# Patient Record
Sex: Male | Born: 1960 | Race: White | Hispanic: No | Marital: Married | Smoking: Never smoker
Health system: Southern US, Community
[De-identification: ages and names within clinical notes are randomized; demographics above are authoritative.]

## PROBLEM LIST (undated history)

## (undated) DIAGNOSIS — K760 Fatty (change of) liver, not elsewhere classified: Secondary | ICD-10-CM

## (undated) DIAGNOSIS — E785 Hyperlipidemia, unspecified: Secondary | ICD-10-CM

## (undated) DIAGNOSIS — K219 Gastro-esophageal reflux disease without esophagitis: Secondary | ICD-10-CM

## (undated) DIAGNOSIS — F32A Depression, unspecified: Secondary | ICD-10-CM

## (undated) DIAGNOSIS — T7840XA Allergy, unspecified, initial encounter: Secondary | ICD-10-CM

## (undated) DIAGNOSIS — K649 Unspecified hemorrhoids: Secondary | ICD-10-CM

## (undated) DIAGNOSIS — J302 Other seasonal allergic rhinitis: Secondary | ICD-10-CM

## (undated) DIAGNOSIS — R945 Abnormal results of liver function studies: Secondary | ICD-10-CM

## (undated) DIAGNOSIS — I10 Essential (primary) hypertension: Secondary | ICD-10-CM

## (undated) DIAGNOSIS — F329 Major depressive disorder, single episode, unspecified: Secondary | ICD-10-CM

## (undated) DIAGNOSIS — E119 Type 2 diabetes mellitus without complications: Secondary | ICD-10-CM

## (undated) DIAGNOSIS — K449 Diaphragmatic hernia without obstruction or gangrene: Secondary | ICD-10-CM

## (undated) HISTORY — DX: Fatty (change of) liver, not elsewhere classified: K76.0

## (undated) HISTORY — DX: Gastro-esophageal reflux disease without esophagitis: K21.9

## (undated) HISTORY — DX: Other seasonal allergic rhinitis: J30.2

## (undated) HISTORY — DX: Hyperlipidemia, unspecified: E78.5

## (undated) HISTORY — DX: Type 2 diabetes mellitus without complications: E11.9

## (undated) HISTORY — DX: Diaphragmatic hernia without obstruction or gangrene: K44.9

## (undated) HISTORY — DX: Unspecified hemorrhoids: K64.9

## (undated) HISTORY — DX: Allergy, unspecified, initial encounter: T78.40XA

## (undated) HISTORY — DX: Abnormal results of liver function studies: R94.5

## (undated) HISTORY — DX: Depression, unspecified: F32.A

## (undated) HISTORY — PX: APPENDECTOMY: SHX54

## (undated) HISTORY — DX: Essential (primary) hypertension: I10

## (undated) HISTORY — DX: Major depressive disorder, single episode, unspecified: F32.9

---

## 2000-10-08 ENCOUNTER — Emergency Department (HOSPITAL_COMMUNITY): Admission: EM | Admit: 2000-10-08 | Discharge: 2000-10-09 | Payer: Self-pay | Admitting: Emergency Medicine

## 2000-10-09 ENCOUNTER — Emergency Department (HOSPITAL_COMMUNITY): Admission: EM | Admit: 2000-10-09 | Discharge: 2000-10-09 | Payer: Self-pay | Admitting: Emergency Medicine

## 2000-10-10 ENCOUNTER — Emergency Department (HOSPITAL_COMMUNITY): Admission: EM | Admit: 2000-10-10 | Discharge: 2000-10-10 | Payer: Self-pay | Admitting: Emergency Medicine

## 2000-10-13 ENCOUNTER — Ambulatory Visit (HOSPITAL_BASED_OUTPATIENT_CLINIC_OR_DEPARTMENT_OTHER): Admission: RE | Admit: 2000-10-13 | Discharge: 2000-10-13 | Payer: Self-pay | Admitting: Otolaryngology

## 2000-10-25 ENCOUNTER — Emergency Department (HOSPITAL_COMMUNITY): Admission: EM | Admit: 2000-10-25 | Discharge: 2000-10-25 | Payer: Self-pay | Admitting: Emergency Medicine

## 2002-11-15 ENCOUNTER — Encounter: Payer: Self-pay | Admitting: General Surgery

## 2002-11-15 ENCOUNTER — Inpatient Hospital Stay (HOSPITAL_COMMUNITY): Admission: EM | Admit: 2002-11-15 | Discharge: 2002-11-16 | Payer: Self-pay | Admitting: Emergency Medicine

## 2002-11-15 ENCOUNTER — Encounter (INDEPENDENT_AMBULATORY_CARE_PROVIDER_SITE_OTHER): Payer: Self-pay

## 2004-10-10 ENCOUNTER — Ambulatory Visit: Payer: Self-pay | Admitting: Internal Medicine

## 2005-03-28 ENCOUNTER — Ambulatory Visit: Payer: Self-pay | Admitting: Internal Medicine

## 2005-09-03 ENCOUNTER — Ambulatory Visit: Payer: Self-pay | Admitting: Cardiology

## 2005-09-03 ENCOUNTER — Ambulatory Visit: Payer: Self-pay | Admitting: Internal Medicine

## 2005-11-04 ENCOUNTER — Ambulatory Visit: Payer: Self-pay | Admitting: Internal Medicine

## 2005-11-04 LAB — CONVERTED CEMR LAB: PSA: 0.88 ng/mL

## 2006-10-17 ENCOUNTER — Encounter: Payer: Self-pay | Admitting: Internal Medicine

## 2006-10-17 DIAGNOSIS — L0291 Cutaneous abscess, unspecified: Secondary | ICD-10-CM | POA: Insufficient documentation

## 2006-10-17 DIAGNOSIS — Z9109 Other allergy status, other than to drugs and biological substances: Secondary | ICD-10-CM | POA: Insufficient documentation

## 2006-10-17 DIAGNOSIS — M109 Gout, unspecified: Secondary | ICD-10-CM | POA: Insufficient documentation

## 2006-10-17 DIAGNOSIS — F329 Major depressive disorder, single episode, unspecified: Secondary | ICD-10-CM | POA: Insufficient documentation

## 2006-10-17 DIAGNOSIS — L0390 Cellulitis, unspecified: Secondary | ICD-10-CM

## 2006-10-21 DIAGNOSIS — E785 Hyperlipidemia, unspecified: Secondary | ICD-10-CM

## 2006-10-21 DIAGNOSIS — E782 Mixed hyperlipidemia: Secondary | ICD-10-CM | POA: Insufficient documentation

## 2006-11-27 ENCOUNTER — Encounter: Payer: Self-pay | Admitting: Internal Medicine

## 2006-11-27 ENCOUNTER — Ambulatory Visit: Payer: Self-pay | Admitting: Internal Medicine

## 2006-11-27 DIAGNOSIS — J309 Allergic rhinitis, unspecified: Secondary | ICD-10-CM | POA: Insufficient documentation

## 2006-11-27 LAB — CONVERTED CEMR LAB
ALT: 62 units/L — ABNORMAL HIGH (ref 0–53)
AST: 37 units/L (ref 0–37)
Albumin: 4 g/dL (ref 3.5–5.2)
Alkaline Phosphatase: 58 units/L (ref 39–117)
BUN: 13 mg/dL (ref 6–23)
Basophils Absolute: 0 10*3/uL (ref 0.0–0.1)
Basophils Relative: 0.9 % (ref 0.0–1.0)
Bilirubin, Direct: 0.2 mg/dL (ref 0.0–0.3)
CO2: 28 meq/L (ref 19–32)
Calcium: 9.1 mg/dL (ref 8.4–10.5)
Chloride: 112 meq/L (ref 96–112)
Cholesterol: 145 mg/dL (ref 0–200)
Creatinine, Ser: 0.8 mg/dL (ref 0.4–1.5)
Direct LDL: 73.9 mg/dL
Eosinophils Absolute: 0.1 10*3/uL (ref 0.0–0.6)
Eosinophils Relative: 2.1 % (ref 0.0–5.0)
GFR calc Af Amer: 135 mL/min
GFR calc non Af Amer: 112 mL/min
Glucose, Bld: 118 mg/dL — ABNORMAL HIGH (ref 70–99)
HCT: 42.2 % (ref 39.0–52.0)
HDL: 37.2 mg/dL — ABNORMAL LOW (ref >39.0)
Hemoglobin, Urine: NEGATIVE
Hemoglobin: 14.7 g/dL (ref 13.0–17.0)
Hgb A1c MFr Bld: 6.1 % — ABNORMAL HIGH (ref 4.6–6.0)
Leukocytes, UA: NEGATIVE
Lymphocytes Relative: 45.8 % (ref 12.0–46.0)
MCHC: 34.7 g/dL (ref 30.0–36.0)
MCV: 89.3 fL (ref 78.0–100.0)
Monocytes Absolute: 0.4 10*3/uL (ref 0.2–0.7)
Monocytes Relative: 9.8 % (ref 3.0–11.0)
Neutro Abs: 1.7 10*3/uL (ref 1.4–7.7)
Neutrophils Relative %: 41.4 % — ABNORMAL LOW (ref 43.0–77.0)
Nitrite: NEGATIVE
PSA: 0.85 ng/mL (ref 0.10–4.00)
Platelets: 257 10*3/uL (ref 150–400)
Potassium: 4.3 meq/L (ref 3.5–5.1)
RBC: 4.72 M/uL (ref 4.22–5.81)
RDW: 12.2 % (ref 11.5–14.6)
Sodium: 145 meq/L (ref 135–145)
Specific Gravity, Urine: 1.025 (ref 1.000–1.03)
TSH: 2.17 microintl units/mL (ref 0.35–5.50)
Total Bilirubin: 0.6 mg/dL (ref 0.3–1.2)
Total CHOL/HDL Ratio: 3.9
Total Protein: 6.4 g/dL (ref 6.0–8.3)
Triglycerides: 224 mg/dL (ref 0–149)
Urine Glucose: NEGATIVE mg/dL
Urobilinogen, UA: 0.2 (ref 0.0–1.0)
VLDL: 45 mg/dL — ABNORMAL HIGH (ref 0–40)
WBC: 4.2 10*3/uL — ABNORMAL LOW (ref 4.5–10.5)
pH: 6 (ref 5.0–8.0)

## 2006-11-30 DIAGNOSIS — E119 Type 2 diabetes mellitus without complications: Secondary | ICD-10-CM | POA: Insufficient documentation

## 2007-02-11 HISTORY — PX: UMBILICAL HERNIA REPAIR: SHX196

## 2007-03-15 ENCOUNTER — Encounter: Payer: Self-pay | Admitting: Internal Medicine

## 2007-05-14 ENCOUNTER — Ambulatory Visit: Payer: Self-pay | Admitting: Internal Medicine

## 2007-05-14 LAB — CONVERTED CEMR LAB
BUN: 14 mg/dL (ref 6–23)
CO2: 29 meq/L (ref 19–32)
Calcium: 9 mg/dL (ref 8.4–10.5)
Chloride: 110 meq/L (ref 96–112)
Cholesterol: 118 mg/dL (ref 0–200)
Creatinine, Ser: 0.8 mg/dL (ref 0.4–1.5)
GFR calc Af Amer: 135 mL/min
GFR calc non Af Amer: 112 mL/min
Glucose, Bld: 98 mg/dL (ref 70–99)
HDL: 36.8 mg/dL — ABNORMAL LOW (ref >39.0)
Hgb A1c MFr Bld: 6 % (ref 4.6–6.0)
LDL Cholesterol: 60 mg/dL (ref 0–99)
Potassium: 4.4 meq/L (ref 3.5–5.1)
Sodium: 142 meq/L (ref 135–145)
Total CHOL/HDL Ratio: 3.2
Triglycerides: 104 mg/dL (ref 0–149)
VLDL: 21 mg/dL (ref 0–40)

## 2007-11-26 ENCOUNTER — Telehealth (INDEPENDENT_AMBULATORY_CARE_PROVIDER_SITE_OTHER): Payer: Self-pay | Admitting: *Deleted

## 2007-12-08 ENCOUNTER — Ambulatory Visit: Payer: Self-pay | Admitting: Internal Medicine

## 2007-12-13 ENCOUNTER — Ambulatory Visit: Payer: Self-pay | Admitting: Internal Medicine

## 2007-12-13 DIAGNOSIS — R1032 Left lower quadrant pain: Secondary | ICD-10-CM | POA: Insufficient documentation

## 2007-12-13 DIAGNOSIS — K4090 Unilateral inguinal hernia, without obstruction or gangrene, not specified as recurrent: Secondary | ICD-10-CM | POA: Insufficient documentation

## 2008-02-11 HISTORY — PX: NASAL SEPTUM SURGERY: SHX37

## 2008-05-30 ENCOUNTER — Encounter: Payer: Self-pay | Admitting: Internal Medicine

## 2008-10-12 ENCOUNTER — Telehealth: Payer: Self-pay | Admitting: Internal Medicine

## 2008-12-27 ENCOUNTER — Ambulatory Visit: Payer: Self-pay | Admitting: Internal Medicine

## 2008-12-28 ENCOUNTER — Encounter: Payer: Self-pay | Admitting: Internal Medicine

## 2008-12-28 LAB — CONVERTED CEMR LAB
ALT: 84 units/L — ABNORMAL HIGH (ref 0–53)
AST: 54 units/L — ABNORMAL HIGH (ref 0–37)
Albumin: 4.1 g/dL (ref 3.5–5.2)
Alkaline Phosphatase: 55 units/L (ref 39–117)
BUN: 17 mg/dL (ref 6–23)
Basophils Absolute: 0 10*3/uL (ref 0.0–0.1)
Basophils Relative: 1 % (ref 0.0–3.0)
Bilirubin Urine: NEGATIVE
Bilirubin, Direct: 0.1 mg/dL (ref 0.0–0.3)
CO2: 27 meq/L (ref 19–32)
Calcium: 9.1 mg/dL (ref 8.4–10.5)
Chloride: 107 meq/L (ref 96–112)
Cholesterol: 152 mg/dL (ref 0–200)
Creatinine, Ser: 0.8 mg/dL (ref 0.4–1.5)
Eosinophils Absolute: 0.1 10*3/uL (ref 0.0–0.7)
Eosinophils Relative: 2 % (ref 0.0–5.0)
GFR calc non Af Amer: 110.42 mL/min (ref >60)
Glucose, Bld: 98 mg/dL (ref 70–99)
HCT: 44 % (ref 39.0–52.0)
HDL: 41 mg/dL (ref >39.00)
Hemoglobin, Urine: NEGATIVE
Hemoglobin: 14.9 g/dL (ref 13.0–17.0)
Ketones, ur: NEGATIVE mg/dL
LDL Cholesterol: 87 mg/dL (ref 0–99)
Leukocytes, UA: NEGATIVE
Lymphocytes Relative: 39.9 % (ref 12.0–46.0)
Lymphs Abs: 1.9 10*3/uL (ref 0.7–4.0)
MCHC: 33.8 g/dL (ref 30.0–36.0)
MCV: 92.5 fL (ref 78.0–100.0)
Monocytes Absolute: 0.5 10*3/uL (ref 0.1–1.0)
Monocytes Relative: 10.7 % (ref 3.0–12.0)
Neutro Abs: 2.2 10*3/uL (ref 1.4–7.7)
Neutrophils Relative %: 46.4 % (ref 43.0–77.0)
Nitrite: NEGATIVE
PSA: 0.59 ng/mL (ref 0.10–4.00)
Platelets: 216 10*3/uL (ref 150.0–400.0)
Potassium: 4.2 meq/L (ref 3.5–5.1)
RBC: 4.75 M/uL (ref 4.22–5.81)
RDW: 12.7 % (ref 11.5–14.6)
Sodium: 142 meq/L (ref 135–145)
Specific Gravity, Urine: 1.025 (ref 1.000–1.030)
TSH: 3.02 microintl units/mL (ref 0.35–5.50)
Total Bilirubin: 0.9 mg/dL (ref 0.3–1.2)
Total CHOL/HDL Ratio: 4
Total Protein, Urine: NEGATIVE mg/dL
Total Protein: 6.5 g/dL (ref 6.0–8.3)
Triglycerides: 119 mg/dL (ref 0.0–149.0)
Urine Glucose: NEGATIVE mg/dL
Urobilinogen, UA: 0.2 (ref 0.0–1.0)
VLDL: 23.8 mg/dL (ref 0.0–40.0)
WBC: 4.7 10*3/uL (ref 4.5–10.5)
pH: 5.5 (ref 5.0–8.0)

## 2008-12-29 ENCOUNTER — Ambulatory Visit: Payer: Self-pay | Admitting: Internal Medicine

## 2008-12-29 LAB — CONVERTED CEMR LAB
HCV Ab: NEGATIVE
Hep A IgM: NEGATIVE
Hep B C IgM: NEGATIVE
Hepatitis B Surface Ag: NEGATIVE

## 2009-03-20 ENCOUNTER — Encounter: Payer: Self-pay | Admitting: Internal Medicine

## 2009-06-29 ENCOUNTER — Telehealth: Payer: Self-pay | Admitting: Internal Medicine

## 2009-10-22 ENCOUNTER — Ambulatory Visit: Payer: Self-pay | Admitting: Internal Medicine

## 2009-10-22 DIAGNOSIS — R109 Unspecified abdominal pain: Secondary | ICD-10-CM | POA: Insufficient documentation

## 2009-10-22 DIAGNOSIS — K219 Gastro-esophageal reflux disease without esophagitis: Secondary | ICD-10-CM | POA: Insufficient documentation

## 2009-10-23 ENCOUNTER — Telehealth: Payer: Self-pay | Admitting: Internal Medicine

## 2009-10-23 LAB — CONVERTED CEMR LAB
Bilirubin Urine: NEGATIVE
Hemoglobin, Urine: NEGATIVE
Ketones, ur: NEGATIVE mg/dL
Nitrite: NEGATIVE
Specific Gravity, Urine: 1.02 (ref 1.000–1.030)
Total Protein, Urine: NEGATIVE mg/dL
Urine Glucose: NEGATIVE mg/dL
Urobilinogen, UA: 0.2 (ref 0.0–1.0)
pH: 6 (ref 5.0–8.0)

## 2009-12-19 ENCOUNTER — Encounter: Payer: Self-pay | Admitting: Internal Medicine

## 2009-12-24 ENCOUNTER — Encounter: Payer: Self-pay | Admitting: Gastroenterology

## 2009-12-26 ENCOUNTER — Encounter: Payer: Self-pay | Admitting: Internal Medicine

## 2010-03-10 LAB — CONVERTED CEMR LAB
ALT: 49 units/L (ref 0–53)
AST: 33 units/L (ref 0–37)
Albumin: 4 g/dL (ref 3.5–5.2)
Alkaline Phosphatase: 56 units/L (ref 39–117)
BUN: 18 mg/dL (ref 6–23)
Basophils Absolute: 0 10*3/uL (ref 0.0–0.1)
Basophils Relative: 0.4 % (ref 0.0–3.0)
Bilirubin Urine: NEGATIVE
Bilirubin, Direct: 0.1 mg/dL (ref 0.0–0.3)
CO2: 28 meq/L (ref 19–32)
Calcium: 8.9 mg/dL (ref 8.4–10.5)
Chloride: 109 meq/L (ref 96–112)
Cholesterol: 177 mg/dL (ref 0–200)
Creatinine, Ser: 0.8 mg/dL (ref 0.4–1.5)
Creatinine,U: 309.8 mg/dL
Eosinophils Absolute: 0.1 10*3/uL (ref 0.0–0.7)
Eosinophils Relative: 2.2 % (ref 0.0–5.0)
GFR calc Af Amer: 134 mL/min
GFR calc non Af Amer: 111 mL/min
Glucose, Bld: 99 mg/dL (ref 70–99)
HCT: 41.4 % (ref 39.0–52.0)
HDL: 37.7 mg/dL — ABNORMAL LOW (ref >39.0)
Hemoglobin, Urine: NEGATIVE
Hemoglobin: 14.3 g/dL (ref 13.0–17.0)
Hgb A1c MFr Bld: 5.9 % (ref 4.6–6.0)
Ketones, ur: NEGATIVE mg/dL
LDL Cholesterol: 115 mg/dL — ABNORMAL HIGH (ref 0–99)
Leukocytes, UA: NEGATIVE
Lymphocytes Relative: 48.2 % — ABNORMAL HIGH (ref 12.0–46.0)
MCHC: 34.6 g/dL (ref 30.0–36.0)
MCV: 90.2 fL (ref 78.0–100.0)
Microalb Creat Ratio: 2.9 mg/g (ref 0.0–30.0)
Microalb, Ur: 0.9 mg/dL (ref 0.0–1.9)
Monocytes Absolute: 0.5 10*3/uL (ref 0.1–1.0)
Monocytes Relative: 9.7 % (ref 3.0–12.0)
Neutro Abs: 1.9 10*3/uL (ref 1.4–7.7)
Neutrophils Relative %: 39.5 % — ABNORMAL LOW (ref 43.0–77.0)
Nitrite: NEGATIVE
PSA: 0.75 ng/mL (ref 0.10–4.00)
Platelets: 217 10*3/uL (ref 150–400)
Potassium: 4 meq/L (ref 3.5–5.1)
RBC: 4.59 M/uL (ref 4.22–5.81)
RDW: 12.1 % (ref 11.5–14.6)
Sodium: 143 meq/L (ref 135–145)
Specific Gravity, Urine: >=1.03 (ref 1.000–1.03)
TSH: 3.2 microintl units/mL (ref 0.35–5.50)
Total Bilirubin: 0.8 mg/dL (ref 0.3–1.2)
Total CHOL/HDL Ratio: 4.7
Total Protein, Urine: NEGATIVE mg/dL
Total Protein: 6.3 g/dL (ref 6.0–8.3)
Triglycerides: 122 mg/dL (ref 0–149)
Urine Glucose: NEGATIVE mg/dL
Urobilinogen, UA: 0.2 (ref 0.0–1.0)
VLDL: 24 mg/dL (ref 0–40)
WBC: 4.9 10*3/uL (ref 4.5–10.5)
pH: 6 (ref 5.0–8.0)

## 2010-03-12 NOTE — Progress Notes (Signed)
Summary: Zolpidem  Phone Note Refill Request Message from:  Fax from Pharmacy on Jun 29, 2009 10:52 AM  Refills Requested: Medication #1:  ZOLPIDEM TARTRATE 10 MG  TABS 1 by mouth at bedtime as needed. # 30   Last Refilled: 03/31/2009 [facility] [phone] LAst ov 12/29/08  Next Appointment Scheduled: none Initial call taken by: Orlan Leavens,  Jun 29, 2009 10:52 AM  Follow-up for Phone Call        Is this ok to renew since Dr. Jonny Ruiz out of office Follow-up by: Orlan Leavens,  Jun 29, 2009 10:53 AM  Additional Follow-up for Phone Call Additional follow up Details #1::        yes - may fill as requested - thanks Additional Follow-up by: Newt Lukes MD,  Jun 29, 2009 11:00 AM    Additional Follow-up for Phone Call Additional follow up Details #2::    Faxed back paper req ok # 30 with 3 addtional refills Follow-up by: Orlan Leavens,  Jun 29, 2009 11:03 AM  Prescriptions: ZOLPIDEM TARTRATE 10 MG  TABS (ZOLPIDEM TARTRATE) 1 by mouth at bedtime as needed  #30 x 3   Entered by:   Orlan Leavens   Authorized by:   Newt Lukes MD   Signed by:   Orlan Leavens on 06/29/2009   Method used:   Telephoned to ...       [facility] (retail)       [address]       [place], [place]  [address]       Ph: [phone] or [phone]       Fax: [phone]   RxID:   [id number]

## 2010-03-12 NOTE — Consult Note (Signed)
Summary: [facility]  [facility]   Imported By: Lester [place] 12/24/2009 10:50:31  _____________________________________________________________________  External Attachment:    Type:   Image     Comment:   External Document

## 2010-03-12 NOTE — Assessment & Plan Note (Signed)
Summary: ? digestive issue/cd   Vital Signs:  Patient profile:   50 year old male Height:      69 inches Weight:      229.25 pounds BMI:     33.98 O2 Sat:      95 % on Room air Temp:     97.7 degrees F oral Pulse rate:   72 / minute BP sitting:   142 / 80  (left arm) Cuff size:   large  Vitals Entered By: Zella Ball Ewing CMA Duncan Dull) (October 22, 2009 4:24 PM)  O2 Flow:  Room air CC: Abdominal pain, BM change/RE   CC:  Abdominal pain and BM change/RE.  History of Present Illness: here to f/u with abd discomfort - generalized , intermiitent ot start and now more constant, mild to  mod, less freq BM (down from 2 usual in the am) and change in caliber of stool to thinner) - overall started x 3 -4 months,  no vomiting, or wt loss; no blood; has gained some wt but wondering about abd distension;  does radiate to the back;  but no fever diarrhea.  ? under more stress lately but no incr panic or depressive symptoms.   Pain not worse to eat but has feeling of fullness.  Pt denies CP, worsening sob, doe, wheezing, orthopnea, pnd, worsening LE edema, palps, dizziness or syncope  No fever, wt loss, night sweats, loss of appetite or other constitutional symptoms  OTC antacids might help somewhat - yesterday took tums and zantac and helped. Denies polydipsia, polyuria.  No overt GU symtpoms such as dysuria, freq, urgency or hematuria.    Problems Prior to Update: 1)  Abdominal Pain, Unspecified Site  (ICD-789.00) 2)  Inguinal Hernia, Left  (ICD-550.90) 3)  Inguinal Pain, Left  (ICD-789.09) 4)  Preventive Health Care  (ICD-V70.0) 5)  Diabetes Mellitus, Type II  (ICD-250.00) 6)  Preventive Health Care  (ICD-V70.0) 7)  Family History Diabetes 1st Degree Relative  (ICD-V18.0) 8)  Family History of Cad Male 1st Degree Relative <50  (ICD-V17.3) 9)  Allergic Rhinitis  (ICD-477.9) 10)  Hyperlipidemia  (ICD-272.4) 11)  Gout, Foot  (ICD-274.0) 12)  Cellulitis  (ICD-682.9) 13)  Depression  (ICD-311) 14)   Allergy, Hx of  (ICD-V15.09)  Medications Prior to Update: 1)  Ibuprofen 800 Mg Tabs (Ibuprofen) .Marland Kitchen.. 1 By Mouth Two Times A Day As Needed Pain 2)  Lovastatin 40 Mg  Tabs (Lovastatin) .Marland Kitchen.. 1 By Mouth Once Daily 3)  Ecotrin Low Strength 81 Mg  Tbec (Aspirin) .Marland Kitchen.. 1 By Mouth Qd 4)  Allopurinol 300 Mg  Tabs (Allopurinol) .Marland Kitchen.. 1 By Mouth Every Other Day 5)  Zolpidem Tartrate 10 Mg  Tabs (Zolpidem Tartrate) .Marland Kitchen.. 1 By Mouth At Bedtime As Needed  Current Medications (verified): 1)  Ibuprofen 800 Mg Tabs (Ibuprofen) .Marland Kitchen.. 1 By Mouth Two Times A Day As Needed Pain 2)  Lovastatin 40 Mg  Tabs (Lovastatin) .Marland Kitchen.. 1 By Mouth Once Daily 3)  Ecotrin Low Strength 81 Mg  Tbec (Aspirin) .Marland Kitchen.. 1 By Mouth Qd 4)  Allopurinol 300 Mg  Tabs (Allopurinol) .Marland Kitchen.. 1 By Mouth Every Other Day 5)  Zolpidem Tartrate 10 Mg  Tabs (Zolpidem Tartrate) .Marland Kitchen.. 1 By Mouth At Bedtime As Needed 6)  Omeprazole 20 Mg Cpdr (Omeprazole) .Marland Kitchen.. 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   Head:  normocephalic and atraumatic.   Eyes:  vision grossly intact, pupils equal, and pupils round.   Ears:  R ear normal and L ear normal.   Nose:  no external deformity and no nasal discharge.   Mouth:  no gingival abnormalities and pharynx pink and moist.   Neck:  supple and no masses.   Lungs:  normal respiratory effort and normal breath sounds.    Heart:  normal rate and regular rhythm.   Abdomen:  soft and normal bowel sounds, with very mild tender to deep palpatiaon left side and LLQ, no guarding or rebound Extremities:  no edema, no erythema    Impression & Recommendations:  Problem # 1:  ABDOMINAL PAIN, UNSPECIFIED SITE (ICD-789.00)  left mid side and llq , with subjective change in stool caliber;  ? etiology - ? msk or mesh related from previous umblical hernia repair +/- functional +/- reflux,   to check urine stuides, for prilosec ; consider ct, labs and gi consult if not improving next 1-2 wks   Orders: T-Culture, Urine ([id number]) TLB-Udip w/ Micro (81001-URINE)  Problem # 2:  DIABETES MELLITUS, TYPE II (ICD-250.00)  His updated medication list for this problem includes:    Ecotrin Low Strength 81 Mg Tbec (Aspirin) .Marland Kitchen... 1 by mouth qd  Labs Reviewed: Creat: 0.8 (12/27/2008)    Reviewed HgBA1c results: 5.9 (12/08/2007)  6.0 (05/14/2007) stable overall by hx and exam, ok to continue meds/tx as is , doubt gastroparesis with above  Problem # 3:  GERD (ICD-530.81)  His updated medication list for this problem includes:    Omeprazole 20 Mg Cpdr (Omeprazole) .Marland Kitchen... 1 by mouth two times a day treat as above, f/u any worsening signs or symptoms   Complete Medication List: 1)  Ibuprofen 800 Mg Tabs (Ibuprofen) .Marland Kitchen.. 1 by mouth two times a day as needed pain 2)  Lovastatin 40 Mg Tabs (Lovastatin) .Marland Kitchen.. 1 by mouth once daily 3)  Ecotrin Low Strength 81 Mg Tbec (Aspirin) .Marland Kitchen.. 1 by mouth qd 4)  Allopurinol 300 Mg Tabs (Allopurinol) .Marland Kitchen.. 1 by mouth every other day 5)  Zolpidem Tartrate 10 Mg Tabs (Zolpidem tartrate) .Marland Kitchen.. 1 by mouth at bedtime as needed 6)  Omeprazole 20 Mg Cpdr (Omeprazole) .Marland Kitchen.. 1 by mouth two times a day 7)  Ciprofloxacin Hcl 500 Mg Tabs (Ciprofloxacin hcl) .Marland Kitchen.. 1 by mouth two times a day  Patient Instructions: 1)  Please take all new medications as prescribed' 2)  Please go to the Lab in the basement  for your blood and/or urine tests today 3)  Please call if not improved in the next 1-2 wks for GI referral 4)  Please schedule a follow-up appointment as needed. Prescriptions: OMEPRAZOLE 20 MG CPDR (OMEPRAZOLE) 1 by mouth two times a day  #60 x 11   Entered and Authorized by:   Corwin Levins MD   Signed by:   Corwin Levins MD on 10/22/2009   Method used:   Print then Give to Patient   RxID:   [id number]

## 2010-03-12 NOTE — Progress Notes (Signed)
  Phone Note Refill Request Message from:  Patient on October 23, 2009 1:02 PM  Refills Requested: Medication #1:  CIPROFLOXACIN HCL 500 MG TABS 1 by mouth two times a day. Initial call taken by: Robin Ewing CMA (AAMA),  October 23, 2009 1:02 PM    Prescriptions: CIPROFLOXACIN HCL 500 MG TABS (CIPROFLOXACIN HCL) 1 by mouth two times a day  #20 x 0   Entered by:   Scharlene Gloss CMA (AAMA)   Authorized by:   Corwin Levins MD   Signed by:   Scharlene Gloss CMA (AAMA) on 10/23/2009   Method used:   Print then Give to Patient   RxID:   [id number]   Appended Document:  Patient called and requested cipro to be faxed to [facility] in [place], [place] as he is out of town. Faxed hardcopy to [phone]. will call patient and inform prescription sent in.

## 2010-03-12 NOTE — Letter (Signed)
Summary: Stacey Drain MD  Stacey Drain MD   Imported By: Sherian Rein 03/27/2009 08:20:00  _____________________________________________________________________  External Attachment:    Type:   Image     Comment:   External Document

## 2010-03-12 NOTE — Letter (Signed)
Summary: [facility]  [facility]   Imported By: Lester [place]marejo 01/01/2010 08:31:08  _____________________________________________________________________  External Attachment:    Type:   Image     Comment:   External Document

## 2010-03-18 ENCOUNTER — Ambulatory Visit (INDEPENDENT_AMBULATORY_CARE_PROVIDER_SITE_OTHER): Payer: BC Managed Care – PPO | Admitting: Internal Medicine

## 2010-03-18 ENCOUNTER — Encounter: Payer: Self-pay | Admitting: Internal Medicine

## 2010-03-18 ENCOUNTER — Other Ambulatory Visit: Payer: BC Managed Care – PPO

## 2010-03-18 ENCOUNTER — Other Ambulatory Visit: Payer: Self-pay | Admitting: Internal Medicine

## 2010-03-18 DIAGNOSIS — R1032 Left lower quadrant pain: Secondary | ICD-10-CM | POA: Insufficient documentation

## 2010-03-18 DIAGNOSIS — E119 Type 2 diabetes mellitus without complications: Secondary | ICD-10-CM

## 2010-03-18 DIAGNOSIS — K219 Gastro-esophageal reflux disease without esophagitis: Secondary | ICD-10-CM

## 2010-03-18 LAB — URINALYSIS, ROUTINE W REFLEX MICROSCOPIC
Hgb urine dipstick: NEGATIVE
Ketones, ur: NEGATIVE
Urine Glucose: NEGATIVE
Urobilinogen, UA: 0.2 (ref 0.0–1.0)

## 2010-03-19 ENCOUNTER — Encounter: Payer: Self-pay | Admitting: Internal Medicine

## 2010-03-20 ENCOUNTER — Encounter: Payer: Self-pay | Admitting: Internal Medicine

## 2010-03-20 ENCOUNTER — Encounter (INDEPENDENT_AMBULATORY_CARE_PROVIDER_SITE_OTHER): Payer: Self-pay | Admitting: *Deleted

## 2010-03-26 ENCOUNTER — Encounter (INDEPENDENT_AMBULATORY_CARE_PROVIDER_SITE_OTHER): Payer: Self-pay | Admitting: *Deleted

## 2010-03-26 ENCOUNTER — Other Ambulatory Visit: Payer: Self-pay | Admitting: Gastroenterology

## 2010-03-26 ENCOUNTER — Encounter: Payer: Self-pay | Admitting: Gastroenterology

## 2010-03-26 ENCOUNTER — Ambulatory Visit (INDEPENDENT_AMBULATORY_CARE_PROVIDER_SITE_OTHER): Payer: BC Managed Care – PPO | Admitting: Gastroenterology

## 2010-03-26 ENCOUNTER — Other Ambulatory Visit: Payer: BC Managed Care – PPO

## 2010-03-26 DIAGNOSIS — R1032 Left lower quadrant pain: Secondary | ICD-10-CM

## 2010-03-26 DIAGNOSIS — R740 Nonspecific elevation of levels of transaminase and lactic acid dehydrogenase [LDH]: Secondary | ICD-10-CM

## 2010-03-26 DIAGNOSIS — R7401 Elevation of levels of liver transaminase levels: Secondary | ICD-10-CM | POA: Insufficient documentation

## 2010-03-26 DIAGNOSIS — K219 Gastro-esophageal reflux disease without esophagitis: Secondary | ICD-10-CM

## 2010-03-26 LAB — CBC WITH DIFFERENTIAL/PLATELET
Basophils Absolute: 0 10*3/uL (ref 0.0–0.1)
Basophils Relative: 0.5 % (ref 0.0–3.0)
HCT: 42.7 % (ref 39.0–52.0)
Lymphs Abs: 1.7 10*3/uL (ref 0.7–4.0)
MCV: 90 fl (ref 78.0–100.0)
Monocytes Absolute: 0.6 10*3/uL (ref 0.1–1.0)
Monocytes Relative: 12.1 % — ABNORMAL HIGH (ref 3.0–12.0)
Neutro Abs: 2.3 10*3/uL (ref 1.4–7.7)
Platelets: 200 10*3/uL (ref 150.0–400.0)
RBC: 4.74 Mil/uL (ref 4.22–5.81)
RDW: 12.9 % (ref 11.5–14.6)

## 2010-03-26 LAB — BASIC METABOLIC PANEL
Calcium: 9.3 mg/dL (ref 8.4–10.5)
Chloride: 105 mEq/L (ref 96–112)
GFR: 106.74 mL/min (ref >60.00)
Glucose, Bld: 105 mg/dL — ABNORMAL HIGH (ref 70–99)
Sodium: 139 mEq/L (ref 135–145)

## 2010-03-26 LAB — HEPATIC FUNCTION PANEL
ALT: 137 U/L — ABNORMAL HIGH (ref 0–53)
AST: 67 U/L — ABNORMAL HIGH (ref 0–37)
Albumin: 4 g/dL (ref 3.5–5.2)
Alkaline Phosphatase: 55 U/L (ref 39–117)
Bilirubin, Direct: 0.1 mg/dL (ref 0.0–0.3)
Total Bilirubin: 0.8 mg/dL (ref 0.3–1.2)
Total Protein: 6.2 g/dL (ref 6.0–8.3)

## 2010-03-26 LAB — LIPASE: Lipase: 30 U/L (ref 11.0–59.0)

## 2010-03-26 LAB — VITAMIN B12: Vitamin B-12: 524 pg/mL (ref 211–911)

## 2010-03-26 LAB — HIGH SENSITIVITY CRP: CRP, High Sensitivity: 0.85 mg/L (ref 0.00–5.00)

## 2010-03-26 LAB — FERRITIN: Ferritin: 81.4 ng/mL (ref 22.0–322.0)

## 2010-03-26 LAB — FOLATE: Folate: 14.1 ng/mL

## 2010-03-26 LAB — SEDIMENTATION RATE: Sed Rate: 5 mm/hr (ref 0–22)

## 2010-03-27 ENCOUNTER — Other Ambulatory Visit: Payer: Self-pay | Admitting: Gastroenterology

## 2010-03-27 DIAGNOSIS — R945 Abnormal results of liver function studies: Secondary | ICD-10-CM

## 2010-03-28 NOTE — Miscellaneous (Signed)
Summary: Orders Update   Clinical Lists Changes  Orders: Added new Referral order of Gastroenterology Referral (GI) - Signed 

## 2010-03-28 NOTE — Assessment & Plan Note (Signed)
Summary: ABDOMINAL PAIN  STC   Vital Signs:  Patient profile:   50 year old male Height:      69 inches Weight:      229.38 pounds BMI:     34.00 O2 Sat:      96 % on Room air Temp:     98.1 degrees F oral Pulse rate:   71 / minute BP sitting:   126 / 82  (left arm) Cuff size:   large  Vitals Entered By: Margaret Pyle, CMA (March 18, 2010 3:50 PM)  O2 Flow:  Room air  CC: abdominal pain ? diverticulitis Comments Pt started Cipro Rx 03/17/2010   CC:  abdominal pain ? diverticulitis.  History of Present Illness: here to f/u with acute - c/o pain to the LLQ abd, simialar to several episodes previously since july 2011 ? c/w diverticulits in that exam seems consistent and has been better with cipro twice;  pt travels quite extensively for work;  is here today with 3 days similar symptoms of LLQ pain, mild to mod but tender to palpate, some radiation to the back, but not assoc with dysuria, freq, urgency, hematuria (and essentially neg GU evaluation nov 2011, and reportedly neg CT abd), and no n/v/d or blood.  No  wt loss, night sweats, loss of appetite or other constitutional symptoms .  Has felt warm but not clear about fever.  Nothing makes better or worse during the pain, which is constant , gradual onset, and gradual resolvution with the cipro.  Has not seen GI in the past, no prior colonscopy.   No recent worsening reflux symtpoms, dysphagia, wt loss. Pt denies CP, worsening sob, doe, wheezing, orthopnea, pnd, worsening LE edema, palps, dizziness or syncope  Pt denies new neuro symptoms such as headache, facial or extremity weakness  Pt denies polydipsia, polyuria  Overall good compliance with meds, trying to follow low chol  diet, wt stable, little excercise however   Preventive Screening-Counseling & Management      Drug Use:  no.    Problems Prior to Update: 1)  Abdominal Pain, Left Lower Quadrant  (ICD-789.04) 2)  Gerd  (ICD-530.81) 3)  Abdominal Pain, Unspecified  Site  (ICD-789.00) 4)  Inguinal Hernia, Left  (ICD-550.90) 5)  Inguinal Pain, Left  (ICD-789.09) 6)  Preventive Health Care  (ICD-V70.0) 7)  Diabetes Mellitus, Type II  (ICD-250.00) 8)  Preventive Health Care  (ICD-V70.0) 9)  Family History Diabetes 1st Degree Relative  (ICD-V18.0) 10)  Family History of Cad Male 1st Degree Relative <50  (ICD-V17.3) 11)  Allergic Rhinitis  (ICD-477.9) 12)  Hyperlipidemia  (ICD-272.4) 13)  Gout, Foot  (ICD-274.0) 14)  Cellulitis  (ICD-682.9) 15)  Depression  (ICD-311) 16)  Allergy, Hx of  (ICD-V15.09)  Medications Prior to Update: 1)  Ibuprofen 800 Mg Tabs (Ibuprofen) .Marland Kitchen.. 1 By Mouth Two Times A Day As Needed Pain 2)  Lovastatin 40 Mg  Tabs (Lovastatin) .Marland Kitchen.. 1 By Mouth Once Daily 3)  Ecotrin Low Strength 81 Mg  Tbec (Aspirin) .Marland Kitchen.. 1 By Mouth Qd 4)  Allopurinol 300 Mg  Tabs (Allopurinol) .Marland Kitchen.. 1 By Mouth Every Other Day 5)  Zolpidem Tartrate 10 Mg  Tabs (Zolpidem Tartrate) .Marland Kitchen.. 1 By Mouth At Bedtime As Needed 6)  Omeprazole 20 Mg Cpdr (Omeprazole) .Marland Kitchen.. 1 By Mouth Two Times A Day 7)  Ciprofloxacin Hcl 500 Mg Tabs (Ciprofloxacin Hcl) .Marland Kitchen.. 1 By Mouth Two Times A Day  Current Medications (verified): 1)  Ibuprofen 800 Mg  Tabs (Ibuprofen) .Marland Kitchen.. 1 By Mouth Two Times A Day As Needed Pain 2)  Lovastatin 40 Mg  Tabs (Lovastatin) .Marland Kitchen.. 1 By Mouth Once Daily 3)  Ecotrin Low Strength 81 Mg  Tbec (Aspirin) .Marland Kitchen.. 1 By Mouth Qd 4)  Allopurinol 300 Mg  Tabs (Allopurinol) .Marland Kitchen.. 1 By Mouth Every Other Day 5)  Zolpidem Tartrate 10 Mg  Tabs (Zolpidem Tartrate) .Marland Kitchen.. 1 By Mouth At Bedtime As Needed 6)  Omeprazole 20 Mg Cpdr (Omeprazole) .Marland Kitchen.. 1 By Mouth Two Times A Day 7)  Ciprofloxacin Hcl 500 Mg Tabs (Ciprofloxacin Hcl) .Marland Kitchen.. 1 By Mouth Two Times A Day 8)  Metronidazole 250 Mg Tabs (Metronidazole) .Marland Kitchen.. 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   Eyes:  vision grossly intact, pupils equal, and pupils round.   Ears:  R ear normal and L ear normal.   Nose:  no external deformity and no nasal discharge.   Mouth:  no gingival abnormalities and pharynx pink and moist.   Neck:  supple and no masses.   Lungs:  normal respiratory effort and normal breath sounds.   Heart:  normal rate and regular rhythm.   Abdomen:  soft and normal bowel sounds.  with midl to mod LLQ tender without guarding or rebound Extremities:  no edema, no erythema    Impression & Recommendations:  Problem # 1:  ABDOMINAL PAIN, LEFT LOWER QUADRANT (ICD-789.04)  unsual hx with mult recurrence in the past 6 mo, CT reportedly neg per urology as well as prostate and urine exam;  will check urine studies today and tx empiric for diverticulitis, but if urine cx neg, would refer to GI - ? need colonscopy to further evaluate;  with recurrence so often - ? fistula  Orders: T-Culture, Urine ([id number]) TLB-Udip w/ Micro (81001-URINE)  Discussed symptom control with the patient.   Problem # 2:  GERD (ICD-530.81)  His updated medication list for this problem includes:    Omeprazole 20 Mg Cpdr (Omeprazole) .Marland Kitchen... 1 by  mouth two times a day stable overall by hx and exam, ok to continue meds/tx as is   Labs Reviewed: Hgb: 14.9 (12/27/2008)   Hct: 44.0 (12/27/2008)  Problem # 3:  DIABETES MELLITUS, TYPE II (ICD-250.00)  His updated medication list for this problem includes:    Ecotrin Low Strength 81 Mg Tbec (Aspirin) .Marland Kitchen... 1 by mouth qd  Labs Reviewed: Creat: 0.8 (12/27/2008)    Reviewed HgBA1c results: 5.9 (12/08/2007)  6.0 (05/14/2007) stable overall by hx and exam, ok to continue meds/tx as is - cont diet control  Complete Medication List: 1)  Ibuprofen 800 Mg Tabs (Ibuprofen) .Marland Kitchen.. 1 by mouth two times a day as needed pain 2)  Lovastatin 40 Mg  Tabs (Lovastatin) .Marland Kitchen.. 1 by mouth once daily 3)  Ecotrin Low Strength 81 Mg Tbec (Aspirin) .Marland Kitchen.. 1 by mouth qd 4)  Allopurinol 300 Mg Tabs (Allopurinol) .Marland Kitchen.. 1 by mouth every other day 5)  Zolpidem Tartrate 10 Mg Tabs (Zolpidem tartrate) .Marland Kitchen.. 1 by mouth at bedtime as needed 6)  Omeprazole 20 Mg Cpdr (Omeprazole) .Marland Kitchen.. 1 by mouth two times a day 7)  Ciprofloxacin Hcl 500 Mg Tabs (Ciprofloxacin hcl) .Marland Kitchen.. 1 by mouth two times a day 8)  Metronidazole 250 Mg Tabs (Metronidazole) .Marland Kitchen.. 1 by mouth qid for 10 days  Patient Instructions: 1)  Please take all new medications as prescribed 2)  Continue all previous medications as before this visit  3)  Please go to the Lab in the basement for your urine tests today  4)  Please call the number on the [facility] Card for results of your testing  5)  If the urine studies are negative (as I suspect they may be), you will need to see GI 6)  Please schedule a follow-up appointment in 6 months foir CPX with labs , or sooner if needed Prescriptions: METRONIDAZOLE 250 MG TABS (METRONIDAZOLE) 1 by mouth qid for 10 days  #40 x 0   Entered and Authorized by:   Corwin Levins MD   Signed by:   Corwin Levins MD on 03/18/2010   Method used:   Print then Give to Patient   RxID:   [id number] CIPROFLOXACIN HCL 500 MG TABS  (CIPROFLOXACIN HCL) 1 by mouth two times a day  #20 x 0   Entered and Authorized by:   Corwin Levins MD   Signed by:   Corwin Levins MD on 03/18/2010   Method used:   Print then Give to Patient   RxID:   [id number]    Orders Added: 1)  T-Culture, Urine [[id number]] 2)  TLB-Udip w/ Micro [81001-URINE] 3)  Est. Patient Level IV [40347]

## 2010-03-28 NOTE — Letter (Signed)
Summary: New Patient letter  [facility]  [address] [place], [place] [address]   Phone: [phone]  Fax: [phone]       03/20/2010 MRN: [id number]  Troy Parsons [address] [place], [place]  [address]  [place]  Dear Mr. Kowalewski,  Welcome to the Gastroenterology Division at [facility].    You are scheduled to see Dr.  Sheryn Bison on March 26, 2010 at 10:00am on the 3rd floor at [facility], [address]  We ask that you try to arrive at our office 15 minutes prior to your appointment time to allow for check-in.  We would like you to complete the enclosed self-administered evaluation form prior to your visit and bring it with you on the day of your appointment.  We will review it with you.  Also, please bring a complete list of all your medications or, if you prefer, bring the medication bottles and we will list them.  Please bring your insurance card so that we may make a copy of it.  If your insurance requires a referral to see a specialist, please bring your referral form from your primary care physician.  Co-payments are due at the time of your visit and may be paid by cash, check or credit card.     Your office visit will consist of a consult with your physician (includes a physical exam), any laboratory testing he/she may order, scheduling of any necessary diagnostic testing (e.g. x-ray, ultrasound, CT-scan), and scheduling of a procedure (e.g. Endoscopy, Colonoscopy) if required.  Please allow enough time on your schedule to allow for any/all of these possibilities.    If you cannot keep your appointment, please call [phone] to cancel or reschedule prior to your appointment date.  This allows [place] the opportunity to schedule an appointment for another patient in need of care.  If you do not cancel or reschedule by 5 p.m. the business day prior to your appointment date, you will be charged a $50.00 late cancellation/no-show fee.     Thank you for choosing [facility] for your medical needs.  We appreciate the opportunity to care for you.  Please visit [place] at our website  to learn more about our practice.                     Sincerely,                                                             The Gastroenterology Division

## 2010-03-29 ENCOUNTER — Other Ambulatory Visit: Payer: Self-pay | Admitting: Gastroenterology

## 2010-03-29 ENCOUNTER — Encounter (INDEPENDENT_AMBULATORY_CARE_PROVIDER_SITE_OTHER): Payer: Self-pay | Admitting: *Deleted

## 2010-03-29 ENCOUNTER — Other Ambulatory Visit: Payer: BC Managed Care – PPO

## 2010-03-29 ENCOUNTER — Encounter: Payer: Self-pay | Admitting: Gastroenterology

## 2010-03-29 DIAGNOSIS — R740 Nonspecific elevation of levels of transaminase and lactic acid dehydrogenase [LDH]: Secondary | ICD-10-CM

## 2010-03-29 DIAGNOSIS — R7401 Elevation of levels of liver transaminase levels: Secondary | ICD-10-CM

## 2010-03-29 LAB — PROTIME-INR
INR: 1 ratio (ref 0.8–1.0)
Prothrombin Time: 11.6 s (ref 10.2–12.4)

## 2010-04-01 ENCOUNTER — Other Ambulatory Visit: Payer: Self-pay | Admitting: Gastroenterology

## 2010-04-01 ENCOUNTER — Encounter (AMBULATORY_SURGERY_CENTER): Payer: BC Managed Care – PPO | Admitting: Gastroenterology

## 2010-04-01 ENCOUNTER — Encounter: Payer: Self-pay | Admitting: Gastroenterology

## 2010-04-01 DIAGNOSIS — R109 Unspecified abdominal pain: Secondary | ICD-10-CM

## 2010-04-01 DIAGNOSIS — R7401 Elevation of levels of liver transaminase levels: Secondary | ICD-10-CM

## 2010-04-01 DIAGNOSIS — K449 Diaphragmatic hernia without obstruction or gangrene: Secondary | ICD-10-CM

## 2010-04-01 DIAGNOSIS — R7402 Elevation of levels of lactic acid dehydrogenase (LDH): Secondary | ICD-10-CM

## 2010-04-01 DIAGNOSIS — K219 Gastro-esophageal reflux disease without esophagitis: Secondary | ICD-10-CM

## 2010-04-01 DIAGNOSIS — D1330 Benign neoplasm of unspecified part of small intestine: Secondary | ICD-10-CM

## 2010-04-01 LAB — HM COLONOSCOPY

## 2010-04-03 NOTE — Letter (Signed)
Summary: Carroll Sage MD  Carroll Sage MD   Imported By: Sherian Rein 03/26/2010 09:19:17  _____________________________________________________________________  External Attachment:    Type:   Image     Comment:   External Document

## 2010-04-03 NOTE — Assessment & Plan Note (Addendum)
Summary: LOWER ABD PAIN, ? DIVERTICULITIES SCHW  MARY BCBS-INS COPAY A...   History of Present Illness Visit Type: Initial Consult Primary GI MD: Sheryn Bison MD FACP FAGA Primary Provider: Bayard Males Requesting Provider: Bayard Males Chief Complaint: Abdominal pain , Pt thinks he may have diverticulitis but has never been dx'd with it. 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 Oliver Barre for evaluation of recurrent lower abdominal discomfort of a constant nature radiating into his low back associated possible sacroiliitis. Since July 4 the patient has had recurrent episodes of left lower quadrant discomfort partially responsive to Cipro and metronidazole. He has been seen at [facility] urology had a negative CT scan of his abdomen and pelvis. Patient does have soft frequent stools but denies true diarrhea, rectal bleeding, nausea vomiting, fever or chills.  He does have chronic GERD and is on omeprazole 20 mg a day. Apparently he is on allopurinol for history of gouty arthritis and also takes Ecotrin, lovastatin, and ibuprofen. Review of his labs shows abnormal liver function test with transaminases approximately twice normal one year ago.He relates he was diagnosed with" fatty liver", but I cannot find an ultrasound report. He denies any hepatobiliary complaints or history of known gallbladder or liver disease. Family history is entirely unremarkable. He follows a regular diet and denies food intolerances.  There is no history of dysphagia,anorexia or weight loss. His has several urologic workup which apparently have been negative. He did have repair of an inguinal hernia by Dr. Bertram Savin and continues to have some pressure in his left groin area. Apparently he did have mesh implantation.   GI Review of Systems    Reports abdominal pain and  belching.     Location of  Abdominal pain: lower  abdomen.    Denies acid reflux, bloating, chest pain, dysphagia with liquids, dysphagia with solids, heartburn, loss of appetite, nausea, vomiting, vomiting blood, weight loss, and  weight gain.        Denies anal fissure, black tarry stools, change in bowel habit, constipation, diarrhea, diverticulosis, fecal incontinence, heme positive stool, hemorrhoids, irritable bowel syndrome, jaundice, light color stool, liver problems, rectal bleeding, and  rectal pain. Preventive Screening-Counseling & Management      Drug Use:  no.      Current Medications (verified): 1)  Ibuprofen 800 Mg Tabs (Ibuprofen) .Marland Kitchen.. 1 By Mouth Two Times A Day As Needed Pain 2)  Lovastatin 40 Mg  Tabs (Lovastatin) .Marland Kitchen.. 1 By Mouth Once Daily 3)  Ecotrin Low Strength 81 Mg  Tbec (Aspirin) .Marland Kitchen.. 1 By Mouth Qd 4)  Allopurinol 300 Mg  Tabs (Allopurinol) .Marland Kitchen.. 1 By Mouth Every Other Day 5)  Zolpidem Tartrate 10 Mg  Tabs (Zolpidem Tartrate) .Marland Kitchen.. 1 By Mouth At Bedtime As Needed 6)  Omeprazole 20 Mg Cpdr (Omeprazole) .Marland Kitchen.. 1 By Mouth Two Times A Day 7)  Ciprofloxacin Hcl 500 Mg Tabs (Ciprofloxacin Hcl) .Marland Kitchen.. 1 By Mouth Two Times A Day 8)  Metronidazole 250 Mg Tabs (Metronidazole) .Marland Kitchen.. 1 By Mouth Qid For 10 Days  Allergies (verified): No Known Drug Allergies  Past History:  Past medical, surgical, family and social histories (including risk factors) reviewed for relevance to current acute and chronic problems.  Past Medical History: Reviewed history from 10/22/2009 and no changes required. Depression Gout Hyperlipidemia Allergic rhinitis Diabetes mellitus, type II GERD  Past Surgical History: Reviewed history from 12/29/2008 and no changes required.  Appendectomy s/p deviated nasal septum 2010 s/p unbilical hernia repair 2009  Family History: Reviewed history from 11/27/2006 and no changes required. Family History of CAD Male 1st degree relative <50 Family History Diabetes 1st degree relative Family History High  cholesterol Family History Hypertension Family History of Stroke F 1st degree relative <60 Family History Thyroid disease No FH of Colon Cancer:  Social History: Reviewed history from 03/18/2010 and no changes required. 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  The patient denies allergy/sinus, anemia, anxiety-new, blood in urine, breast changes/lumps, change in vision, confusion, cough, coughing up blood, depression-new, fainting, fatigue, fever, headaches-new, hearing problems, heart murmur, heart rhythm changes, itching, muscle pains/cramps, night sweats, nosebleeds, shortness of breath, skin rash, sleeping problems, sore throat, swelling of feet/legs, swollen lymph glands, thirst - excessive, urination - excessive, urination changes/pain, urine leakage, vision changes, and voice change.         HIP PAIN  Vital Signs:  Patient profile:   50 year old male Height:      69 inches Weight:      228 pounds BMI:     33.79 BSA:     2.19 Pulse rate:   80 / minute Pulse rhythm:   regular BP sitting:   126 / 84  (left arm)  Vitals Entered By: Merri Ray CMA Duncan Dull) (March 26, 2010 9:56 AM)  Physical Exam  General:  Well developed, well nourished, no acute distress.healthy appearing.   Head:  Normocephalic and atraumatic. Eyes:  PERRLA, no icterus.exam deferred to patient's ophthalmologist.   Neck:  Supple; no masses or thyromegaly. Lungs:  Clear throughout to auscultation. Heart:  Regular rate and rhythm; no murmurs, rubs,  or bruits. Abdomen:  Soft, nontender and nondistended. No masses, hepatosplenomegaly or hernias noted. Normal bowel sounds. Rectal:  Normal exam.hemocult negative.   Prostate:  .normal size prostate.   Msk:  Symmetrical with no gross deformities. Normal posture. Pulses:   Normal pulses noted. Extremities:  No clubbing, cyanosis, edema or deformities noted. Neurologic:  Alert and  oriented x4;  grossly normal neurologically. Skin:  Intact without significant lesions or rashes. Cervical Nodes:  No significant cervical adenopathy. Psych:  Alert and cooperative. Normal mood and affect.   Impression & Recommendations:  Problem # 1:  ABDOMINAL PAIN, LEFT LOWER QUADRANT (ICD-789.04) Assessment Unchanged Rule Out inflammatory bowel disease versus IBS versus segmental colitis associated with chronic diverticulosis. Labs have been ordered and we will schedule colonoscopy exam. I placed him on Bentyl 10 mg t.i.d. pending further workup. CT Scan has been requested for review. Orders: TLB-CBC Platelet - w/Differential (85025-CBCD) TLB-BMP (Basic Metabolic Panel-BMET) (80048-METABOL) TLB-Hepatic/Liver Function Pnl (80076-HEPATIC) TLB-TSH (Thyroid Stimulating Hormone) (84443-TSH) TLB-B12, Serum-Total ONLY (78295-A21) TLB-Ferritin (82728-FER) TLB-Folic Acid (Folate) (82746-FOL) TLB-IBC Pnl (Iron/FE;Transferrin) (83550-IBC) TLB-Amylase (82150-AMYL) TLB-Lipase (83690-LIPASE) TLB-CRP-High Sensitivity (C-Reactive Protein) (86140-FCRP) TLB-Sedimentation Rate (ESR) (85652-ESR) Colon/Endo (Colon/Endo)  Problem # 2:  GERD (ICD-530.81) Assessment: Improved Continue omeprazole 20 mg a day with outpatient endoscopy, small bowel biopsy, examination for H. pylori ordered. Orders: TLB-CBC Platelet - w/Differential (85025-CBCD) TLB-BMP (Basic Metabolic Panel-BMET) (80048-METABOL) TLB-Hepatic/Liver Function Pnl (80076-HEPATIC) TLB-TSH (Thyroid Stimulating Hormone) (84443-TSH) TLB-B12, Serum-Total ONLY (30865-H84) TLB-Ferritin (82728-FER) TLB-Folic Acid (Folate) (82746-FOL) TLB-IBC Pnl (Iron/FE;Transferrin) (83550-IBC) TLB-Amylase (82150-AMYL) TLB-Lipase (83690-LIPASE) TLB-CRP-High Sensitivity (C-Reactive Protein) (86140-FCRP) TLB-Sedimentation Rate (ESR)  (85652-ESR) Colon/Endo (Colon/Endo)  Problem #  3:  NONSPEC ELEVATION OF LEVELS OF TRANSAMINASE/LDH (ICD-790.4) Assessment: Unchanged CT scan in November did show hepatic steatosis. We will repeat his liver enzymes and decide if further liver workup as indicated. Acute hepatitis serologies were negative.  Problem # 4:  INGUINAL HERNIA, LEFT (ICD-550.90) Assessment: Improved  Patient Instructions: 1)  Copy sent to : Bayard Males 2)  Your prescription(s) have been sent to you pharmacy.  3)  Please go to the basement today for your labs.  4)  Your procedure has been scheduled for 04/01/2010, please follow the seperate instructions.  5)  [facility] Patient Information Guide given to patient.  6)  Colonoscopy and Flexible Sigmoidoscopy brochure given.  7)  Upper Endoscopy brochure given.  8)  The medication list was reviewed and reconciled.  All changed / newly prescribed medications were explained.  A complete medication list was provided to the patient / caregiver. Prescriptions: MOVIPREP 100 GM  SOLR (PEG-KCL-NACL-NASULF-NA ASC-C) As per prep instructions.  #1 x 0   Entered by:   Harlow Mares CMA (AAMA)   Authorized by:   Mardella Layman MD [facility]   Signed by:   Mardella Layman MD [facility] on 03/26/2010   Method used:   Electronically to        [facility]* (retail)       [address]       [place], [place]  [address]       Ph: [phone]       Fax: [phone]   RxID:   [id number] BENTYL 10 MG CAPS (DICYCLOMINE HCL) take one by mouth three times a day before meals.  #90 x 3   Entered by:   Harlow Mares CMA (AAMA)   Authorized by:   Mardella Layman MD [facility]   Signed by:   Mardella Layman MD [facility] on 03/26/2010   Method used:   Electronically to        [facility]* (retail)       [address]       [place], [place]  [address]       Ph: [phone]       Fax: [phone]   RxID:   [id number]

## 2010-04-03 NOTE — Letter (Signed)
Summary: [facility] Instructions  [facility]  [address] [place], [place] [address]   Phone: [phone]  Fax: [phone]       Troy Parsons    [birth year]    MRN: [id number]        Procedure Day /Date: Monday 04/01/2010     Arrival Time: 1:00pm     Procedure Time: 2:00pm     Location of Procedure:                    X  [facility] (4th Floor)   PREPARATION FOR COLONOSCOPY WITH MOVIPREP   Starting 5 days prior to your procedure 03/27/2010 do not eat nuts, seeds, popcorn, corn, beans, peas,  salads, or any raw vegetables.  Do not take any fiber supplements (e.g. Metamucil, Citrucel, and Benefiber).  THE DAY BEFORE YOUR PROCEDURE         Sunday 03/31/2010  1.  Drink clear liquids the entire day-NO SOLID FOOD  2.  Do not drink anything colored red or purple.  Avoid juices with pulp.  No orange juice.  3.  Drink at least 64 oz. (8 glasses) of fluid/clear liquids during the day to prevent dehydration and help the prep work efficiently.  CLEAR LIQUIDS INCLUDE: Water Jello Ice Popsicles Tea (sugar ok, no milk/cream) Powdered fruit flavored drinks Coffee (sugar ok, no milk/cream) Gatorade Juice: apple, white grape, white cranberry  Lemonade Clear bullion, consomm, broth Carbonated beverages (any kind) Strained chicken noodle soup Hard Candy                             4.  In the morning, mix first dose of MoviPrep solution:    Empty 1 Pouch A and 1 Pouch B into the disposable container    Add lukewarm drinking water to the top line of the container. Mix to dissolve    Refrigerate (mixed solution should be used within 24 hrs)  5.  Begin drinking the prep at 5:00 p.m. The MoviPrep container is divided by 4 marks.   Every 15 minutes drink the solution down to the next mark (approximately 8 oz) until the full liter is complete.   6.  Follow completed prep with 16 oz of clear liquid of your choice (Nothing red or purple).  Continue to drink  clear liquids until bedtime.  7.  Before going to bed, mix second dose of MoviPrep solution:    Empty 1 Pouch A and 1 Pouch B into the disposable container    Add lukewarm drinking water to the top line of the container. Mix to dissolve    Refrigerate  THE DAY OF YOUR PROCEDURE      Monday 04/01/2010  Beginning at 9:00am (5 hours before procedure):         1. Every 15 minutes, drink the solution down to the next mark (approx 8 oz) until the full liter is complete.  2. Follow completed prep with 16 oz. of clear liquid of your choice.    3. You may drink clear liquids until 12:00pm (2 HOURS BEFORE PROCEDURE).   MEDICATION INSTRUCTIONS  Unless otherwise instructed, you should take regular prescription medications with a small sip of water   as early as possible the morning of your procedure.         OTHER INSTRUCTIONS  You will need a responsible adult at least 48 years of age to accompany you and drive you  home.   This person must remain in the waiting room during your procedure.  Wear loose fitting clothing that is easily removed.  Leave jewelry and other valuables at home.  However, you may wish to bring a book to read or  an iPod/MP3 player to listen to music as you wait for your procedure to start.  Remove all body piercing jewelry and leave at home.  Total time from sign-in until discharge is approximately 2-3 hours.  You should go home directly after your procedure and rest.  You can resume normal activities the  day after your procedure.  The day of your procedure you should not:   Drive   Make legal decisions   Operate machinery   Drink alcohol   Return to work  You will receive specific instructions about eating, activities and medications before you leave.    The above instructions have been reviewed and explained to me by   _______________________    I fully understand and can verbalize these instructions _____________________________ Date  _________

## 2010-04-05 ENCOUNTER — Encounter: Payer: Self-pay | Admitting: Gastroenterology

## 2010-04-09 ENCOUNTER — Other Ambulatory Visit (HOSPITAL_COMMUNITY): Payer: BC Managed Care – PPO

## 2010-04-09 NOTE — Procedures (Addendum)
Summary: Colonoscopy  Patient: [name] Note: All result statuses are Final unless otherwise noted.  Tests: (1) Colonoscopy (COL)   COL Colonoscopy           DONE     [facility]     [address]     [place], [place]  [address]           COLONOSCOPY PROCEDURE REPORT           PATIENT:  Troy Parsons, Troy Parsons  MR#:  [id number]     BIRTHDATE:  [birth year], 49 yrs. old  GENDER:  male     ENDOSCOPIST:  Vania Rea. Jarold Motto, MD, [facility]     REF. BY:     PROCEDURE DATE:  04/01/2010     PROCEDURE:  Average-risk screening colonoscopy     G0121     ASA CLASS:  Class II     INDICATIONS:  Abdominal pain, Routine Risk Screening     MEDICATIONS:   Fentanyl 100 mcg IV, Versed 10 mg IV           DESCRIPTION OF PROCEDURE:   After the risks benefits and     alternatives of the procedure were thoroughly explained, informed     consent was obtained.  Digital rectal exam was performed and     revealed no abnormalities.   The LB CF-H180AL [id number] endoscope     was introduced through the anus and advanced to the cecum, which     was identified by both the appendix and ileocecal valve, without     limitations.  The quality of the prep was excellent, using     MoviPrep.  The instrument was then slowly withdrawn as the colon     was fully examined.     <<PROCEDUREIMAGES>>           FINDINGS:  No polyps or cancers were seen.  no active bleeding or     blood in c.  This was otherwise a normal examination of the colon.     Retroflexed views in the rectum revealed no abnormalities.    The     scope was then withdrawn from the patient and the procedure     completed.           COMPLICATIONS:  None     ENDOSCOPIC IMPRESSION:     1) No polyps or cancers     2) No active bleeding or blood in c     3) Otherwise normal examination     RECOMMENDATIONS:     1) Continue current medications     2) Upper endoscopy will be scheduled     REPEAT EXAM:  No           ______________________________     Vania Rea. Jarold Motto, MD, [facility]           CC:  Corwin Levins, MD           n.     Rosalie DoctorMarland Kitchen   Vania Rea. Patterson at 04/01/2010 02:41 PM           Leonia Corona, [id number]  Note: An exclamation mark (!) indicates a result that was not dispersed into the flowsheet. Document Creation Date: 04/01/2010 2:41 PM _______________________________________________________________________  (1) Order result status: Final Collection or observation date-time: 04/01/2010 14:29 Requested date-time:  Receipt date-time:  Reported date-time:  Referring Physician:   Ordering Physician: Sheryn Bison [phone]) Specimen Source:  Source: Launa Grill Order Number: [id number] Lab site:

## 2010-04-09 NOTE — Procedures (Addendum)
Summary: Upper Endoscopy  Patient: Troy Parsons Note: All result statuses are Final unless otherwise noted.  Tests: (1) Upper Endoscopy (EGD)   EGD Upper Endoscopy       DONE     [facility]     [address]     [place], [place]  [address]           ENDOSCOPY PROCEDURE REPORT           PATIENT:  Troy, Parsons  MR#:  [id number]     BIRTHDATE:  1960-05-20, 49 yrs. old  GENDER:  male           ENDOSCOPIST:  Vania Rea. Jarold Motto, MD, [facility]     Referred by:           PROCEDURE DATE:  04/01/2010     PROCEDURE:  EGD with biopsy, 19147     ASA CLASS:  Class II     INDICATIONS:  GERD ABNORMAL LIVER ENZYMES.           MEDICATIONS:   There was residual sedation effect present from     prior procedure., Fentanyl 25 mcg IV, Versed 3 mg IV     TOPICAL ANESTHETIC:  Exactacain Spray           DESCRIPTION OF PROCEDURE:   After the risks benefits and     alternatives of the procedure were thoroughly explained, informed     consent was obtained.  The LB GIF-H180 [id number] endoscope was     introduced through the mouth and advanced to the second portion of     the duodenum, without limitations.  The instrument was slowly     withdrawn as the mucosa was fully examined.     <<PROCEDUREIMAGES>>           The esophagus and gastroesophageal junction were completely normal     in appearance.  A hiatal hernia was found. 4-5 CM HH NOTED.NO     STRICTURE OR BARRETT'S MUCOSA NOTED  Normal duodenal folds were     noted. SI BX. DONE.  The stomach was entered and closely examined.     The antrum, angularis, and lesser curvature were well visualized,     including a retroflexed view of the cardia and fundus. The stomach     wall was normally distensable. The scope passed easily through the     pylorus into the duodenum. CLO BX. DONE.    Retroflexed views     revealed a hiatal hernia.    The scope was then withdrawn from the     patient and the procedure completed.           COMPLICATIONS:  None           ENDOSCOPIC IMPRESSION:     1) Normal esophagus     2) Hiatal hernia     3) Normal duodenal folds     4) Normal stomach     1.HIATIAL HERNIA AND CHRONIC GERD.NO BARRETT'S MUCOSA     2.R/O CELIAC DISEASE     3.R/O H.PYLORI.     RECOMMENDATIONS:     1) Await pathology results     2) Rx CLO if positive     3) continue PPI     STOP STATIN RX AND REPEAT LIVER ENZYMES IN 1 MONTH.           REPEAT EXAM:  No           ______________________________  Vania Rea. Jarold Motto, MD, [facility]           CC:  Corwin Levins, MD           n.     Rosalie DoctorMarland Kitchen   Vania Rea. Patterson at 04/01/2010 02:49 PM           Leonia Corona, [id number]  Note: An exclamation mark (!) indicates a result that was not dispersed into the flowsheet. Document Creation Date: 04/01/2010 2:49 PM _______________________________________________________________________  (1) Order result status: Final Collection or observation date-time: 04/01/2010 14:38 Requested date-time:  Receipt date-time:  Reported date-time:  Referring Physician:   Ordering Physician: Sheryn Bison [phone]) Specimen Source:  Source: Launa Grill Order Number: [id number] Lab site:   Appended Document: Upper Endoscopy Per Dr Jarold Motto, discontinued Lovastatin in order to check Liver Emzymes in 1 month.  Appended Document: remove Lovastatin    Clinical Lists Changes  Medications: Removed medication of LOVASTATIN 40 MG  TABS (LOVASTATIN) 1 by mouth once daily      Appended Document: Upper Endoscopy Lmom for patient to return our call.  Appended Document: Upper Endoscopy Dr Jarold Motto, patient informed to stop the Lovastation and repeat labs in 1 month; he wants to know if he should proceed with the Abdominal U/S? Thanks.  Appended Document: Upper Endoscopy pt aware to have abdominal ultrasound

## 2010-04-09 NOTE — Letter (Addendum)
Summary: Patient [facility] Biopsy Results  [facility]  [address] [place], [place] [address]   Phone: [phone]  Fax: [phone]        April 05, 2010 MRN: [id number]    Troy Parsons [address] [place], [place]  [address]    Dear [name],  I am pleased to inform you that the biopsies taken during your recent endoscopic examination did not show any evidence of cancer upon pathologic examination.  Additional information/recommendations:  __No further action is needed at this time.  Please follow-up with      your primary care physician for your other healthcare needs.  __ Please call [phone] to schedule a return visit to review      your condition.  _x_ Continue with the treatment plan as outlined on the day of your      exam.  __ You should have a repeat endoscopic examination for this problem              in _ months/years.   Please call us if you are having persistent problems or have questions about your condition that have not been fully answered at this time.  Sincerely,  Mardella Layman MD [facility]  This letter has been electronically signed by your physician.  Appended Document: Patient Notice-Endo Biopsy Results letter mailed  Appended Document: Patient Notice-Endo Biopsy Results letter mailed

## 2010-04-09 NOTE — Miscellaneous (Signed)
Summary: Clotest  Clinical Lists Changes  Orders: Added new Test order of TLB-H Pylori Screen Gastric Biopsy (83013-CLOTEST) - Signed 

## 2010-04-22 ENCOUNTER — Encounter: Payer: Self-pay | Admitting: Internal Medicine

## 2010-04-22 ENCOUNTER — Ambulatory Visit (HOSPITAL_COMMUNITY)
Admission: RE | Admit: 2010-04-22 | Discharge: 2010-04-22 | Disposition: A | Discharge status: Home / Self Care | Payer: BC Managed Care – PPO | Source: Ambulatory Visit | Attending: Gastroenterology | Admitting: Gastroenterology

## 2010-04-22 DIAGNOSIS — R945 Abnormal results of liver function studies: Secondary | ICD-10-CM

## 2010-04-22 DIAGNOSIS — R7989 Other specified abnormal findings of blood chemistry: Secondary | ICD-10-CM | POA: Insufficient documentation

## 2010-04-22 DIAGNOSIS — K7689 Other specified diseases of liver: Secondary | ICD-10-CM | POA: Insufficient documentation

## 2010-04-29 ENCOUNTER — Other Ambulatory Visit: Payer: BC Managed Care – PPO

## 2010-04-30 ENCOUNTER — Ambulatory Visit: Payer: BC Managed Care – PPO

## 2010-04-30 DIAGNOSIS — R7401 Elevation of levels of liver transaminase levels: Secondary | ICD-10-CM

## 2010-04-30 LAB — HEPATIC FUNCTION PANEL
ALT: 66 U/L — ABNORMAL HIGH (ref 0–53)
AST: 47 U/L — ABNORMAL HIGH (ref 0–37)
Albumin: 4.2 g/dL (ref 3.5–5.2)
Alkaline Phosphatase: 61 U/L (ref 39–117)

## 2010-05-03 ENCOUNTER — Ambulatory Visit (INDEPENDENT_AMBULATORY_CARE_PROVIDER_SITE_OTHER): Payer: BC Managed Care – PPO | Admitting: Gastroenterology

## 2010-05-03 ENCOUNTER — Encounter: Payer: Self-pay | Admitting: Internal Medicine

## 2010-05-03 ENCOUNTER — Encounter: Payer: Self-pay | Admitting: Gastroenterology

## 2010-05-03 DIAGNOSIS — K219 Gastro-esophageal reflux disease without esophagitis: Secondary | ICD-10-CM

## 2010-05-03 DIAGNOSIS — K7689 Other specified diseases of liver: Secondary | ICD-10-CM

## 2010-05-03 DIAGNOSIS — K589 Irritable bowel syndrome without diarrhea: Secondary | ICD-10-CM

## 2010-05-03 DIAGNOSIS — K760 Fatty (change of) liver, not elsewhere classified: Secondary | ICD-10-CM

## 2010-05-03 NOTE — Patient Instructions (Signed)
Return in 2 months for follow up labs in our basement. We provided you a copy of your abdominal ultrasound today.

## 2010-05-03 NOTE — Progress Notes (Signed)
History of Present Illness:  This is a  50-year-old Caucasian male   Who has IBS and  Acid reflux disease controlled with Prilosec 40 mg a day. Recent ultrasound abdomen has shown fatty infiltration the liver , and he has mildly elevated liver enzymes area he has discontinued his statin medication is currently asymptomatic. We have prescribed  Bentyl  10 mg for when necessary use which he has not used. Currently he is asymptomatic in terms of GI or general medical problems. l ROS: The remainder of the 8 point ROS is negative     Physical Exam: General well developed well nourished patient in no acute distress, appearing their stated age MS clear  Impression and plan: he is to continue his current medications. We will repeat his liver enzymes and lipid profile in 2 months. Depending on the results of this test he may need further hepatic workup and perhaps liver biopsy. 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

## 2010-06-28 NOTE — Op Note (Signed)
NAME:  Troy Parsons NO.:  [id number]   MEDICAL RECORD NO.:  [id number]                   PATIENT TYPE:  INP   LOCATION:  0106                                 FACILITY:  [facility]   PHYSICIAN:  Anselm Pancoast. Zachery Dakins, M.D.          DATE OF BIRTH:  1960/06/26   DATE OF PROCEDURE:  11/15/2002  DATE OF DISCHARGE:                                 OPERATIVE REPORT   PREOPERATIVE DIAGNOSIS:  Acute appendicitis.   POSTOPERATIVE DIAGNOSIS:  Acute appendicitis. Await final path.   OPERATION:  Laparoscopic appendectomy.   ANESTHESIA:  General.   SURGEON:  Anselm Pancoast. Zachery Dakins, M.D.   HISTORY:  Troy Parsons is a 50 year old male who presented to the emergency  room with about a 3 hour history of increase in right lower quadrant  abdominal pain. The patient did not give a history of prodromal nausea and  vomiting yesterday but awoke about 2 a.m. with a pain to the right side  lateral to the umbilicus. He presented to the emergency room about two hours  later and was seen by Dr. Adriana Simas. On examination, he found that he was  definitely locally tender with muscle guarding and white count was 13,000  and his urinalysis was unremarkable. He called me thinking this was acute  appendicitis and I came in and examined the patient and was concerned that  his pain possibly was a little bit higher than I thought where the appendix  would be, he is about 200 pounds and his liver tests are just slightly  abnormal. We took him over to ultrasound and did an examination of the right  side. We could see his gallbladder that was not dilated and the pain was not  around where his gallbladder was. We could see the cecum and the appendix  area, could not actually visualize the appendix but he appeared to be most  tender right in that location. The right kidney was visualized and there was  no evidence of any hydro and of course there had been no blood in his urine.  He does have a  history of gout and I recommended that with this finding I  would agree that it was mostly likely appendicitis. The patient was given 3  g of Unasyn and permission obtained for a laparoscopic appendectomy.   DESCRIPTION OF PROCEDURE:  The patient was taken to the operative suite,  induction of general anesthesia. He had received his 3 g of Unasyn, a Foley  catheter was inserted up to the duct and general anesthesia. The abdomen was  prepped with Betadine surgical scrub and solution and draped in a sterile  manner. I made a small incision right below the umbilicus down through the  adipose tissue grasping the fascia. This was picked up between two Kocher's  after a small opening and then kind of carefully entered into the peritoneal  cavity. A pursestring suture was placed, the  Hasson cannula introduced.  There was no evidence of any peritonitis or acute inflammation in the right  lower quadrant. The upper 5 mm trocar was placed lateral with the subcostal  area. A Kingsley Spittle was placed and then in the right lower quadrant the  appendix was where he had localized the pain and the appendix is stiff, not  that of an obvious acutely inflamed appendicitis but it is slightly stiff.  The left lower quadrant 10/11 trocar was placed under direct vision and then  I basically first ran the distal small bowel for probably about 4 feet and  could not see any evidence of any Meckel's diverticulitis or inflammation  and the sigmoid colon and colon I could visualize, looked unremarkable. I  then went ahead and grasped the appendix with million dollar area and the  base of the appendix was actually easily approached with a right angle and I  first divided the appendix at its junction with the cecum with a GIA linear  stapler laparoscopic with the fine staples first. Next, a second fire was  placed along the base of the mesentery and this was then fired and there was  good hemostasis. I placed the appendix in  an EndoCatch bag, brought it out  and on the back table later felt the appendix and it was thickened. I waited  for the pathologist to tell us whether this was obviously an acute  appendicitis or not. The reinspection mesentery where the linear stapler had  fired on both the cecum and the mesentery was good hemostasis. The  irrigation fluid was aspirated and I then kind of looked in the left lower  quadrant and could see no evidence of any other acute inflammation. The  appendix having been removed, I then removed the 10/11 trocar in the left  lower quadrant, no bleeding. Then the irrigating fluid had definitely been  all aspirated. I switched him left to right to make sure this was no fluid  that needed to be aspirated further and then the upper 5 mm port withdrawn.  Released the carbon dioxide, removed the Hasson cannula. Did place a second  figure-of-eight in the fascia at the umbilicus and both sutures were tied  under direct vision. I then placed some Marcaine in the fascia at the  umbilicus. The subcutaneous wounds were closed with 4-0 Vicryl and Benzoin  and Steri-Strips on the skin. Hopefully the patient's pain will be gone and  will keep him on about 2 doses of antibiotics and he will probably be  discharged in the a.m.  Wait for the final path. I think this is an early  acute appendicitis.                                               Anselm Pancoast. Zachery Dakins, M.D.    WJW/MEDQ  D:  11/15/2002  T:  11/15/2002  Job:  [id number]

## 2010-06-28 NOTE — H&P (Signed)
NAME:  Troy Parsons NO.:  [id number]   MEDICAL RECORD NO.:  [id number]                   PATIENT TYPE:  INP   LOCATION:  0106                                 FACILITY:  [facility]   PHYSICIAN:  Anselm Pancoast. Zachery Dakins, M.D.          DATE OF BIRTH:  [birth year]   DATE OF ADMISSION:  11/15/2002  DATE OF DISCHARGE:                                HISTORY & PHYSICAL   CHIEF COMPLAINT:  Right lower quadrant abdominal pain.   HISTORY:  Troy Parsons is a 50 year old male who presented to the emergency  room with his partner about 3:30 a.m. with a few-hour history of increasing  right lower quadrant abdominal pain.  The patient states that he had felt  well yesterday, works, he is a Chief of Staff and said he had a bowel movement  yesterday, no previous problems with diverticulitis or other problems and  then he awoke this morning about 2 a.m. with increasing right lower quadrant  abdominal pain.  He denies a history of nausea, vomiting, or sour stomach  type feeling yesterday and in the emergency room was examined by Dr. Adriana Simas,  the ER physician, who found there was definitely positive guarding in the  right lower quadrant, afebrile with a white count of about 13,500 and a  urinalysis that was normal.  His CMET showed slightly elevated liver  function studies.  The patient states that he had a previous ultrasound  because of his mildly abnormal liver function studies.  I was asked to see  him and arrived about 6:15 and on examination I was impressed that his pain  was a little higher than I thought possibly the appendix would be and that  it was kind of lateral to the umbilicus and took him on over to ultrasound  and we did an ultrasound of the right side.  We could visualize a  gallbladder without stones and his pain was not up around the gallbladder  which is fairly high, you could see the cecum and terminal ileal area and he  did localize the pain there and there  was no evidence of any hydronephrosis  or abnormality noted on his kidney with the ultrasound.  The remainder of  the abdominal examination by ultrasound was unremarkable.  With the pain  sort of being confirmed that it was in the area of the appendix and no other  abnormalities noted we sort of accepted that this is probably appendicitis  even though he did not have the definite prodromal symptoms.   PAST MEDICAL HISTORY:  I do not think he has had any previous surgeries.  He  denies allergies.  He is on colchicine for prevention of gout.  He takes  vitamins and denies urinary type symptoms.   PHYSICAL EXAMINATION:  VITAL SIGNS:  Temperature was 99.5, blood pressure  was 150/95, pulse 70, respirations 18.  EYES/EARS/NOSE AND THROAT:  Well hydrated; no problems.  LUNGS:  Clear.  CARDIAC:  Normal sinus rhythm.  ABDOMEN:  He is definitely tender to the right side of the abdomen kind of  lateral to the umbilicus but on ultrasound exam we convinced ourselves that  it is definitely a little lower than the gallbladder area.  He is not tender  on the left side of his abdomen.  He has not had diarrhea.  RECTAL:  Unremarkable.  EXTREMITIES:  No skin lesions and no evidence of any pedal edema or vascular  abnormalities.  CNS:  Physiologic.   ADMISSION IMPRESSION:  Early acute appendicitis.    PLAN:  The patient will undergo a laparoscopic appendectomy.  We discussed  about the possibility of maybe doing a CT but his pain is pretty well  localized and both the ER physician and I were impressed as the patient  stated the pain is getting worse.  Three grams of Unasyn ordered.  We will  plan the procedure at the OR soon.                                               Anselm Pancoast. Zachery Dakins, M.D.    WJW/MEDQ  D:  11/15/2002  T:  11/15/2002  Job:  [id number]

## 2010-08-06 ENCOUNTER — Telehealth: Payer: Self-pay | Admitting: *Deleted

## 2010-08-06 NOTE — Telephone Encounter (Signed)
Message copied by Leonette Monarch on Tue Aug 06, 2010  4:57 PM ------      Message from: Harlow Mares D      Created: Wed Jul 31, 2010 10:02 AM       lmom       ----- Message -----         From: Harlow Mares, CMA         Sent: 07/30/2010           To: Harlow Mares            lfts

## 2010-08-06 NOTE — Telephone Encounter (Signed)
lmom patient is due for labs I will attempted to comtact pt twice I will call him once more and then mail letter.

## 2010-08-08 ENCOUNTER — Other Ambulatory Visit (INDEPENDENT_AMBULATORY_CARE_PROVIDER_SITE_OTHER): Payer: BC Managed Care – PPO

## 2010-08-08 DIAGNOSIS — K7689 Other specified diseases of liver: Secondary | ICD-10-CM

## 2010-08-08 DIAGNOSIS — K219 Gastro-esophageal reflux disease without esophagitis: Secondary | ICD-10-CM

## 2010-08-08 DIAGNOSIS — K589 Irritable bowel syndrome without diarrhea: Secondary | ICD-10-CM

## 2010-08-08 LAB — LIPID PANEL
Cholesterol: 197 mg/dL (ref 0–200)
VLDL: 33.2 mg/dL (ref 0.0–40.0)

## 2010-08-08 LAB — HEPATIC FUNCTION PANEL
ALT: 39 U/L (ref 0–53)
AST: 29 U/L (ref 0–37)
Albumin: 4.6 g/dL (ref 3.5–5.2)

## 2010-08-08 NOTE — Telephone Encounter (Signed)
Pt aware he will come Monday.

## 2010-09-02 ENCOUNTER — Telehealth: Payer: Self-pay | Admitting: Gastroenterology

## 2010-09-02 NOTE — Telephone Encounter (Signed)
Advised pt that his lfts are normal and I have routed his lipid panel to Dr Jonny Ruiz per his request.

## 2010-11-08 ENCOUNTER — Other Ambulatory Visit: Payer: Self-pay | Admitting: Internal Medicine

## 2010-12-02 ENCOUNTER — Other Ambulatory Visit: Payer: Self-pay | Admitting: Internal Medicine

## 2011-01-07 ENCOUNTER — Encounter: Payer: Self-pay | Admitting: Internal Medicine

## 2011-01-07 DIAGNOSIS — Z0001 Encounter for general adult medical examination with abnormal findings: Secondary | ICD-10-CM | POA: Insufficient documentation

## 2011-01-07 DIAGNOSIS — Z0000 Encounter for general adult medical examination without abnormal findings: Secondary | ICD-10-CM | POA: Insufficient documentation

## 2011-01-13 ENCOUNTER — Encounter: Payer: Self-pay | Admitting: Internal Medicine

## 2011-01-13 ENCOUNTER — Ambulatory Visit (INDEPENDENT_AMBULATORY_CARE_PROVIDER_SITE_OTHER): Payer: BC Managed Care – PPO | Admitting: Internal Medicine

## 2011-01-13 VITALS — BP 116/60 | HR 63 | Temp 98.0°F | Ht 68.0 in | Wt 213.4 lb

## 2011-01-13 DIAGNOSIS — M109 Gout, unspecified: Secondary | ICD-10-CM

## 2011-01-13 DIAGNOSIS — Z0000 Encounter for general adult medical examination without abnormal findings: Secondary | ICD-10-CM

## 2011-01-13 DIAGNOSIS — E119 Type 2 diabetes mellitus without complications: Secondary | ICD-10-CM

## 2011-01-13 MED ORDER — ALLOPURINOL 300 MG PO TABS: 300.0000 mg | ORAL_TABLET | ORAL | Status: DC

## 2011-01-13 MED ORDER — IBUPROFEN 800 MG PO TABS: 800.0000 mg | ORAL_TABLET | Freq: Two times a day (BID) | ORAL | Status: DC | PRN

## 2011-01-13 MED ORDER — ZOLPIDEM TARTRATE 10 MG PO TABS: 10.0000 mg | ORAL_TABLET | Freq: Every evening | ORAL | Status: DC | PRN

## 2011-01-13 MED ORDER — OMEPRAZOLE 20 MG PO CPDR: 20.0000 mg | DELAYED_RELEASE_CAPSULE | Freq: Two times a day (BID) | ORAL | Status: DC

## 2011-01-13 NOTE — Assessment & Plan Note (Signed)
stable overall by hx and exam,t, and pt to continue medical treatment as before   

## 2011-01-13 NOTE — Progress Notes (Signed)
Subjective:    Patient ID: Troy Parsons, male    DOB: [birth year], 50 y.o.   MRN: [id number]  HPI  Here for wellness and f/u;  Overall doing ok;  Pt denies CP, worsening SOB, DOE, wheezing, orthopnea, PND, worsening LE edema, palpitations, dizziness or syncope.  Pt denies neurological change such as new Headache, facial or extremity weakness.  Pt denies polydipsia, polyuria, or low sugar symptoms. Pt states overall good compliance with treatment and medications, good tolerability, and trying to follow lower cholesterol diet.  Pt denies worsening depressive symptoms, suicidal ideation or panic. No fever, wt loss, night sweats, loss of appetite, or other constitutional symptoms.  Pt states good ability with ADL's, low fall risk, home safety reviewed and adequate, no significant changes in hearing or vision, and occasionally active with exercise. Overall lost from 229 to current 213 with better and more active. Past Medical History  Diagnosis Date  . Depression   . Gout   . Hyperlipemia   . Type II or unspecified type diabetes mellitus without mention of complication, not stated as uncontrolled   . Esophageal reflux   . Abnormal liver function   . Allergy    Past Surgical History  Procedure Date  . Appendectomy   . Nasal septum surgery 2010  . Umbilical hernia repair 2009    reports that he has never smoked. He does not have any smokeless tobacco history on file. He reports that he drinks alcohol. He reports that he does not use illicit drugs. family history includes Cancer in his other; Colon cancer in an unspecified family member; Diabetes in his mother and paternal grandfather; Heart disease in his other; Hyperlipidemia in his other; Hypertension in his other; and Stroke in his other. No Known Allergies Current Outpatient Prescriptions on File Prior to Visit  Medication Sig Dispense Refill  . allopurinol (ZYLOPRIM) 300 MG tablet Take 300 mg by mouth every other day.        Marland Kitchen aspirin 81  MG EC tablet Take 81 mg by mouth daily.        Marland Kitchen dicyclomine (BENTYL) 10 MG capsule Take 10 mg by mouth 3 (three) times daily before meals.        Marland Kitchen ibuprofen (ADVIL,MOTRIN) 800 MG tablet TAKE ONE (1) TABLET(S) TWICE DAILY AS NEEDED FOR PAIN  60 tablet  1  . omeprazole (PRILOSEC) 20 MG capsule TAKE ONE (1) CAPSULE(S) TWICE DAILY  60 capsule  5  . zolpidem (AMBIEN) 10 MG tablet Take 10 mg by mouth at bedtime as needed.         Review of Systems Review of Systems  Constitutional: Negative for diaphoresis, activity change, appetite change and unexpected weight change.  HENT: Negative for hearing loss, ear pain, facial swelling, mouth sores and neck stiffness.   Eyes: Negative for pain, redness and visual disturbance.  Respiratory: Negative for shortness of breath and wheezing.   Cardiovascular: Negative for chest pain and palpitations.  Gastrointestinal: Negative for diarrhea, blood in stool, abdominal distention and rectal pain.  Genitourinary: Negative for hematuria, flank pain and decreased urine volume.  Musculoskeletal: Negative for myalgias and joint swelling.  Skin: Negative for color change and wound.  Neurological: Negative for syncope and numbness.  Hematological: Negative for adenopathy.  Psychiatric/Behavioral: Negative for hallucinations, self-injury, decreased concentration and agitation.      Objective:   Physical Exam BP 116/60  Pulse 63  Temp(Src) 98 F (36.7 C) (Oral)  Ht 5\' 8"  (1.727 m)  Wt 213  lb 6 oz (96.786 kg)  BMI 32.44 kg/m2  SpO2 95% Physical Exam  VS noted Constitutional: Pt is oriented to person, place, and time. Appears well-developed and well-nourished.  HENT:  Head: Normocephalic and atraumatic.  Right Ear: External ear normal.  Left Ear: External ear normal.  Nose: Nose normal.  Mouth/Throat: Oropharynx is clear and moist.  Eyes: Conjunctivae and EOM are normal. Pupils are equal, round, and reactive to light.  Neck: Normal range of motion. Neck  supple. No JVD present. No tracheal deviation present.  Cardiovascular: Normal rate, regular rhythm, normal heart sounds and intact distal pulses.   Pulmonary/Chest: Effort normal and breath sounds normal.  Abdominal: Soft. Bowel sounds are normal. There is no tenderness.  Musculoskeletal: Normal range of motion. Exhibits no edema.  Lymphadenopathy:  Has no cervical adenopathy.  Neurological: Pt is alert and oriented to person, place, and time. Pt has normal reflexes. No cranial nerve deficit.  Skin: Skin is warm and dry. No rash noted.  Psychiatric:  Has  normal mood and affect. Behavior is normal.     Assessment & Plan:

## 2011-01-13 NOTE — Patient Instructions (Signed)
Continue all other medications as before All of your medications were refilled You are otherwise up to date with prevention Please go to LAB in the Basement for the blood and/or urine tests to be done on Friday later this week Please call the phone number [phone] (the [employer] System) for results of testing in 2-3 days;  When calling, simply dial the number, and when prompted enter the MRN number above (the Medical Record Number) and the # key, then the message should start. Please return in 1 year for your yearly visit, or sooner if needed, with Lab testing done 3-5 days before

## 2011-01-13 NOTE — Assessment & Plan Note (Signed)
stable overall by hx and exam, most recent data reviewed with pt, and pt to continue medical treatment as before Lab Results  Component Value Date   HGBA1C 5.9 12/08/2007

## 2011-01-16 ENCOUNTER — Other Ambulatory Visit: Payer: Self-pay | Admitting: Internal Medicine

## 2011-01-16 ENCOUNTER — Ambulatory Visit (INDEPENDENT_AMBULATORY_CARE_PROVIDER_SITE_OTHER): Payer: BC Managed Care – PPO | Admitting: Internal Medicine

## 2011-01-16 DIAGNOSIS — M109 Gout, unspecified: Secondary | ICD-10-CM

## 2011-01-16 DIAGNOSIS — Z79899 Other long term (current) drug therapy: Secondary | ICD-10-CM

## 2011-01-16 DIAGNOSIS — Z0000 Encounter for general adult medical examination without abnormal findings: Secondary | ICD-10-CM

## 2011-01-16 DIAGNOSIS — E119 Type 2 diabetes mellitus without complications: Secondary | ICD-10-CM

## 2011-01-16 DIAGNOSIS — E785 Hyperlipidemia, unspecified: Secondary | ICD-10-CM

## 2011-01-16 LAB — BASIC METABOLIC PANEL
BUN: 21 mg/dL (ref 6–23)
Creatinine, Ser: 0.8 mg/dL (ref 0.4–1.5)
GFR: 104.9 mL/min (ref >60.00)

## 2011-01-16 LAB — URIC ACID: Uric Acid, Serum: 7.2 mg/dL (ref 4.0–7.8)

## 2011-01-16 LAB — CBC WITH DIFFERENTIAL/PLATELET
Basophils Relative: 0.7 % (ref 0.0–3.0)
Eosinophils Relative: 1.6 % (ref 0.0–5.0)
HCT: 43.4 % (ref 39.0–52.0)
Lymphs Abs: 1.6 10*3/uL (ref 0.7–4.0)
MCV: 90.7 fl (ref 78.0–100.0)
Monocytes Absolute: 0.5 10*3/uL (ref 0.1–1.0)
Neutrophils Relative %: 50 % (ref 43.0–77.0)
RBC: 4.79 Mil/uL (ref 4.22–5.81)
WBC: 4.3 10*3/uL — ABNORMAL LOW (ref 4.5–10.5)

## 2011-01-16 LAB — URINALYSIS, ROUTINE W REFLEX MICROSCOPIC
Bilirubin Urine: NEGATIVE
Nitrite: NEGATIVE
Total Protein, Urine: NEGATIVE
pH: 6 (ref 5.0–8.0)

## 2011-01-16 LAB — LIPID PANEL
Cholesterol: 217 mg/dL — ABNORMAL HIGH (ref 0–200)
HDL: 43.9 mg/dL (ref >39.00)
VLDL: 36.8 mg/dL (ref 0.0–40.0)

## 2011-01-16 LAB — HEMOGLOBIN A1C: Hgb A1c MFr Bld: 5.8 % (ref 4.6–6.5)

## 2011-01-16 LAB — PSA: PSA: 0.85 ng/mL (ref 0.10–4.00)

## 2011-01-16 LAB — HEPATIC FUNCTION PANEL: Total Bilirubin: 0.5 mg/dL (ref 0.3–1.2)

## 2011-01-16 MED ORDER — ATORVASTATIN CALCIUM 10 MG PO TABS: 10.0000 mg | ORAL_TABLET | Freq: Every day | ORAL | Status: DC

## 2011-01-17 MED ORDER — ROSUVASTATIN CALCIUM 20 MG PO TABS: 20.0000 mg | ORAL_TABLET | Freq: Every day | ORAL | Status: DC

## 2011-01-17 NOTE — Patient Instructions (Signed)
Ok to change to [employer]

## 2011-05-05 ENCOUNTER — Other Ambulatory Visit: Payer: Self-pay

## 2011-05-05 MED ORDER — ALLOPURINOL 300 MG PO TABS: 300.0000 mg | ORAL_TABLET | Freq: Every day | ORAL | Status: DC

## 2012-01-15 ENCOUNTER — Encounter: Payer: Self-pay | Admitting: Internal Medicine

## 2012-01-15 ENCOUNTER — Other Ambulatory Visit (INDEPENDENT_AMBULATORY_CARE_PROVIDER_SITE_OTHER): Payer: 59

## 2012-01-15 ENCOUNTER — Ambulatory Visit (INDEPENDENT_AMBULATORY_CARE_PROVIDER_SITE_OTHER): Payer: 59 | Admitting: Internal Medicine

## 2012-01-15 ENCOUNTER — Other Ambulatory Visit: Payer: Self-pay | Admitting: Internal Medicine

## 2012-01-15 VITALS — BP 108/70 | HR 55 | Temp 98.0°F | Ht 69.0 in | Wt 211.1 lb

## 2012-01-15 DIAGNOSIS — E119 Type 2 diabetes mellitus without complications: Secondary | ICD-10-CM

## 2012-01-15 DIAGNOSIS — Z0000 Encounter for general adult medical examination without abnormal findings: Secondary | ICD-10-CM

## 2012-01-15 LAB — CBC WITH DIFFERENTIAL/PLATELET
Basophils Relative: 0.7 % (ref 0.0–3.0)
Eosinophils Relative: 1.9 % (ref 0.0–5.0)
HCT: 44.9 % (ref 39.0–52.0)
Hemoglobin: 15.1 g/dL (ref 13.0–17.0)
Lymphs Abs: 1.8 10*3/uL (ref 0.7–4.0)
MCV: 90.7 fl (ref 78.0–100.0)
Monocytes Absolute: 0.5 10*3/uL (ref 0.1–1.0)
Neutro Abs: 2.7 10*3/uL (ref 1.4–7.7)
Neutrophils Relative %: 53.5 % (ref 43.0–77.0)
RBC: 4.95 Mil/uL (ref 4.22–5.81)
WBC: 5.1 10*3/uL (ref 4.5–10.5)

## 2012-01-15 LAB — URINALYSIS, ROUTINE W REFLEX MICROSCOPIC
Bilirubin Urine: NEGATIVE
Ketones, ur: NEGATIVE
Total Protein, Urine: NEGATIVE
pH: 6 (ref 5.0–8.0)

## 2012-01-15 LAB — BASIC METABOLIC PANEL
BUN: 17 mg/dL (ref 6–23)
GFR: 110.6 mL/min (ref >60.00)
Potassium: 4.5 mEq/L (ref 3.5–5.1)

## 2012-01-15 LAB — LIPID PANEL
Cholesterol: 227 mg/dL — ABNORMAL HIGH (ref 0–200)
VLDL: 41.6 mg/dL — ABNORMAL HIGH (ref 0.0–40.0)

## 2012-01-15 LAB — HEPATIC FUNCTION PANEL
ALT: 40 U/L (ref 0–53)
Bilirubin, Direct: 0.1 mg/dL (ref 0.0–0.3)
Total Protein: 7 g/dL (ref 6.0–8.3)

## 2012-01-15 MED ORDER — SIMVASTATIN 40 MG PO TABS: 40.0000 mg | ORAL_TABLET | Freq: Every day | ORAL | Status: DC

## 2012-01-15 MED ORDER — IBUPROFEN 800 MG PO TABS: 800.0000 mg | ORAL_TABLET | Freq: Two times a day (BID) | ORAL | Status: DC | PRN

## 2012-01-15 MED ORDER — ALLOPURINOL 300 MG PO TABS: 300.0000 mg | ORAL_TABLET | Freq: Every day | ORAL | Status: DC

## 2012-01-15 NOTE — Assessment & Plan Note (Signed)
asymtp - for a1c, urged wt loss

## 2012-01-15 NOTE — Progress Notes (Signed)
Subjective:    Patient ID: Troy Parsons, male    DOB: 1960/06/03, 51 y.o.   MRN: [id number]  HPI Here for wellness and f/u;  Overall doing ok;  Pt denies CP, worsening SOB, DOE, wheezing, orthopnea, PND, worsening LE edema, palpitations, dizziness or syncope.  Pt denies neurological change such as new Headache, facial or extremity weakness.  Pt denies polydipsia, polyuria, or low sugar symptoms. Pt states overall good compliance with treatment and medications, good tolerability, and trying to follow lower cholesterol diet.  Pt denies worsening depressive symptoms, suicidal ideation or panic. No fever, wt loss, night sweats, loss of appetite, or other constitutional symptoms.  Pt states good ability with ADL's, low fall risk, home safety reviewed and adequate, no significant changes in hearing or vision, and occasionally active with exercise.  No acute complaints.  Does go to the gym 3-4 times per wk.  To see derm for general exam next wk as well.  Did not take the crestor as prescribed last yr, hoping to get by with co-10 and red yeast rice, since the hx of elev LFT's with lipitor Past Medical History  Diagnosis Date  . Depression   . Gout   . Hyperlipemia   . Type II or unspecified type diabetes mellitus without mention of complication, not stated as uncontrolled   . Esophageal reflux   . Abnormal liver function   . Allergy    Past Surgical History  Procedure Date  . Appendectomy   . Nasal septum surgery 2010  . Umbilical hernia repair 2009    reports that he has never smoked. He does not have any smokeless tobacco history on file. He reports that he drinks alcohol. He reports that he does not use illicit drugs. family history includes Cancer in his other; Colon cancer in an unspecified family member; Diabetes in his mother and paternal grandfather; Heart disease in his other; Hyperlipidemia in his other; Hypertension in his other; and Stroke in his other. No Known Allergies Current  Outpatient Prescriptions on File Prior to Visit  Medication Sig Dispense Refill  . [DISCONTINUED] allopurinol (ZYLOPRIM) 300 MG tablet Take 1 tablet (300 mg total) by mouth daily.  90 tablet  3  . omeprazole (PRILOSEC) 20 MG capsule Take 1 capsule (20 mg total) by mouth 2 (two) times daily.  180 capsule  3  . zolpidem (AMBIEN) 10 MG tablet Take 1 tablet (10 mg total) by mouth at bedtime as needed.  30 tablet  5   Review of Systems Review of Systems  Constitutional: Negative for diaphoresis, activity change, appetite change and unexpected weight change.  HENT: Negative for hearing loss, ear pain, facial swelling, mouth sores and neck stiffness.   Eyes: Negative for pain, redness and visual disturbance.  Respiratory: Negative for shortness of breath and wheezing.   Cardiovascular: Negative for chest pain and palpitations.  Gastrointestinal: Negative for diarrhea, blood in stool, abdominal distention and rectal pain.  Genitourinary: Negative for hematuria, flank pain and decreased urine volume.  Musculoskeletal: Negative for myalgias and joint swelling.  Skin: Negative for color change and wound.  Neurological: Negative for syncope and numbness.  Hematological: Negative for adenopathy.  Psychiatric/Behavioral: Negative for hallucinations, self-injury, decreased concentration and agitation.      Objective:   Physical Exam BP 108/70  Pulse 55  Temp 98 F (36.7 C) (Oral)  Ht 5\' 9"  (1.753 m)  Wt 211 lb 2 oz (95.766 kg)  BMI 31.18 kg/m2  SpO2 96% Physical Exam  VS  noted Constitutional: Pt is oriented to person, place, and time. Appears well-developed and well-nourished.  HENT:  Head: Normocephalic and atraumatic.  Right Ear: External ear normal.  Left Ear: External ear normal.  Nose: Nose normal.  Mouth/Throat: Oropharynx is clear and moist.  Eyes: Conjunctivae and EOM are normal. Pupils are equal, round, and reactive to light.  Neck: Normal range of motion. Neck supple. No JVD  present. No tracheal deviation present.  Cardiovascular: Normal rate, regular rhythm, normal heart sounds and intact distal pulses.   Pulmonary/Chest: Effort normal and breath sounds normal.  Abdominal: Soft. Bowel sounds are normal. There is no tenderness.  Musculoskeletal: Normal range of motion. Exhibits no edema.  Lymphadenopathy:  Has no cervical adenopathy.  Neurological: Pt is alert and oriented to person, place, and time. Pt has normal reflexes. No cranial nerve deficit.  Skin: Skin is warm and dry. No rash noted.  Psychiatric:  Has  normal mood and affect. Behavior is normal.     Assessment & Plan:

## 2012-01-15 NOTE — Patient Instructions (Addendum)
Continue all other medications as before; your refills were sent to the pharmacy today Please go to LAB in the Basement for the blood and/or urine tests to be done today You will be contacted by phone if any changes need to be made immediately.  Otherwise, you will receive a letter about your results with an explanation, but please check with MyChart first. Thank you for enrolling in MyChart. Please follow the instructions below to securely access your online medical record. MyChart allows you to send messages to your doctor, view your test results, renew your prescriptions, schedule appointments, and more. To Log into MyChart, please go to [email or web address], and your Username is: [email or web address] Please continue your efforts at being more active, low cholesterol diet, and weight control. Please return in 1 year for your yearly visit, or sooner if needed, with Lab testing done 3-5 days before

## 2012-01-15 NOTE — Assessment & Plan Note (Signed)

## 2012-08-09 ENCOUNTER — Ambulatory Visit (INDEPENDENT_AMBULATORY_CARE_PROVIDER_SITE_OTHER): Payer: 59 | Admitting: Internal Medicine

## 2012-08-09 ENCOUNTER — Other Ambulatory Visit (INDEPENDENT_AMBULATORY_CARE_PROVIDER_SITE_OTHER): Payer: 59

## 2012-08-09 ENCOUNTER — Encounter: Payer: Self-pay | Admitting: Internal Medicine

## 2012-08-09 VITALS — BP 120/80 | HR 61 | Temp 97.5°F | Ht 69.0 in | Wt 218.5 lb

## 2012-08-09 DIAGNOSIS — R1032 Left lower quadrant pain: Secondary | ICD-10-CM

## 2012-08-09 DIAGNOSIS — R7302 Impaired glucose tolerance (oral): Secondary | ICD-10-CM

## 2012-08-09 DIAGNOSIS — E785 Hyperlipidemia, unspecified: Secondary | ICD-10-CM

## 2012-08-09 DIAGNOSIS — Z0000 Encounter for general adult medical examination without abnormal findings: Secondary | ICD-10-CM

## 2012-08-09 DIAGNOSIS — R7309 Other abnormal glucose: Secondary | ICD-10-CM

## 2012-08-09 LAB — URINALYSIS, ROUTINE W REFLEX MICROSCOPIC
Bilirubin Urine: NEGATIVE
Hgb urine dipstick: NEGATIVE
Ketones, ur: NEGATIVE
Total Protein, Urine: NEGATIVE
Urine Glucose: NEGATIVE

## 2012-08-09 LAB — BASIC METABOLIC PANEL
BUN: 19 mg/dL (ref 6–23)
Creatinine, Ser: 0.9 mg/dL (ref 0.4–1.5)
GFR: 98.72 mL/min (ref >60.00)

## 2012-08-09 LAB — CBC WITH DIFFERENTIAL/PLATELET
Basophils Relative: 0.8 % (ref 0.0–3.0)
Eosinophils Relative: 2.7 % (ref 0.0–5.0)
HCT: 44.5 % (ref 39.0–52.0)
Hemoglobin: 15.4 g/dL (ref 13.0–17.0)
Lymphs Abs: 1.7 10*3/uL (ref 0.7–4.0)
MCV: 90.7 fl (ref 78.0–100.0)
Monocytes Absolute: 0.6 10*3/uL (ref 0.1–1.0)
Neutro Abs: 2.4 10*3/uL (ref 1.4–7.7)
RBC: 4.91 Mil/uL (ref 4.22–5.81)
WBC: 4.9 10*3/uL (ref 4.5–10.5)

## 2012-08-09 LAB — LIPID PANEL
HDL: 44.8 mg/dL (ref >39.00)
LDL Cholesterol: 119 mg/dL — ABNORMAL HIGH (ref 0–99)
Total CHOL/HDL Ratio: 4
Triglycerides: 166 mg/dL — ABNORMAL HIGH (ref 0.0–149.0)

## 2012-08-09 LAB — HEPATIC FUNCTION PANEL
Bilirubin, Direct: 0.2 mg/dL (ref 0.0–0.3)
Total Bilirubin: 0.8 mg/dL (ref 0.3–1.2)

## 2012-08-09 LAB — HEMOGLOBIN A1C: Hgb A1c MFr Bld: 6.3 % (ref 4.6–6.5)

## 2012-08-09 MED ORDER — METRONIDAZOLE 250 MG PO TABS: 250.0000 mg | ORAL_TABLET | Freq: Three times a day (TID) | ORAL | Status: DC

## 2012-08-09 MED ORDER — CIPROFLOXACIN HCL 500 MG PO TABS: 500.0000 mg | ORAL_TABLET | Freq: Two times a day (BID) | ORAL | Status: DC

## 2012-08-09 NOTE — Progress Notes (Signed)
Subjective:    Patient ID: Troy Parsons, male    DOB: 10-30-1960, 52 y.o.   MRN: [id number]  HPI  Here to f/u; overall doing ok,  Pt denies chest pain, increased sob or doe, wheezing, orthopnea, PND, increased LE swelling, palpitations, dizziness or syncope.  Pt denies polydipsia, polyuria, or low sugar symptoms such as weakness or confusion improved with po intake.  Pt denies new neurological symptoms such as new headache, or facial or extremity weakness or numbness.   Pt states overall good compliance with meds, has been trying to follow lower cholesterol, diabetic diet, with wt overall stable,  but little exercise however.  Was traveling last wk to [place], c/oLLq  abd pain, wondering if had recurrent episode diverticulitis, had 2 days leftover flagyl from 2 yrs ago, better, then worse again in the past 1-2 days. Had low grade fever, motrin helped pain and fever, + nause, no vomit, eating seemed to make worse, has regular BM's, had some trace blood on tissue x 2 with straining at stool   Last colonscopy feb 2012 - no polyps. Denies urinary symptoms such as dysuria, frequency, urgency, flank pain, hematuria or n/v, fever, chills.  Leaving for [place] in 10 days Past Medical History  Diagnosis Date  . Depression   . Gout   . Hyperlipemia   . Type II or unspecified type diabetes mellitus without mention of complication, not stated as uncontrolled   . Esophageal reflux   . Abnormal liver function   . Allergy    Past Surgical History  Procedure Laterality Date  . Appendectomy    . Nasal septum surgery  2010  . Umbilical hernia repair  2009    reports that he has never smoked. He does not have any smokeless tobacco history on file. He reports that  drinks alcohol. He reports that he does not use illicit drugs. family history includes Cancer in his other; Colon cancer in an unspecified family member; Diabetes in his mother and paternal grandfather; Heart disease in his other;  Hyperlipidemia in his other; Hypertension in his other; and Stroke in his other. Allergies  Allergen Reactions  . Lipitor (Atorvastatin)    Current Outpatient Prescriptions on File Prior to Visit  Medication Sig Dispense Refill  . allopurinol (ZYLOPRIM) 300 MG tablet Take 1 tablet (300 mg total) by mouth daily.  90 tablet  3  . ibuprofen (ADVIL,MOTRIN) 800 MG tablet Take 1 tablet (800 mg total) by mouth 2 (two) times daily as needed for pain.  60 tablet  5  . omeprazole (PRILOSEC) 20 MG capsule Take 1 capsule (20 mg total) by mouth 2 (two) times daily.  180 capsule  3  . simvastatin (ZOCOR) 40 MG tablet Take 1 tablet (40 mg total) by mouth at bedtime.  90 tablet  3  . zolpidem (AMBIEN) 10 MG tablet Take 1 tablet (10 mg total) by mouth at bedtime as needed.  30 tablet  5   No current facility-administered medications on file prior to visit.   Review of Systems  Constitutional: Negative for unexpected weight change, or unusual diaphoresis  HENT: Negative for tinnitus.   Eyes: Negative for photophobia and visual disturbance.  Respiratory: Negative for choking and stridor.   Gastrointestinal: Negative for vomiting and blood in stool.  Genitourinary: Negative for hematuria and decreased urine volume.  Musculoskeletal: Negative for acute joint swelling Skin: Negative for color change and wound.  Neurological: Negative for tremors and numbness other than noted  Psychiatric/Behavioral:  Negative for decreased concentration or  hyperactivity.       Objective:   Physical Exam BP 120/80  Pulse 61  Temp(Src) 97.5 F (36.4 C) (Oral)  Ht 5\' 9"  (1.753 m)  Wt 218 lb 8 oz (99.111 kg)  BMI 32.25 kg/m2  SpO2 94% VS noted,  Constitutional: Pt appears well-developed and well-nourished.  HENT: Head: NCAT.  Right Ear: External ear normal.  Left Ear: External ear normal.  Eyes: Conjunctivae and EOM are normal. Pupils are equal, round, and reactive to light.  Neck: Normal range of motion. Neck  supple.  Cardiovascular: Normal rate and regular rhythm.   Pulmonary/Chest: Effort normal and breath sounds normal.  Abd:  Soft, non-distended, + BS with mild tender to deeper palpation LLQ wtihout mass, guardin  Neurological: Pt is alert. Not confused  Skin: Skin is warm. No erythema.  Psychiatric: Pt behavior is normal. Thought content normal.         Assessment & Plan:

## 2012-08-09 NOTE — Patient Instructions (Signed)
Please take all new medication as prescribed Please continue all other medications as before, and refills have been done if requested. Please have the pharmacy call with any other refills you may need. Please go to the LAB in the Basement (turn left off the elevator) for the tests to be done today You will be contacted by phone if any changes need to be made immediately.  Otherwise, you will receive a letter about your results with an explanation, but please check with MyChart first.  Please remember to sign up for My Chart if you have not done so, as this will be important to you in the future with finding out test results, communicating by private email, and scheduling acute appointments online when needed.  Please return in 6 months, or sooner if needed, with Lab testing done 3-5 days before

## 2012-08-09 NOTE — Assessment & Plan Note (Signed)
C/w presumed diverticulitis by hx and exam, for cipro/flagy, cbc, and does not need colonoscopy f/u at this time, consdier Gi if persists or worsens

## 2012-08-09 NOTE — Assessment & Plan Note (Signed)
stable overall by history and exam, recent data reviewed with pt, and pt to continue medical treatment as before,  to f/u any worsening symptoms or concerns Lab Results  Component Value Date   HGBA1C 6.2 01/15/2012

## 2012-08-09 NOTE — Assessment & Plan Note (Signed)
With intol of liptior due to elev LFT's > 3 times normal, now on zocor; for f/u labs today

## 2012-12-16 ENCOUNTER — Other Ambulatory Visit: Payer: Self-pay

## 2013-01-14 ENCOUNTER — Other Ambulatory Visit (INDEPENDENT_AMBULATORY_CARE_PROVIDER_SITE_OTHER): Payer: 59

## 2013-01-14 DIAGNOSIS — Z0000 Encounter for general adult medical examination without abnormal findings: Secondary | ICD-10-CM

## 2013-01-14 DIAGNOSIS — R7302 Impaired glucose tolerance (oral): Secondary | ICD-10-CM

## 2013-01-14 DIAGNOSIS — R7309 Other abnormal glucose: Secondary | ICD-10-CM

## 2013-01-14 LAB — URINALYSIS, ROUTINE W REFLEX MICROSCOPIC
Bilirubin Urine: NEGATIVE
Hgb urine dipstick: NEGATIVE
Leukocytes, UA: NEGATIVE
Nitrite: NEGATIVE
Specific Gravity, Urine: 1.025 (ref 1.000–1.030)
Total Protein, Urine: NEGATIVE
Urobilinogen, UA: 0.2 (ref 0.0–1.0)

## 2013-01-14 LAB — CBC WITH DIFFERENTIAL/PLATELET
Basophils Absolute: 0 10*3/uL (ref 0.0–0.1)
Basophils Relative: 0.6 % (ref 0.0–3.0)
Eosinophils Absolute: 0.1 10*3/uL (ref 0.0–0.7)
Lymphocytes Relative: 39.1 % (ref 12.0–46.0)
MCHC: 34.3 g/dL (ref 30.0–36.0)
Monocytes Absolute: 0.5 10*3/uL (ref 0.1–1.0)
Monocytes Relative: 10.8 % (ref 3.0–12.0)
Neutrophils Relative %: 47.4 % (ref 43.0–77.0)
Platelets: 210 10*3/uL (ref 150.0–400.0)
RBC: 4.92 Mil/uL (ref 4.22–5.81)

## 2013-01-14 LAB — BASIC METABOLIC PANEL
BUN: 14 mg/dL (ref 6–23)
CO2: 24 mEq/L (ref 19–32)
Chloride: 106 mEq/L (ref 96–112)
Creatinine, Ser: 0.9 mg/dL (ref 0.4–1.5)
Sodium: 138 mEq/L (ref 135–145)

## 2013-01-14 LAB — TSH: TSH: 2.56 u[IU]/mL (ref 0.35–5.50)

## 2013-01-14 LAB — LIPID PANEL
Cholesterol: 167 mg/dL (ref 0–200)
HDL: 42.4 mg/dL (ref >39.00)
LDL Cholesterol: 94 mg/dL (ref 0–99)
VLDL: 30.6 mg/dL (ref 0.0–40.0)

## 2013-01-14 LAB — HEPATIC FUNCTION PANEL
ALT: 66 U/L — ABNORMAL HIGH (ref 0–53)
AST: 46 U/L — ABNORMAL HIGH (ref 0–37)
Bilirubin, Direct: 0.1 mg/dL (ref 0.0–0.3)
Total Protein: 6.9 g/dL (ref 6.0–8.3)

## 2013-01-14 LAB — HEMOGLOBIN A1C: Hgb A1c MFr Bld: 6.3 % (ref 4.6–6.5)

## 2013-01-19 ENCOUNTER — Ambulatory Visit (INDEPENDENT_AMBULATORY_CARE_PROVIDER_SITE_OTHER): Payer: 59 | Admitting: Internal Medicine

## 2013-01-19 ENCOUNTER — Encounter: Payer: Self-pay | Admitting: Internal Medicine

## 2013-01-19 VITALS — BP 120/82 | HR 89 | Temp 97.6°F | Ht 69.0 in | Wt 229.4 lb

## 2013-01-19 DIAGNOSIS — Z0000 Encounter for general adult medical examination without abnormal findings: Secondary | ICD-10-CM

## 2013-01-19 DIAGNOSIS — Z23 Encounter for immunization: Secondary | ICD-10-CM

## 2013-01-19 DIAGNOSIS — R7302 Impaired glucose tolerance (oral): Secondary | ICD-10-CM

## 2013-01-19 MED ORDER — IBUPROFEN 800 MG PO TABS: 800.0000 mg | ORAL_TABLET | Freq: Two times a day (BID) | ORAL | Status: DC | PRN

## 2013-01-19 MED ORDER — ALLOPURINOL 300 MG PO TABS: 300.0000 mg | ORAL_TABLET | Freq: Every day | ORAL | Status: DC

## 2013-01-19 MED ORDER — SIMVASTATIN 40 MG PO TABS: 40.0000 mg | ORAL_TABLET | Freq: Every day | ORAL | Status: DC

## 2013-01-19 NOTE — Progress Notes (Signed)
Pre-visit discussion using our clinic review tool. No additional management support is needed unless otherwise documented below in the visit note.  

## 2013-01-19 NOTE — Assessment & Plan Note (Signed)

## 2013-01-19 NOTE — Progress Notes (Signed)
Subjective:    Patient ID: Troy Parsons, male    DOB: 22-Oct-1960, 52 y.o.   MRN: [id number]  HPI  Here for wellness and f/u;  Overall doing ok;  Pt denies CP, worsening SOB, DOE, wheezing, orthopnea, PND, worsening LE edema, palpitations, dizziness or syncope.  Pt denies neurological change such as new headache, facial or extremity weakness.  Pt denies polydipsia, polyuria, or low sugar symptoms. Pt states overall good compliance with treatment and medications, good tolerability, and has been trying to follow lower cholesterol diet.  Pt denies worsening depressive symptoms, suicidal ideation or panic. No fever, night sweats, wt loss, loss of appetite, or other constitutional symptoms.  Pt states good ability with ADL's, has low fall risk, home safety reviewed and adequate, no other significant changes in hearing or vision, and only occasionally active with exercise, but tryiing to do better, travels freq for work, unable to control diet as much as he would like. Past Medical History  Diagnosis Date  . Depression   . Gout   . Hyperlipemia   . Type II or unspecified type diabetes mellitus without mention of complication, not stated as uncontrolled   . Esophageal reflux   . Abnormal liver function   . Allergy    Past Surgical History  Procedure Laterality Date  . Appendectomy    . Nasal septum surgery  2010  . Umbilical hernia repair  2009    reports that he has never smoked. He does not have any smokeless tobacco history on file. He reports that he drinks alcohol. He reports that he does not use illicit drugs. family history includes Cancer in his other; Colon cancer in an other family member; Diabetes in his mother and paternal grandfather; Heart disease in his other; Hyperlipidemia in his other; Hypertension in his other; Stroke in his other. Allergies  Allergen Reactions  . Lipitor [Atorvastatin]    Current Outpatient Prescriptions on File Prior to Visit  Medication Sig Dispense  Refill  . metroNIDAZOLE (FLAGYL) 250 MG tablet Take 1 tablet (250 mg total) by mouth 3 (three) times daily.  30 tablet  0  . omeprazole (PRILOSEC) 20 MG capsule Take 1 capsule (20 mg total) by mouth 2 (two) times daily.  180 capsule  3  . zolpidem (AMBIEN) 10 MG tablet Take 1 tablet (10 mg total) by mouth at bedtime as needed.  30 tablet  5   No current facility-administered medications on file prior to visit.   Review of Systems  Constitutional: Negative for unexpected weight change, or unusual diaphoresis  HENT: Negative for tinnitus.   Eyes: Negative for photophobia and visual disturbance.  Respiratory: Negative for choking and stridor.   Gastrointestinal: Negative for vomiting and blood in stool.  Genitourinary: Negative for hematuria and decreased urine volume.  Musculoskeletal: Negative for acute joint swelling Skin: Negative for color change and wound.  Neurological: Negative for tremors and numbness other than noted  Psychiatric/Behavioral: Negative for decreased concentration or  hyperactivity.       Objective:   Physical Exam BP 120/82  Pulse 89  Temp(Src) 97.6 F (36.4 C) (Oral)  Ht 5\' 9"  (1.753 m)  Wt 229 lb 6 oz (104.044 kg)  BMI 33.86 kg/m2  SpO2 93% VS noted,  Constitutional: Pt appears well-developed and well-nourished.  HENT: Head: NCAT.  Right Ear: External ear normal.  Left Ear: External ear normal.  Eyes: Conjunctivae and EOM are normal. Pupils are equal, round, and reactive to light.  Neck: Normal range  of motion. Neck supple.  Cardiovascular: Normal rate and regular rhythm.   Pulmonary/Chest: Effort normal and breath sounds normal.  Abd:  Soft, NT, non-distended, + BS Neurological: Pt is alert. Not confused  Skin: Skin is warm. No erythema.  Psychiatric: Pt behavior is normal. Thought content normal.     Assessment & Plan:

## 2013-01-19 NOTE — Patient Instructions (Addendum)
You had the flu shot today Please continue all other medications as before, and refills have been done if requested. Please have the pharmacy call with any other refills you may need. Please continue your efforts at being more active, low cholesterol diet, and weight control. You are otherwise up to date with prevention measures today. Please keep your appointments with your specialists as you have planned - dermatology for the mole check  Please remember to sign up for My Chart if you have not done so, as this will be important to you in the future with finding out test results, communicating by private email, and scheduling acute appointments online when needed.  Please return in 1 year for your yearly visit, or sooner if needed, with Lab testing done 3-5 days before

## 2013-01-19 NOTE — Assessment & Plan Note (Signed)
stable overall by history and exam, recent data reviewed with pt, and pt to continue medical treatment as before,  to f/u any worsening symptoms or concerns Lab Results  Component Value Date   HGBA1C 6.3* 01/14/2013    

## 2013-10-10 ENCOUNTER — Encounter: Payer: Self-pay | Admitting: Gastroenterology

## 2013-10-21 ENCOUNTER — Encounter: Payer: Self-pay | Admitting: Gastroenterology

## 2013-12-05 ENCOUNTER — Ambulatory Visit (INDEPENDENT_AMBULATORY_CARE_PROVIDER_SITE_OTHER): Payer: 59 | Admitting: Gastroenterology

## 2013-12-05 ENCOUNTER — Encounter: Payer: Self-pay | Admitting: Gastroenterology

## 2013-12-05 VITALS — BP 138/90 | HR 72 | Ht 69.0 in | Wt 235.6 lb

## 2013-12-05 DIAGNOSIS — L290 Pruritus ani: Secondary | ICD-10-CM

## 2013-12-05 DIAGNOSIS — K6289 Other specified diseases of anus and rectum: Secondary | ICD-10-CM

## 2013-12-05 NOTE — Patient Instructions (Signed)
Please purchase Preparation-H Suppositories over the counter and place one suppository per rectum at bedtime for seven to ten days.  Please purchase Lotrimin Cream over the counter and apply around the rectum twice daily for seven to ten days.  Please purchase Metamucil over the counter and take as directed once daily.  You are scheduled for a follow up visit with Dr. Fuller Plan for 01-16-2014 at 9:45 am If you need to cancel or reschedule please call [phone]

## 2013-12-05 NOTE — Progress Notes (Signed)
    History of Present Illness: This is a 53 year old male previously followed by Dr. Sharlett Iles. He underwent colonoscopy in February 2012 which was normal. EGD February 2012 showed a small hiatal hernia. He relates an ongoing problem for about 1-1-1/2 years of having small amounts of stool tinged mucus leaking from his rectum within an hour to bowel movement. He also notes significant anal and perianal itching. He is tried over-the-counter hemorrhoidal creams with hydrocortisone which temporarily improved the symptoms but then discontinued creams several months ago. He relates no problems with bowel movements. Denies weight loss, abdominal pain, constipation, diarrhea, change in stool caliber, melena, hematochezia, nausea, vomiting, dysphagia, reflux symptoms, chest pain.  Review of Systems: Pertinent positive and negative review of systems were noted in the above HPI section. All other review of systems were otherwise negative.  Current Medications, Allergies, Past Medical History, Past Surgical History, Family History and Social History were reviewed in [employer] record.  Physical Exam: General: Well developed , well nourished, no acute distress Head: Normocephalic and atraumatic Eyes:  sclerae anicteric, EOMI Ears: Normal auditory acuity Mouth: No deformity or lesions Neck: Supple, no masses or thyromegaly Lungs: Clear throughout to auscultation Heart: Regular rate and rhythm; no murmurs, rubs or bruits Abdomen: Soft, non tender and non distended. No masses, hepatosplenomegaly or hernias noted. Normal Bowel sounds Rectal: Mild perianal erythema digital exam reveals mild anal canal tenderness, no lesions and 1+ Hemoccult positive brown stool Musculoskeletal: Symmetrical with no gross deformities  Skin: No lesions on visible extremities Pulses:  Normal pulses noted Extremities: No clubbing, cyanosis, edema or deformities noted Neurological: Alert oriented x 4,  grossly nonfocal Cervical Nodes:  No significant cervical adenopathy Inguinal Nodes: No significant inguinal adenopathy Psychological:  Alert and cooperative. Normal mood and affect  Assessment and Recommendations:  1. Anal and perianal discomfort and itching. Rectal leakage following bowel movements. Hemoccult-positive stool. Possible internal hemorrhoids. Possible Candida dermatitis. Standard rectal care instructions. Preparation H suppositories for 7-10 days. Lotrimin cream twice daily for 7-10 days. Increase fiber intake with Citrucel daily. REV in 1 month. Consider trial of probiotic. Consider colonoscopy or flexible sigmoidoscopy.

## 2014-01-16 ENCOUNTER — Ambulatory Visit: Payer: 59 | Admitting: Gastroenterology

## 2014-01-20 ENCOUNTER — Other Ambulatory Visit: Payer: Self-pay | Admitting: Internal Medicine

## 2014-01-24 ENCOUNTER — Other Ambulatory Visit (INDEPENDENT_AMBULATORY_CARE_PROVIDER_SITE_OTHER): Payer: 59

## 2014-01-24 ENCOUNTER — Ambulatory Visit (INDEPENDENT_AMBULATORY_CARE_PROVIDER_SITE_OTHER): Payer: 59 | Admitting: Internal Medicine

## 2014-01-24 ENCOUNTER — Encounter: Payer: Self-pay | Admitting: Internal Medicine

## 2014-01-24 VITALS — BP 112/78 | HR 65 | Temp 98.2°F | Ht 69.0 in | Wt 232.5 lb

## 2014-01-24 DIAGNOSIS — Z0000 Encounter for general adult medical examination without abnormal findings: Secondary | ICD-10-CM

## 2014-01-24 DIAGNOSIS — R7302 Impaired glucose tolerance (oral): Secondary | ICD-10-CM

## 2014-01-24 LAB — URINALYSIS, ROUTINE W REFLEX MICROSCOPIC
Bilirubin Urine: NEGATIVE
Hgb urine dipstick: NEGATIVE
Ketones, ur: NEGATIVE
Leukocytes, UA: NEGATIVE
NITRITE: NEGATIVE
PH: 5.5 (ref 5.0–8.0)
RBC / HPF: NONE SEEN (ref 0–?)
SPECIFIC GRAVITY, URINE: 1.01 (ref 1.000–1.030)
TOTAL PROTEIN, URINE-UPE24: NEGATIVE
Urine Glucose: NEGATIVE
Urobilinogen, UA: 0.2 (ref 0.0–1.0)
WBC UA: NONE SEEN (ref 0–?)

## 2014-01-24 LAB — HEPATIC FUNCTION PANEL
ALBUMIN: 3.9 g/dL (ref 3.5–5.2)
ALT: 73 U/L — AB (ref 0–53)
AST: 52 U/L — ABNORMAL HIGH (ref 0–37)
Alkaline Phosphatase: 58 U/L (ref 39–117)
Bilirubin, Direct: 0.1 mg/dL (ref 0.0–0.3)
TOTAL PROTEIN: 6.3 g/dL (ref 6.0–8.3)
Total Bilirubin: 0.5 mg/dL (ref 0.2–1.2)

## 2014-01-24 LAB — CBC WITH DIFFERENTIAL/PLATELET
BASOS PCT: 0.7 % (ref 0.0–3.0)
Basophils Absolute: 0 10*3/uL (ref 0.0–0.1)
EOS PCT: 2.3 % (ref 0.0–5.0)
Eosinophils Absolute: 0.1 10*3/uL (ref 0.0–0.7)
HCT: 42.7 % (ref 39.0–52.0)
HEMOGLOBIN: 14.2 g/dL (ref 13.0–17.0)
LYMPHS PCT: 36.5 % (ref 12.0–46.0)
Lymphs Abs: 1.8 10*3/uL (ref 0.7–4.0)
MCHC: 33.2 g/dL (ref 30.0–36.0)
MCV: 89.7 fl (ref 78.0–100.0)
MONOS PCT: 11.1 % (ref 3.0–12.0)
Monocytes Absolute: 0.5 10*3/uL (ref 0.1–1.0)
NEUTROS PCT: 49.4 % (ref 43.0–77.0)
Neutro Abs: 2.4 10*3/uL (ref 1.4–7.7)
Platelets: 210 10*3/uL (ref 150.0–400.0)
RBC: 4.76 Mil/uL (ref 4.22–5.81)
RDW: 13.9 % (ref 11.5–15.5)
WBC: 4.9 10*3/uL (ref 4.0–10.5)

## 2014-01-24 LAB — BASIC METABOLIC PANEL
BUN: 12 mg/dL (ref 6–23)
CHLORIDE: 105 meq/L (ref 96–112)
CO2: 27 meq/L (ref 19–32)
CREATININE: 0.8 mg/dL (ref 0.4–1.5)
Calcium: 9 mg/dL (ref 8.4–10.5)
GFR: 102.2 mL/min (ref >60.00)
Glucose, Bld: 137 mg/dL — ABNORMAL HIGH (ref 70–99)
Potassium: 4.3 mEq/L (ref 3.5–5.1)
Sodium: 136 mEq/L (ref 135–145)

## 2014-01-24 LAB — LIPID PANEL
Cholesterol: 161 mg/dL (ref 0–200)
HDL: 43.9 mg/dL (ref >39.00)
LDL Cholesterol: 85 mg/dL (ref 0–99)
NonHDL: 117.1
Total CHOL/HDL Ratio: 4
Triglycerides: 163 mg/dL — ABNORMAL HIGH (ref 0.0–149.0)
VLDL: 32.6 mg/dL (ref 0.0–40.0)

## 2014-01-24 LAB — HEMOGLOBIN A1C: HEMOGLOBIN A1C: 7.4 % — AB (ref 4.6–6.5)

## 2014-01-24 LAB — PSA: PSA: 0.79 ng/mL (ref 0.10–4.00)

## 2014-01-24 LAB — TSH: TSH: 2.92 u[IU]/mL (ref 0.35–4.50)

## 2014-01-24 MED ORDER — SIMVASTATIN 40 MG PO TABS: 40.0000 mg | ORAL_TABLET | Freq: Every day | ORAL | Status: DC

## 2014-01-24 MED ORDER — OMEPRAZOLE 20 MG PO CPDR: 20.0000 mg | DELAYED_RELEASE_CAPSULE | Freq: Two times a day (BID) | ORAL | Status: DC

## 2014-01-24 MED ORDER — ALLOPURINOL 300 MG PO TABS: 300.0000 mg | ORAL_TABLET | Freq: Every day | ORAL | Status: DC

## 2014-01-24 MED ORDER — IBUPROFEN 800 MG PO TABS: 800.0000 mg | ORAL_TABLET | Freq: Two times a day (BID) | ORAL | Status: DC | PRN

## 2014-01-24 NOTE — Assessment & Plan Note (Signed)
Asymtp, for f/u a1c as well

## 2014-01-24 NOTE — Progress Notes (Signed)
Subjective:    Patient ID: Troy Parsons, male    DOB: 1960-06-13, 53 y.o.   MRN: [id number]  HPI  Here for wellness and f/u;  Overall doing ok;  Pt denies CP, worsening SOB, DOE, wheezing, orthopnea, PND, worsening LE edema, palpitations, dizziness or syncope.  Pt denies neurological change such as new headache, facial or extremity weakness.  Pt denies polydipsia, polyuria, or low sugar symptoms. Pt states overall good compliance with treatment and medications, good tolerability, and has been trying to follow lower cholesterol diet.  Pt denies worsening depressive symptoms, suicidal ideation or panic. No fever, night sweats, wt loss, loss of appetite, or other constitutional symptoms.  Pt states good ability with ADL's, has low fall risk, home safety reviewed and adequate, no other significant changes in hearing or vision, and only occasionally active with exercise.  incidnetly with URi symptoms, mild, clearish fluid x 2 days. Past Medical History  Diagnosis Date  . Depression   . Gout   . Hyperlipemia   . Type II or unspecified type diabetes mellitus without mention of complication, not stated as uncontrolled   . Esophageal reflux   . Abnormal liver function   . Allergy   . Hemorrhoids   . Hiatal hernia   . Fatty liver    Past Surgical History  Procedure Laterality Date  . Appendectomy    . Nasal septum surgery  2010  . Umbilical hernia repair  2009    reports that he has never smoked. He has never used smokeless tobacco. He reports that he drinks alcohol. He reports that he does not use illicit drugs. family history includes Cancer in his other; Colon cancer in an other family member; Diabetes in his mother and paternal grandfather; Heart disease in his other; Hyperlipidemia in his other; Hypertension in his other; Stroke in his other. Allergies  Allergen Reactions  . Lipitor [Atorvastatin]    No current outpatient prescriptions on file prior to visit.   No current  facility-administered medications on file prior to visit.   Wt Readings from Last 3 Encounters:  01/24/14 232 lb 8 oz (105.461 kg)  12/05/13 235 lb 9.6 oz (106.867 kg)  01/19/13 229 lb 6 oz (104.044 kg)   Review of Systems Constitutional: Negative for increased diaphoresis, other activity, appetite or other siginficant weight change  HENT: Negative for worsening hearing loss, ear pain, facial swelling, mouth sores and neck stiffness.   Eyes: Negative for other worsening pain, redness or visual disturbance.  Respiratory: Negative for shortness of breath and wheezing.   Cardiovascular: Negative for chest pain and palpitations.  Gastrointestinal: Negative for diarrhea, blood in stool, abdominal distention or other pain Genitourinary: Negative for hematuria, flank pain or change in urine volume.  Musculoskeletal: Negative for myalgias or other joint complaints.  Skin: Negative for color change and wound.  Neurological: Negative for syncope and numbness. other than noted Hematological: Negative for adenopathy. or other swelling Psychiatric/Behavioral: Negative for hallucinations, self-injury, decreased concentration or other worsening agitation.      Objective:   Physical Exam BP 112/78 mmHg  Pulse 65  Temp(Src) 98.2 F (36.8 C) (Oral)  Ht 5\' 9"  (1.753 m)  Wt 232 lb 8 oz (105.461 kg)  BMI 34.32 kg/m2  SpO2 96% VS noted,  Constitutional: Pt is oriented to person, place, and time. Appears well-developed and well-nourished.  Head: Normocephalic and atraumatic.  Right Ear: External ear normal.  Left Ear: External ear normal.  Nose: Nose normal.  Mouth/Throat: Oropharynx is  clear and moist.  Eyes: Conjunctivae and EOM are normal. Pupils are equal, round, and reactive to light.  Neck: Normal range of motion. Neck supple. No JVD present. No tracheal deviation present.  Cardiovascular: Normal rate, regular rhythm, normal heart sounds and intact distal pulses.   Pulmonary/Chest: Effort  normal and breath sounds without rales or wheezing  Abdominal: Soft. Bowel sounds are normal. NT. No HSM  Musculoskeletal: Normal range of motion. Exhibits no edema.  Lymphadenopathy:  Has no cervical adenopathy.  Neurological: Pt is alert and oriented to person, place, and time. Pt has normal reflexes. No cranial nerve deficit. Motor grossly intact Skin: Skin is warm and dry. No rash noted.  Psychiatric:  Has normal mood and affect. Behavior is normal.     Assessment & Plan:

## 2014-01-24 NOTE — Patient Instructions (Signed)
Please continue all other medications as before, and refills have been done if requested.  Please have the pharmacy call with any other refills you may need.  Please continue your efforts at being more active, low cholesterol diet, and weight control.  You are otherwise up to date with prevention measures today.  Please keep your appointments with your specialists as you may have planned  Your EKG was OK today  Please go to the LAB in the Basement (turn left off the elevator) for the tests to be done today  You will be contacted by phone if any changes need to be made immediately.  Otherwise, you will receive a letter about your results with an explanation, but please check with MyChart first.  Please remember to sign up for MyChart if you have not done so, as this will be important to you in the future with finding out test results, communicating by private email, and scheduling acute appointments online when needed.  Please return in 1 year for your yearly visit, or sooner if needed, with Lab testing done 3-5 days before  

## 2014-01-24 NOTE — Progress Notes (Signed)
Pre visit review using our clinic review tool, if applicable. No additional management support is needed unless otherwise documented below in the visit note. 

## 2014-01-24 NOTE — Assessment & Plan Note (Signed)

## 2014-01-25 ENCOUNTER — Telehealth: Payer: Self-pay | Admitting: Internal Medicine

## 2014-01-25 ENCOUNTER — Other Ambulatory Visit: Payer: Self-pay | Admitting: Internal Medicine

## 2014-01-25 DIAGNOSIS — E119 Type 2 diabetes mellitus without complications: Secondary | ICD-10-CM

## 2014-01-25 MED ORDER — ASPIRIN EC 81 MG PO TBEC: 81.0000 mg | DELAYED_RELEASE_TABLET | Freq: Every day | ORAL | Status: DC

## 2014-01-25 MED ORDER — METFORMIN HCL ER 500 MG PO TB24: 500.0000 mg | ORAL_TABLET | Freq: Every day | ORAL | Status: DC

## 2014-01-25 NOTE — Telephone Encounter (Signed)
Metformin sent erx  Please also ask to start ASA 81 mg per day, as this will help reduce risk of heart disease and stroke in future

## 2014-01-25 NOTE — Telephone Encounter (Signed)
Patient informed. 

## 2014-01-25 NOTE — Telephone Encounter (Signed)
Called left message to call back 

## 2014-02-14 ENCOUNTER — Encounter: Payer: 59 | Attending: Internal Medicine | Admitting: Dietician

## 2014-02-14 ENCOUNTER — Encounter: Payer: Self-pay | Admitting: Dietician

## 2014-02-14 DIAGNOSIS — Z713 Dietary counseling and surveillance: Secondary | ICD-10-CM | POA: Diagnosis not present

## 2014-02-14 DIAGNOSIS — E119 Type 2 diabetes mellitus without complications: Secondary | ICD-10-CM | POA: Diagnosis present

## 2014-02-14 NOTE — Addendum Note (Signed)
Addended by: Clydell Hakim on: 02/14/2014 02:33 PM   Modules accepted: Medications

## 2014-02-14 NOTE — Patient Instructions (Signed)
Plan:  Aim for 2-3 Carb Choices per meal (30-45 grams) +/- 1 either way  Aim for 0-2 Carbs per snack if hungry  Include protein in moderation with your meals and snacks Consider reading food labels for Total Carbohydrate and Fat Grams of foods Continue regular exercise 30 minutes 5 x per week and increasing as tolerated Consider checking BG at alternate times per day as directed by MD  Consider taking medication as directed by MD

## 2014-02-14 NOTE — Progress Notes (Signed)
Diabetes Self-Management Education  Visit Type:  New Diabetes- initial visit  Appt. Start Time: 1045 Appt. End Time: 1200  02/14/2014  Mr. Troy Parsons, identified by name and date of birth, is a 54 y.o. male with a diagnosis of Diabetes: Type 2.  Other people present during visit:  Spouse/SO   ASSESSMENT  There were no vitals taken for this visit. There is no weight on file to calculate BMI.  Initial Visit Information:  Are you currently following a meal plan?: Yes What type of meal plan do you follow?: Low Carbohydrate, no sugar, no etoh in January then decreased Are you taking your medications as prescribed?: Yes Are you checking your feet?: Yes How many days per week are you checking your feet?: 2 How often do you need to have someone help you when you read instructions, pamphlets, or other written materials from your doctor or pharmacy?: 1 - Never What is the last grade level you completed in school?: 4 year college  Psychosocial:     Patient Belief/Attitude about Diabetes: Other (comment) Self-care barriers: None Self-management support: Doctor's office, Friends Other persons present: Spouse/SO Patient Concerns: Nutrition/Meal planning Special Needs: None Preferred Learning Style: No preference indicated Learning Readiness: Ready  Complications:   Last HgB A1C per patient/outside source: 7.4 % How often do you check your blood sugar?: 0 times/day (not testing) Have you had a dilated eye exam in the past 12 months?: Yes Have you had a dental exam in the past 12 months?: Yes  Diet Intake:  Breakfast: smoothies made with yogurt, frozen berries, almond milk, chia seeds, greens, lemon, stevia Snack (morning): [employer] when traveling Lunch: salad with chicken or soup and salad combo, rare sandwich Snack (afternoon): nuts Dinner: roasted chicken breast, sauteed veges or soup or steak/fish, vege, dessert Snack (evening): none Beverage(s): Carbonated water, green tea  with stevia, beer and wine is "biggest challenge"  Exercise:  Exercise: Other (comment) (Gym 5x/week & works with trainer) Moderate Exercise amount of time (min / week): 150  Individualized Plan for Diabetes Self-Management Training:   Learning Objective:  Patient will have a greater understanding of diabetes self-management.  Patient education plan per assessed needs and concerns is to attend individual sessions for     Education Topics Reviewed with Patient Today:  Factors that contribute to the development of diabetes Role of diet in the treatment of diabetes and the relationship between the three main macronutrients and blood glucose level, Food label reading, portion sizes and measuring food., Carbohydrate counting, Reviewed blood glucose goals for pre and post meals and how to evaluate the patients' food intake on their blood glucose level., Effects of alcohol on blood glucose and safety factors with consumption of alcohol., Information on hints to eating out and maintain blood glucose control. Role of exercise on diabetes management, blood pressure control and cardiac health. Reviewed patients medication for diabetes, action, purpose, timing of dose and side effects. Identified appropriate SMBG and/or A1C goals.   Relationship between chronic complications and blood glucose control        PATIENTS GOALS/Plan (Developed by the patient):  Nutrition: Follow meal plan discussed Physical Activity: Exercise 5-7 days per week Medications: take my medication as prescribed Health Coping: Not Applicable  Plan:   Patient Instructions  Plan:  Aim for 2-3 Carb Choices per meal (30-45 grams) +/- 1 either way  Aim for 0-2 Carbs per snack if hungry  Include protein in moderation with your meals and snacks Consider reading food labels  for Total Carbohydrate and Fat Grams of foods Continue regular exercise 30 minutes 5 x per week and increasing as tolerated Consider checking BG at  alternate times per day as directed by MD  Consider taking medication as directed by MD     Expected Outcomes:  Demonstrated interest in learning. Expect positive outcomes  Education material provided: Living Well with Diabetes, Food label handouts, A1C conversion sheet, Meal plan card, Snack sheet and Carbohydrate counting sheet  If problems or questions, patient to contact team via:  Phone and Email  Future DSME appointment: PRN

## 2014-02-14 NOTE — Progress Notes (Deleted)
Appt start time: {Time; Appointment:21385} end time:  {Time; Appointment:21385}.  Assessment:  Patient was seen on  *** for individual diabetes education. ***  Patient Education Plan per assessed needs and concerns is to attend individual session for Diabetes Self Management Education.  Current HbA1c: 7.4%  Preferred Learning Style: ***  Auditory  Visual  Hands on  Learning Readiness:  Ready  MEDICATIONS: ***  DIETARY INTAKE:  Usual eating pattern includes *** meals and *** snacks per day.  Everyday foods include ***.  Avoided foods include ***.    24-hr recall:  B ( AM): ***  Snk ( AM): ***  L ( PM): *** Snk ( PM): *** D ( PM): *** Snk ( PM): *** Beverages: ***  Usual physical activity: go to the gym 4-5 days a week and works out with a Clinical research associate.  When traveling (2-3 days/week will try to walk)  Estimated energy needs: *** calories *** g carbohydrates *** g protein *** g fat  Progress Towards Goal(s):  {Desc; Goals Progress:21388}.   Nutritional Diagnosis:  {CHL AMB NUTRITIONAL DIAGNOSIS:[phone]}    Intervention:  Nutrition counseling provided.  Discussed diabetes disease process and treatment options.  Discussed physiology of diabetes and role of obesity on insulin resistance.  Encouraged moderate weight reduction to improve glucose levels.  Discussed role of medications and diet in glucose control  Provided education on macronutrients on glucose levels.  Provided education on carb counting, importance of regularly scheduled meals/snacks, and meal planning  Discussed effects of physical activity on glucose levels and long-term glucose control.  Recommended 150 minutes of physical activity/week.  Reviewed patient medications.  Discussed role of medication on blood glucose and possible side effects  Discussed blood glucose monitoring and interpretation.  Discussed recommended target ranges and individual ranges.    Described short-term complications:  hyper- and hypo-glycemia.  Discussed causes,symptoms, and treatment options.  Discussed prevention, detection, and treatment of long-term complications.  Discussed the role of prolonged elevated glucose levels on body systems.  Discussed role of stress on blood glucose levels and discussed strategies to manage psychosocial issues.  Discussed recommendations for long-term diabetes self-care.  Established checklist for medical, dental, and emotional self-care.  Teaching Method Utilized: *** Visual Auditory Hands on  Handouts given during visit include:  ***  ***  Barriers to learning/adherence to lifestyle change: ***  Diabetes self-care support plan:   [facility] support group  ***  Demonstrated degree of understanding via:  Teach Back   Monitoring/Evaluation:  Dietary intake, exercise, ***, and body weight {follow up:15908}.

## 2014-02-28 ENCOUNTER — Encounter: Payer: Self-pay | Admitting: Internal Medicine

## 2014-02-28 ENCOUNTER — Other Ambulatory Visit: Payer: Self-pay | Admitting: *Deleted

## 2014-02-28 MED ORDER — ACCU-CHEK AVIVA PLUS W/DEVICE KIT: PACK | Status: DC

## 2014-02-28 MED ORDER — GLUCOSE BLOOD VI STRP: 1.0000 | ORAL_STRIP | Freq: Two times a day (BID) | Status: DC

## 2014-02-28 MED ORDER — ACCU-CHEK FASTCLIX LANCETS MISC: Status: DC

## 2014-02-28 NOTE — Telephone Encounter (Signed)
Sent email needing rx sent to pharmacy for accu chek aviva plus monitor & supplies...Troy Parsons

## 2014-06-23 ENCOUNTER — Other Ambulatory Visit (INDEPENDENT_AMBULATORY_CARE_PROVIDER_SITE_OTHER): Payer: 59

## 2014-06-23 ENCOUNTER — Ambulatory Visit (INDEPENDENT_AMBULATORY_CARE_PROVIDER_SITE_OTHER): Payer: 59 | Admitting: Internal Medicine

## 2014-06-23 ENCOUNTER — Encounter: Payer: Self-pay | Admitting: Internal Medicine

## 2014-06-23 VITALS — BP 138/88 | HR 94 | Temp 98.7°F | Ht 69.0 in | Wt 234.0 lb

## 2014-06-23 DIAGNOSIS — E119 Type 2 diabetes mellitus without complications: Secondary | ICD-10-CM | POA: Diagnosis not present

## 2014-06-23 DIAGNOSIS — M545 Low back pain, unspecified: Secondary | ICD-10-CM

## 2014-06-23 DIAGNOSIS — E785 Hyperlipidemia, unspecified: Secondary | ICD-10-CM | POA: Diagnosis not present

## 2014-06-23 LAB — BASIC METABOLIC PANEL
BUN: 18 mg/dL (ref 6–23)
CHLORIDE: 101 meq/L (ref 96–112)
CO2: 29 mEq/L (ref 19–32)
CREATININE: 0.9 mg/dL (ref 0.40–1.50)
Calcium: 10 mg/dL (ref 8.4–10.5)
GFR: 94.23 mL/min (ref >60.00)
GLUCOSE: 115 mg/dL — AB (ref 70–99)
Potassium: 4.1 mEq/L (ref 3.5–5.1)
SODIUM: 136 meq/L (ref 135–145)

## 2014-06-23 LAB — HEPATIC FUNCTION PANEL
ALK PHOS: 63 U/L (ref 39–117)
ALT: 84 U/L — ABNORMAL HIGH (ref 0–53)
AST: 56 U/L — ABNORMAL HIGH (ref 0–37)
Albumin: 4.3 g/dL (ref 3.5–5.2)
Bilirubin, Direct: 0.1 mg/dL (ref 0.0–0.3)
Total Bilirubin: 0.6 mg/dL (ref 0.2–1.2)
Total Protein: 6.8 g/dL (ref 6.0–8.3)

## 2014-06-23 LAB — LIPID PANEL
CHOL/HDL RATIO: 4
Cholesterol: 170 mg/dL (ref 0–200)
HDL: 45.7 mg/dL (ref >39.00)
NonHDL: 124.3
TRIGLYCERIDES: 322 mg/dL — AB (ref 0.0–149.0)
VLDL: 64.4 mg/dL — AB (ref 0.0–40.0)

## 2014-06-23 LAB — URINALYSIS, ROUTINE W REFLEX MICROSCOPIC
Bilirubin Urine: NEGATIVE
Hgb urine dipstick: NEGATIVE
Ketones, ur: NEGATIVE
Leukocytes, UA: NEGATIVE
NITRITE: NEGATIVE
Specific Gravity, Urine: 1.01 (ref 1.000–1.030)
TOTAL PROTEIN, URINE-UPE24: NEGATIVE
UROBILINOGEN UA: 0.2 (ref 0.0–1.0)
Urine Glucose: NEGATIVE
pH: 6.5 (ref 5.0–8.0)

## 2014-06-23 LAB — LDL CHOLESTEROL, DIRECT: Direct LDL: 90 mg/dL

## 2014-06-23 LAB — HEMOGLOBIN A1C: Hgb A1c MFr Bld: 7 % — ABNORMAL HIGH (ref 4.6–6.5)

## 2014-06-23 MED ORDER — CYCLOBENZAPRINE HCL 5 MG PO TABS: 5.0000 mg | ORAL_TABLET | Freq: Three times a day (TID) | ORAL | Status: DC | PRN

## 2014-06-23 MED ORDER — PREDNISONE 10 MG PO TABS: ORAL_TABLET | ORAL | Status: DC

## 2014-06-23 MED ORDER — TRAMADOL HCL 50 MG PO TABS: 50.0000 mg | ORAL_TABLET | Freq: Four times a day (QID) | ORAL | Status: DC | PRN

## 2014-06-23 NOTE — Patient Instructions (Signed)
Please take all new medication as prescribed - the tramadol for pain  Please take all new medication as prescribed - the muscle relaxer and prednisone - sent to the pharmacy  Please continue all other medications as before, and refills have been done if requested.  Please have the pharmacy call with any other refills you may need.  Please continue your efforts at being more active, low cholesterol diabetic diet, and weight control.  Please keep your appointments with your specialists as you may have planned  Please go to the LAB in the Basement (turn left off the elevator) for the tests to be done today  You will be contacted by phone if any changes need to be made immediately.  Otherwise, you will receive a letter about your results with an explanation, but please check with MyChart first.  Please remember to sign up for MyChart if you have not done so, as this will be important to you in the future with finding out test results, communicating by private email, and scheduling acute appointments online when needed.

## 2014-06-23 NOTE — Assessment & Plan Note (Signed)
stable overall by history and exam, recent data reviewed with pt, and pt to continue medical treatment as before,  to f/u any worsening symptoms or concerns Lab Results  Component Value Date   LDLCALC 85 01/24/2014    for f/u lab, goal ld < 70

## 2014-06-23 NOTE — Progress Notes (Signed)
Subjective:    Patient ID: Troy Parsons, male    DOB: [birth year], 54 y.o.   MRN: [id number]  HPI  Here with 3 wks onset midline lower back pain , dull, intermittent, worse at night with lying down, no worse with sitting, standing made feel better. no bowel or bladder change, fever, wt loss,  worsening LE pain/numbness/weakness, gait change or falls. No obvious cause for hte pain.  No falls. No fever, Denies urinary symptoms such as dysuria, frequency, urgency, flank pain, hematuria or n/v, fever, chills.  Denies worsening reflux, abd pain, dysphagia, n/v, bowel change or blood.  Wondering if could be kidney issue related to omeprazole, so stopped the PPI and may be somewhat better.  Also feels he may pain in the ureter since he pain in the area shooting down. , or even abd hernia pain radiating to the back. Pt denies chest pain, increased sob or doe, wheezing, orthopnea, PND, increased LE swelling, palpitations, dizziness or syncope.  Pt denies new neurological symptoms such as new headache, or facial or extremity weakness or numbness.   Pt denies polydipsia, polyuria, or low sugar symptoms such as weakness or confusion improved with po intake.  Pt states overall good compliance with meds, trying to follow lower cholesterol, diabetic diet, wt overall stable but little exercise however.   Tolerating the metformin well Past Medical History  Diagnosis Date  . Depression   . Gout   . Hyperlipemia   . Type II or unspecified type diabetes mellitus without mention of complication, not stated as uncontrolled   . Esophageal reflux   . Abnormal liver function   . Allergy   . Hemorrhoids   . Hiatal hernia   . Fatty liver    Past Surgical History  Procedure Laterality Date  . Appendectomy    . Nasal septum surgery  2010  . Umbilical hernia repair  2009    reports that he has never smoked. He has never used smokeless tobacco. He reports that he drinks alcohol. He reports that he does not use illicit  drugs. family history includes Cancer in his other; Colon cancer in an other family member; Diabetes in his mother and paternal grandfather; Heart disease in his other; Hyperlipidemia in his other; Hypertension in his other; Stroke in his other. Allergies  Allergen Reactions  . Lipitor [Atorvastatin]    Current Outpatient Prescriptions on File Prior to Visit  Medication Sig Dispense Refill  . ACCU-CHEK FASTCLIX LANCETS MISC Use to help check blood sugars twice a day Dx E11.9 102 each 3  . allopurinol (ZYLOPRIM) 300 MG tablet Take 1 tablet (300 mg total) by mouth daily. 90 tablet 3  . aspirin EC 81 MG tablet Take 1 tablet (81 mg total) by mouth daily. 90 tablet 11  . Blood Glucose Monitoring Suppl (ACCU-CHEK AVIVA PLUS) W/DEVICE KIT Use to check blood sugars twice a day Dx E11.9 1 kit 0  . glucose blood test strip 1 each by Other route 2 (two) times daily. Use to check blood sugars twice a day Dx E11.9 100 each 3  . ibuprofen (ADVIL,MOTRIN) 800 MG tablet Take 1 tablet (800 mg total) by mouth 2 (two) times daily as needed. 60 tablet 5  . metFORMIN (GLUCOPHAGE-XR) 500 MG 24 hr tablet Take 1 tablet (500 mg total) by mouth daily with breakfast. 90 tablet 3  . omeprazole (PRILOSEC) 20 MG capsule Take 1 capsule (20 mg total) by mouth 2 (two) times daily. 180 capsule 3  .  simvastatin (ZOCOR) 40 MG tablet Take 1 tablet (40 mg total) by mouth at bedtime. 90 tablet 3   No current facility-administered medications on file prior to visit.   Review of Systems  Constitutional: Negative for unusual diaphoresis or night sweats HENT: Negative for ringing in ear or discharge Eyes: Negative for double vision or worsening visual disturbance.  Respiratory: Negative for choking and stridor.   Gastrointestinal: Negative for vomiting or other signifcant bowel change Genitourinary: Negative for hematuria or change in urine volume.  Musculoskeletal: Negative for other MSK pain or swelling Skin: Negative for  color change and worsening wound.  Neurological: Negative for tremors and numbness other than noted  Psychiatric/Behavioral: Negative for decreased concentration or agitation other than above       Objective:   Physical Exam BP 138/88 mmHg  Pulse 94  Temp(Src) 98.7 F (37.1 C) (Oral)  Ht _0  (1.753 m)  Wt 234 lb (106.142 kg)  BMI 34.54 kg/m2  SpO2 96% VS noted,  Constitutional: Pt appears in no significant distress HENT: Head: NCAT.  Right Ear: External ear normal.  Left Ear: External ear normal.  Eyes: . Pupils are equal, round, and reactive to light. Conjunctivae and EOM are normal Neck: Normal range of motion. Neck supple.  Cardiovascular: Normal rate and regular rhythm.   Pulmonary/Chest: Effort normal and breath sounds without rales or wheezing.  Abd:  Soft, NT, ND, + BS Spine  midl tender lower lumbar l4 and l5 areas, no rash, sweling Neurological: Pt is alert. Not confused , motor 5/5 intact, sens/dtr intact and symmetric Skin: Skin is warm. No rash, no LE edema Psychiatric: Pt behavior is normal. No agitation.     Assessment & Plan:

## 2014-06-23 NOTE — Progress Notes (Signed)
Pre visit review using our clinic review tool, if applicable. No additional management support is needed unless otherwise documented below in the visit note. 

## 2014-06-23 NOTE — Assessment & Plan Note (Signed)
stable overall by history and exam, recent data reviewed with pt, and pt to continue medical treatment as before,  to f/u any worsening symptoms or concerns Lab Results  Component Value Date   HGBA1C 7.4* 01/24/2014   For f/u labs as well

## 2014-06-23 NOTE — Assessment & Plan Note (Signed)
Suspect underlying flare of lumbar djd or ddd, I think can hold on films for now, but for pain control, flexeril prn, and predpac asd

## 2014-08-07 ENCOUNTER — Other Ambulatory Visit: Payer: Self-pay

## 2014-10-17 ENCOUNTER — Encounter: Payer: Self-pay | Admitting: Internal Medicine

## 2014-10-17 ENCOUNTER — Ambulatory Visit (INDEPENDENT_AMBULATORY_CARE_PROVIDER_SITE_OTHER): Payer: 59 | Admitting: Internal Medicine

## 2014-10-17 VITALS — BP 142/88 | HR 92 | Temp 98.0°F | Ht 69.0 in | Wt 240.0 lb

## 2014-10-17 DIAGNOSIS — E119 Type 2 diabetes mellitus without complications: Secondary | ICD-10-CM

## 2014-10-17 DIAGNOSIS — M545 Low back pain, unspecified: Secondary | ICD-10-CM

## 2014-10-17 DIAGNOSIS — R1030 Lower abdominal pain, unspecified: Secondary | ICD-10-CM

## 2014-10-17 DIAGNOSIS — G4700 Insomnia, unspecified: Secondary | ICD-10-CM | POA: Diagnosis not present

## 2014-10-17 DIAGNOSIS — R1032 Left lower quadrant pain: Secondary | ICD-10-CM

## 2014-10-17 MED ORDER — TRAMADOL HCL 50 MG PO TABS: 50.0000 mg | ORAL_TABLET | Freq: Four times a day (QID) | ORAL | Status: DC | PRN

## 2014-10-17 MED ORDER — ZOLPIDEM TARTRATE 10 MG PO TABS: 10.0000 mg | ORAL_TABLET | Freq: Every evening | ORAL | Status: DC | PRN

## 2014-10-17 MED ORDER — CYCLOBENZAPRINE HCL 5 MG PO TABS: 5.0000 mg | ORAL_TABLET | Freq: Three times a day (TID) | ORAL | Status: DC | PRN

## 2014-10-17 NOTE — Assessment & Plan Note (Signed)
Mild to mod, c/w msk strain vs underlying DJD/DDD flare, for tramadol prn, flexeril prn trial, and finish prednisone as prescribed; no new neuro change today, would hold on imaging for now

## 2014-10-17 NOTE — Progress Notes (Signed)
Subjective:    Patient ID: Troy Parsons, male    DOB: [birth year], 54 y.o.   MRN: [id number]  HPI  Here with acute on chronic back pain, sees chiropracter monthly, now with mod to severe, constant x 3 days, lower back , left much more than right with radiation to the left buttock, but not to the more distal leg; no numbness, Pt without  bowel or bladder change, fever, wt loss,  worsening LE pain/numbness/weakness, gait change or falls. Overall worse to sitting or lying down, Standing makes worse , though better today, walking no worse pain. Has a friend PA with 20 mg tid prednisone x 7 days, now some improved.  Has had left inguinal discomfort off and on, not really related to current symptoms, and left testical pains, but no swelling, recurrent hernia (s/p repair with mesh), and Denies urinary symptoms such as dysuria, frequency, urgency, flank pain, hematuria or n/v, fever, chills.  Quite difficult to sleep in last few days as well, has done well with ambien in the past Past Medical History  Diagnosis Date  . Depression   . Gout   . Hyperlipemia   . Type II or unspecified type diabetes mellitus without mention of complication, not stated as uncontrolled   . Esophageal reflux   . Abnormal liver function   . Allergy   . Hemorrhoids   . Hiatal hernia   . Fatty liver    Past Surgical History  Procedure Laterality Date  . Appendectomy    . Nasal septum surgery  2010  . Umbilical hernia repair  2009    reports that he has never smoked. He has never used smokeless tobacco. He reports that he drinks alcohol. He reports that he does not use illicit drugs. family history includes Cancer in his other; Colon cancer in an other family member; Diabetes in his mother and paternal grandfather; Heart disease in his other; Hyperlipidemia in his other; Hypertension in his other; Stroke in his other. Allergies  Allergen Reactions  . Lipitor [Atorvastatin]    Current Outpatient Prescriptions on File  Prior to Visit  Medication Sig Dispense Refill  . ACCU-CHEK FASTCLIX LANCETS MISC Use to help check blood sugars twice a day Dx E11.9 102 each 3  . allopurinol (ZYLOPRIM) 300 MG tablet Take 1 tablet (300 mg total) by mouth daily. 90 tablet 3  . aspirin EC 81 MG tablet Take 1 tablet (81 mg total) by mouth daily. 90 tablet 11  . Blood Glucose Monitoring Suppl (ACCU-CHEK AVIVA PLUS) W/DEVICE KIT Use to check blood sugars twice a day Dx E11.9 1 kit 0  . glucose blood test strip 1 each by Other route 2 (two) times daily. Use to check blood sugars twice a day Dx E11.9 100 each 3  . ibuprofen (ADVIL,MOTRIN) 800 MG tablet Take 1 tablet (800 mg total) by mouth 2 (two) times daily as needed. 60 tablet 5  . metFORMIN (GLUCOPHAGE-XR) 500 MG 24 hr tablet Take 1 tablet (500 mg total) by mouth daily with breakfast. 90 tablet 3  . omeprazole (PRILOSEC) 20 MG capsule Take 1 capsule (20 mg total) by mouth 2 (two) times daily. 180 capsule 3  . predniSONE (DELTASONE) 10 MG tablet 3 tabs by mouth per day for 3 days,2tabs per day for 3 days,1tab per day for 3 days (Patient taking differently: Take 20 mg by mouth 3 (three) times daily. 3 tabs by mouth per day for 3 days,2tabs per day for 3 days,1tab per day  for 3 days) 18 tablet 0  . simvastatin (ZOCOR) 40 MG tablet Take 1 tablet (40 mg total) by mouth at bedtime. 90 tablet 3  . traMADol (ULTRAM) 50 MG tablet Take 1 tablet (50 mg total) by mouth 4 (four) times daily as needed. 60 tablet 1  . cyclobenzaprine (FLEXERIL) 5 MG tablet Take 1 tablet (5 mg total) by mouth 3 (three) times daily as needed for muscle spasms. (Patient not taking: Reported on 10/17/2014) 40 tablet 1   No current facility-administered medications on file prior to visit.   Review of Systems  Constitutional: Negative for unusual diaphoresis or night sweats HENT: Negative for ringing in ear or discharge Eyes: Negative for double vision or worsening visual disturbance.  Respiratory: Negative for  choking and stridor.   Gastrointestinal: Negative for vomiting or other signifcant bowel change Genitourinary: Negative for hematuria or change in urine volume.  Musculoskeletal: Negative for other MSK pain or swelling Skin: Negative for color change and worsening wound.  Neurological: Negative for tremors and numbness other than noted  Psychiatric/Behavioral: Negative for decreased concentration or agitation other than above       Objective:   Physical Exam BP 142/88 mmHg  Pulse 92  Temp(Src) 98 F (36.7 C) (Oral)  Ht 5' 9" (1.753 m)  Wt 240 lb (108.863 kg)  BMI 35.43 kg/m2  SpO2 96% VS noted,  Constitutional: Pt appears in no significant distress HENT: Head: NCAT.  Right Ear: External ear normal.  Left Ear: External ear normal.  Eyes: . Pupils are equal, round, and reactive to light. Conjunctivae and EOM are normal Neck: Normal range of motion. Neck supple.  Cardiovascular: Normal rate and regular rhythm.   Pulmonary/Chest: Effort normal and breath sounds without rales or wheezing.  Abd:  Soft, NT, ND, + BS Neurological: Pt is alert. Not confused , motor 5/5 intact, with intact DTR, sens, gait Skin: Skin is warm. No rash, no LE edema Psychiatric: Pt behavior is normal. No agitation.  Spine mild tender lowest lumbar midlne and left paravertebral with likely small spasm area, but no other swelling, red, rash     Assessment & Plan:

## 2014-10-17 NOTE — Assessment & Plan Note (Signed)
Mild to mod situational, ok for ambien prn

## 2014-10-17 NOTE — Progress Notes (Signed)
Pre visit review using our clinic review tool, if applicable. No additional management support is needed unless otherwise documented below in the visit note. 

## 2014-10-17 NOTE — Assessment & Plan Note (Signed)
Exam benign, not likely related to lumbar spine issue,  to f/u any worsening symptoms or concerns

## 2014-10-17 NOTE — Patient Instructions (Addendum)
Please take all new medication as prescribed  - the pain medication if needed, and the muscle relaxer  OK to finish the prednisone as you have been starting  Please continue all other medications as before, and refills have been done if requested - the ambien  Please have the pharmacy call with any other refills you may need.  Please continue your efforts at being more active, low cholesterol diet, and weight control.  Please keep your appointments with your specialists as you may have planned

## 2014-10-17 NOTE — Assessment & Plan Note (Signed)
stable overall by history and exam, recent data reviewed with pt, and pt to continue medical treatment as before,  to f/u any worsening symptoms or concerns Lab Results  Component Value Date   HGBA1C 7.0* 06/23/2014

## 2014-11-25 ENCOUNTER — Other Ambulatory Visit: Payer: Self-pay | Admitting: Internal Medicine

## 2014-12-26 ENCOUNTER — Other Ambulatory Visit: Payer: Self-pay | Admitting: Internal Medicine

## 2015-01-20 ENCOUNTER — Other Ambulatory Visit: Payer: Self-pay | Admitting: Internal Medicine

## 2015-01-26 ENCOUNTER — Encounter: Payer: 59 | Admitting: Internal Medicine

## 2015-02-28 LAB — HM DIABETES EYE EXAM

## 2015-03-05 ENCOUNTER — Encounter: Payer: Self-pay | Admitting: Internal Medicine

## 2015-03-16 ENCOUNTER — Other Ambulatory Visit: Payer: Self-pay | Admitting: Internal Medicine

## 2015-03-19 ENCOUNTER — Encounter: Payer: Self-pay | Admitting: Internal Medicine

## 2015-03-24 ENCOUNTER — Other Ambulatory Visit: Payer: Self-pay | Admitting: Internal Medicine

## 2015-03-30 ENCOUNTER — Encounter: Payer: Self-pay | Admitting: Internal Medicine

## 2015-03-30 ENCOUNTER — Other Ambulatory Visit (INDEPENDENT_AMBULATORY_CARE_PROVIDER_SITE_OTHER): Payer: 59

## 2015-03-30 ENCOUNTER — Ambulatory Visit (INDEPENDENT_AMBULATORY_CARE_PROVIDER_SITE_OTHER): Payer: 59 | Admitting: Internal Medicine

## 2015-03-30 VITALS — BP 132/86 | HR 63 | Temp 97.9°F | Resp 20 | Wt 236.0 lb

## 2015-03-30 DIAGNOSIS — Z0000 Encounter for general adult medical examination without abnormal findings: Secondary | ICD-10-CM | POA: Diagnosis not present

## 2015-03-30 DIAGNOSIS — Z23 Encounter for immunization: Secondary | ICD-10-CM | POA: Diagnosis not present

## 2015-03-30 LAB — CBC WITH DIFFERENTIAL/PLATELET
Basophils Absolute: 0 10*3/uL (ref 0.0–0.1)
Basophils Relative: 0.7 % (ref 0.0–3.0)
EOS PCT: 2.2 % (ref 0.0–5.0)
Eosinophils Absolute: 0.1 10*3/uL (ref 0.0–0.7)
HCT: 46.8 % (ref 39.0–52.0)
Hemoglobin: 15.9 g/dL (ref 13.0–17.0)
LYMPHS ABS: 1.9 10*3/uL (ref 0.7–4.0)
Lymphocytes Relative: 35.9 % (ref 12.0–46.0)
MCHC: 34 g/dL (ref 30.0–36.0)
MCV: 88.1 fl (ref 78.0–100.0)
MONOS PCT: 10.1 % (ref 3.0–12.0)
Monocytes Absolute: 0.5 10*3/uL (ref 0.1–1.0)
NEUTROS ABS: 2.6 10*3/uL (ref 1.4–7.7)
NEUTROS PCT: 51.1 % (ref 43.0–77.0)
Platelets: 214 10*3/uL (ref 150.0–400.0)
RBC: 5.31 Mil/uL (ref 4.22–5.81)
RDW: 12.9 % (ref 11.5–15.5)
WBC: 5.2 10*3/uL (ref 4.0–10.5)

## 2015-03-30 LAB — URINALYSIS, ROUTINE W REFLEX MICROSCOPIC
BILIRUBIN URINE: NEGATIVE
HGB URINE DIPSTICK: NEGATIVE
Ketones, ur: NEGATIVE
LEUKOCYTES UA: NEGATIVE
NITRITE: NEGATIVE
PH: 6 (ref 5.0–8.0)
RBC / HPF: NONE SEEN (ref 0–?)
Specific Gravity, Urine: 1.02 (ref 1.000–1.030)
Urine Glucose: NEGATIVE
Urobilinogen, UA: 0.2 (ref 0.0–1.0)

## 2015-03-30 LAB — BASIC METABOLIC PANEL
BUN: 17 mg/dL (ref 6–23)
CALCIUM: 9.5 mg/dL (ref 8.4–10.5)
CO2: 28 mEq/L (ref 19–32)
Chloride: 101 mEq/L (ref 96–112)
Creatinine, Ser: 0.79 mg/dL (ref 0.40–1.50)
GFR: 109.2 mL/min (ref >60.00)
Glucose, Bld: 220 mg/dL — ABNORMAL HIGH (ref 70–99)
Potassium: 4.6 mEq/L (ref 3.5–5.1)
SODIUM: 138 meq/L (ref 135–145)

## 2015-03-30 LAB — HEPATIC FUNCTION PANEL
ALBUMIN: 4.5 g/dL (ref 3.5–5.2)
ALK PHOS: 61 U/L (ref 39–117)
ALT: 146 U/L — ABNORMAL HIGH (ref 0–53)
AST: 96 U/L — ABNORMAL HIGH (ref 0–37)
BILIRUBIN DIRECT: 0.2 mg/dL (ref 0.0–0.3)
BILIRUBIN TOTAL: 0.8 mg/dL (ref 0.2–1.2)
Total Protein: 6.6 g/dL (ref 6.0–8.3)

## 2015-03-30 LAB — LIPID PANEL
CHOL/HDL RATIO: 4
Cholesterol: 155 mg/dL (ref 0–200)
HDL: 41.4 mg/dL (ref >39.00)
LDL CALC: 78 mg/dL (ref 0–99)
NONHDL: 114.05
Triglycerides: 180 mg/dL — ABNORMAL HIGH (ref 0.0–149.0)
VLDL: 36 mg/dL (ref 0.0–40.0)

## 2015-03-30 LAB — TSH: TSH: 2.75 u[IU]/mL (ref 0.35–4.50)

## 2015-03-30 LAB — PSA: PSA: 0.81 ng/mL (ref 0.10–4.00)

## 2015-03-30 NOTE — Progress Notes (Signed)
Subjective:    Patient ID: Troy Parsons, male    DOB: Aug 08, 1960, 55 y.o.   MRN: [id number]  HPI  Here for wellness and f/u;  Overall doing ok;  Pt denies Chest pain, worsening SOB, DOE, wheezing, orthopnea, PND, worsening LE edema, palpitations, dizziness or syncope.  Pt denies neurological change such as new headache, facial or extremity weakness.  Pt denies polydipsia, polyuria, or low sugar symptoms. Pt states overall good compliance with treatment and medications, good tolerability, and has been trying to follow appropriate diet.  Pt denies worsening depressive symptoms, suicidal ideation or panic. No fever, night sweats, wt loss, loss of appetite, or other constitutional symptoms.  Pt states good ability with ADL's, has low fall risk, home safety reviewed and adequate, no other significant changes in hearing or vision, and only occasionally active with exercise. Does yoga fairly often, Due for Prevnar 13. No other complaints Past Medical History  Diagnosis Date  . Depression   . Gout   . Hyperlipemia   . Type II or unspecified type diabetes mellitus without mention of complication, not stated as uncontrolled   . Esophageal reflux   . Abnormal liver function   . Allergy   . Hemorrhoids   . Hiatal hernia   . Fatty liver    Past Surgical History  Procedure Laterality Date  . Appendectomy    . Nasal septum surgery  2010  . Umbilical hernia repair  2009    reports that he has never smoked. He has never used smokeless tobacco. He reports that he drinks alcohol. He reports that he does not use illicit drugs. family history includes Cancer in his other; Diabetes in his mother and paternal grandfather; Heart disease in his other; Hyperlipidemia in his other; Hypertension in his other; Stroke in his other. Allergies  Allergen Reactions  . Lipitor [Atorvastatin]    Current Outpatient Prescriptions on File Prior to Visit  Medication Sig Dispense Refill  . ACCU-CHEK FASTCLIX LANCETS  MISC Use to help check blood sugars twice a day Dx E11.9 102 each 3  . allopurinol (ZYLOPRIM) 300 MG tablet Take 1 tablet (300 mg total) by mouth daily. 90 tablet 3  . aspirin EC 81 MG tablet Take 1 tablet (81 mg total) by mouth daily. 90 tablet 11  . Blood Glucose Monitoring Suppl (ACCU-CHEK AVIVA PLUS) W/DEVICE KIT Use to check blood sugars twice a day Dx E11.9 1 kit 0  . cyclobenzaprine (FLEXERIL) 5 MG tablet Take 1 tablet (5 mg total) by mouth 3 (three) times daily as needed for muscle spasms. 40 tablet 1  . glucose blood test strip 1 each by Other route 2 (two) times daily. Use to check blood sugars twice a day Dx E11.9 100 each 3  . ibuprofen (ADVIL,MOTRIN) 800 MG tablet TAKE 1 TABLET BY MOUTH TWICE DAILY AS NEEDED 60 tablet 2  . metFORMIN (GLUCOPHAGE-XR) 500 MG 24 hr tablet TAKE 1 TABLET BY MOUTH DAILY WITH BREAKFAST. 90 tablet 2  . omeprazole (PRILOSEC) 20 MG capsule TAKE 1 CAPSULE BY MOUTH TWICE DAILY 180 capsule 0  . predniSONE (DELTASONE) 10 MG tablet 3 tabs by mouth per day for 3 days,2tabs per day for 3 days,1tab per day for 3 days (Patient taking differently: Take 20 mg by mouth 3 (three) times daily. 3 tabs by mouth per day for 3 days,2tabs per day for 3 days,1tab per day for 3 days) 18 tablet 0  . simvastatin (ZOCOR) 40 MG tablet TAKE 1 TABLET BY  MOUTH EVERY NIGHT AT BEDTIME 90 tablet 2   No current facility-administered medications on file prior to visit.   Review of Systems Constitutional: Negative for increased diaphoresis, other activity, appetite or siginficant weight change other than noted HENT: Negative for worsening hearing loss, ear pain, facial swelling, mouth sores and neck stiffness.   Eyes: Negative for other worsening pain, redness or visual disturbance.  Respiratory: Negative for shortness of breath and wheezing  Cardiovascular: Negative for chest pain and palpitations.  Gastrointestinal: Negative for diarrhea, blood in stool, abdominal distention or other  pain Genitourinary: Negative for hematuria, flank pain or change in urine volume.  Musculoskeletal: Negative for myalgias or other joint complaints.  Skin: Negative for color change and wound or drainage.  Neurological: Negative for syncope and numbness. other than noted Hematological: Negative for adenopathy. or other swelling Psychiatric/Behavioral: Negative for hallucinations, SI, self-injury, decreased concentration or other worsening agitation.      Objective:   Physical Exam BP 132/86 mmHg  Pulse 63  Temp(Src) 97.9 F (36.6 C) (Oral)  Resp 20  Wt 236 lb (107.049 kg)  SpO2 94% VS noted, obese Constitutional: Pt is oriented to person, place, and time. Appears well-developed and well-nourished, in no significant distress Head: Normocephalic and atraumatic.  Right Ear: External ear normal.  Left Ear: External ear normal.  Nose: Nose normal.  Mouth/Throat: Oropharynx is clear and moist.  Eyes: Conjunctivae and EOM are normal. Pupils are equal, round, and reactive to light.  Neck: Normal range of motion. Neck supple. No JVD present. No tracheal deviation present or significant neck LA or mass Cardiovascular: Normal rate, regular rhythm, normal heart sounds and intact distal pulses.   Pulmonary/Chest: Effort normal and breath sounds without rales or wheezing  Abdominal: Soft. Bowel sounds are normal. NT. No HSM  Musculoskeletal: Normal range of motion. Exhibits no edema.  Lymphadenopathy:  Has no cervical adenopathy.  Neurological: Pt is alert and oriented to person, place, and time. Pt has normal reflexes. No cranial nerve deficit. Motor grossly intact Skin: Skin is warm and dry. No rash noted.  Psychiatric:  Has normal mood and affect. Behavior is normal.     Assessment & Plan:

## 2015-03-30 NOTE — Addendum Note (Signed)
Addended by: Della Goo C on: 03/30/2015 09:17 AM   Modules accepted: Orders

## 2015-03-30 NOTE — Patient Instructions (Addendum)
You had the Prevnar 13 pneumonia shot today  Your EKG was OK today  Please continue all other medications as before, and refills have been done if requested.  Please have the pharmacy call with any other refills you may need.  Please continue your efforts at being more active, low cholesterol diet, and weight control.  You are otherwise up to date with prevention measures today.  Please keep your appointments with your specialists as you may have planned

## 2015-03-30 NOTE — Progress Notes (Signed)
Pre visit review using our clinic review tool, if applicable. No additional management support is needed unless otherwise documented below in the visit note. 

## 2015-03-30 NOTE — Assessment & Plan Note (Signed)

## 2015-04-18 ENCOUNTER — Other Ambulatory Visit: Payer: Self-pay | Admitting: Internal Medicine

## 2015-06-12 ENCOUNTER — Other Ambulatory Visit: Payer: Self-pay | Admitting: Internal Medicine

## 2015-06-22 ENCOUNTER — Other Ambulatory Visit: Payer: Self-pay | Admitting: Internal Medicine

## 2015-07-14 ENCOUNTER — Other Ambulatory Visit: Payer: Self-pay | Admitting: Internal Medicine

## 2015-09-10 ENCOUNTER — Other Ambulatory Visit: Payer: Self-pay | Admitting: Internal Medicine

## 2015-10-01 ENCOUNTER — Other Ambulatory Visit: Payer: Self-pay | Admitting: Internal Medicine

## 2015-10-18 ENCOUNTER — Other Ambulatory Visit: Payer: Self-pay | Admitting: Internal Medicine

## 2015-10-29 ENCOUNTER — Other Ambulatory Visit: Payer: Self-pay | Admitting: *Deleted

## 2015-10-29 MED ORDER — IBUPROFEN 800 MG PO TABS: 800.0000 mg | ORAL_TABLET | Freq: Two times a day (BID) | ORAL | 0 refills | Status: DC | PRN

## 2015-11-07 ENCOUNTER — Ambulatory Visit (INDEPENDENT_AMBULATORY_CARE_PROVIDER_SITE_OTHER): Payer: 59 | Admitting: Internal Medicine

## 2015-11-07 ENCOUNTER — Encounter: Payer: Self-pay | Admitting: Internal Medicine

## 2015-11-07 VITALS — BP 134/78 | HR 76 | Temp 98.9°F | Resp 20 | Wt 230.0 lb

## 2015-11-07 DIAGNOSIS — H6981 Other specified disorders of Eustachian tube, right ear: Secondary | ICD-10-CM

## 2015-11-07 DIAGNOSIS — H6691 Otitis media, unspecified, right ear: Secondary | ICD-10-CM | POA: Diagnosis not present

## 2015-11-07 DIAGNOSIS — J309 Allergic rhinitis, unspecified: Secondary | ICD-10-CM | POA: Diagnosis not present

## 2015-11-07 DIAGNOSIS — H6980 Other specified disorders of Eustachian tube, unspecified ear: Secondary | ICD-10-CM | POA: Insufficient documentation

## 2015-11-07 MED ORDER — METHYLPREDNISOLONE ACETATE 80 MG/ML IJ SUSP
80.0000 mg | Freq: Once | INTRAMUSCULAR | Status: AC
  Administered 2015-11-07: 80 mg via INTRAMUSCULAR

## 2015-11-07 NOTE — Progress Notes (Signed)
Pre visit review using our clinic review tool, if applicable. No additional management support is needed unless otherwise documented below in the visit note. 

## 2015-11-07 NOTE — Progress Notes (Signed)
Subjective:    Patient ID: Troy Parsons, male    DOB: [birth year], 55 y.o.   MRN: [id number]  HPI   Here with 2-3 days acute onset fever, left ear pain, pressure, headache, general weakness and malaise, and greenish d/c, with mild ST and cough, but pt denies chest pain, wheezing, increased sob or doe, orthopnea, PND, increased LE swelling, palpitations, dizziness or syncope. Has some crackling and popping right ear as well without veritgo or hearing loss.  Does have several wks ongoing nasal allergy symptoms with clearish congestion, itch and sneezing, without fever, pain, ST, cough, swelling or wheezing. Past Medical History:  Diagnosis Date  . Abnormal liver function   . Allergy   . Depression   . Esophageal reflux   . Fatty liver   . Gout   . Hemorrhoids   . Hiatal hernia   . Hyperlipemia   . Type II or unspecified type diabetes mellitus without mention of complication, not stated as uncontrolled    Past Surgical History:  Procedure Laterality Date  . APPENDECTOMY    . NASAL SEPTUM SURGERY  2010  . UMBILICAL HERNIA REPAIR  2009    reports that he has never smoked. He has never used smokeless tobacco. He reports that he drinks alcohol. He reports that he does not use drugs. family history includes Cancer in his other; Diabetes in his mother and paternal grandfather; Heart disease in his other; Hyperlipidemia in his other; Hypertension in his other; Stroke in his other. Allergies  Allergen Reactions  . Lipitor [Atorvastatin]    Current Outpatient Prescriptions on File Prior to Visit  Medication Sig Dispense Refill  . ACCU-CHEK FASTCLIX LANCETS MISC Use to help check blood sugars twice a day Dx E11.9 102 each 3  . allopurinol (ZYLOPRIM) 300 MG tablet Take 1 tablet (300 mg total) by mouth daily. 90 tablet 3  . allopurinol (ZYLOPRIM) 300 MG tablet TAKE 1 TABLET BY MOUTH EVERY DAY 90 tablet 0  . allopurinol (ZYLOPRIM) 300 MG tablet TAKE 1 TABLET BY MOUTH EVERY DAY 90 tablet 0  .  allopurinol (ZYLOPRIM) 300 MG tablet TAKE 1 TABLET BY MOUTH EVERY DAY 90 tablet 1  . aspirin EC 81 MG tablet Take 1 tablet (81 mg total) by mouth daily. 90 tablet 11  . Blood Glucose Monitoring Suppl (ACCU-CHEK AVIVA PLUS) W/DEVICE KIT Use to check blood sugars twice a day Dx E11.9 1 kit 0  . cyclobenzaprine (FLEXERIL) 5 MG tablet Take 1 tablet (5 mg total) by mouth 3 (three) times daily as needed for muscle spasms. 40 tablet 1  . glucose blood test strip 1 each by Other route 2 (two) times daily. Use to check blood sugars twice a day Dx E11.9 100 each 3  . ibuprofen (ADVIL,MOTRIN) 800 MG tablet Take 1 tablet (800 mg total) by mouth 2 (two) times daily as needed. 60 tablet 0  . metFORMIN (GLUCOPHAGE-XR) 500 MG 24 hr tablet TAKE 1 TABLET BY MOUTH DAILY WITH BREAKFAST. 90 tablet 0  . omeprazole (PRILOSEC) 20 MG capsule TAKE 1 CAPSULE BY MOUTH TWICE DAILY 180 capsule 0  . omeprazole (PRILOSEC) 20 MG capsule TAKE 1 CAPSULE BY MOUTH TWICE DAILY 180 capsule 0  . simvastatin (ZOCOR) 40 MG tablet TAKE 1 TABLET BY MOUTH EVERY NIGHT AT BEDTIME 90 tablet 0   No current facility-administered medications on file prior to visit.    Review of Systems  Constitutional: Negative for unusual diaphoresis or night sweats HENT: Negative for ear swelling  or discharge Eyes: Negative for worsening visual haziness  Respiratory: Negative for choking and stridor.   Gastrointestinal: Negative for distension or worsening eructation Genitourinary: Negative for retention or change in urine volume.  Musculoskeletal: Negative for other MSK pain or swelling Skin: Negative for color change and worsening wound Neurological: Negative for tremors and numbness other than noted  Psychiatric/Behavioral: Negative for decreased concentration or agitation other than above       Objective:   Physical Exam BP 134/78   Pulse 76   Temp 98.9 F (37.2 C) (Oral)   Resp 20   Wt 230 lb (104.3 kg)   SpO2 96%   BMI 33.97 kg/m  VS  noted, mild ill Constitutional: Pt appears in no apparent distress HENT: Head: NCAT.  Right Ear: External ear normal.  Left Ear: External ear normal.  Eyes: . Pupils are equal, round, and reactive to light. Conjunctivae and EOM are normal Right tm's with severe erythema, mild bulging.  Max sinus areas non tender.  Pharynx with mild erythema, no exudate Neck: Normal range of motion. Neck supple.  Cardiovascular: Normal rate and regular rhythm.   Pulmonary/Chest: Effort normal and breath sounds without rales or wheezing.  Neurological: Pt is alert. Not confused , motor grossly intact Skin: Skin is warm. No rash, no LE edema Psychiatric: Pt behavior is normal. No agitation.      Assessment & Plan:

## 2015-11-07 NOTE — Patient Instructions (Signed)
You had the steroid shot today  Please take all new medication as prescribed - the OTC Muxinex, sudafed, as well as Zyrtec and Nasacort until improved  Please continue all other medications as before, including the augmentin antibiotic  Please have the pharmacy call with any other refills you may need.  Please keep your appointments with your specialists as you may have planned

## 2015-11-09 DIAGNOSIS — K219 Gastro-esophageal reflux disease without esophagitis: Secondary | ICD-10-CM | POA: Insufficient documentation

## 2015-11-09 DIAGNOSIS — H9121 Sudden idiopathic hearing loss, right ear: Secondary | ICD-10-CM | POA: Insufficient documentation

## 2015-11-09 NOTE — Assessment & Plan Note (Signed)
Mild to mod, for otc zyrtec and nasacoirt asd,  to f/u any worsening symptoms or concerns

## 2015-11-09 NOTE — Assessment & Plan Note (Signed)
Mild to mod, right ear, for mucinex otc prn,  to f/u any worsening symptoms or concerns

## 2015-11-09 NOTE — Assessment & Plan Note (Signed)
Mild to mod, for antibx course,  to f/u any worsening symptoms or concerns 

## 2015-11-26 ENCOUNTER — Other Ambulatory Visit: Payer: Self-pay | Admitting: Internal Medicine

## 2015-12-10 ENCOUNTER — Other Ambulatory Visit: Payer: Self-pay | Admitting: Internal Medicine

## 2016-01-17 ENCOUNTER — Telehealth: Payer: Self-pay

## 2016-01-17 ENCOUNTER — Ambulatory Visit (INDEPENDENT_AMBULATORY_CARE_PROVIDER_SITE_OTHER): Payer: 59 | Admitting: Internal Medicine

## 2016-01-17 ENCOUNTER — Encounter: Payer: Self-pay | Admitting: Internal Medicine

## 2016-01-17 VITALS — BP 160/80 | HR 78 | Temp 98.2°F | Resp 20 | Wt 224.0 lb

## 2016-01-17 DIAGNOSIS — E119 Type 2 diabetes mellitus without complications: Secondary | ICD-10-CM | POA: Diagnosis not present

## 2016-01-17 DIAGNOSIS — M545 Low back pain, unspecified: Secondary | ICD-10-CM

## 2016-01-17 DIAGNOSIS — I10 Essential (primary) hypertension: Secondary | ICD-10-CM | POA: Diagnosis not present

## 2016-01-17 DIAGNOSIS — E785 Hyperlipidemia, unspecified: Secondary | ICD-10-CM | POA: Diagnosis not present

## 2016-01-17 MED ORDER — ALLOPURINOL 300 MG PO TABS: 300.0000 mg | ORAL_TABLET | Freq: Every day | ORAL | 3 refills | Status: DC

## 2016-01-17 MED ORDER — CYCLOBENZAPRINE HCL 5 MG PO TABS: 5.0000 mg | ORAL_TABLET | Freq: Three times a day (TID) | ORAL | 1 refills | Status: DC | PRN

## 2016-01-17 MED ORDER — VALSARTAN 160 MG PO TABS: 160.0000 mg | ORAL_TABLET | Freq: Every day | ORAL | 3 refills | Status: DC

## 2016-01-17 MED ORDER — METFORMIN HCL ER 500 MG PO TB24: 500.0000 mg | ORAL_TABLET | Freq: Every day | ORAL | 3 refills | Status: DC

## 2016-01-17 MED ORDER — METFORMIN HCL ER 500 MG PO TB24: ORAL_TABLET | ORAL | 3 refills | Status: DC

## 2016-01-17 MED ORDER — GLIPIZIDE ER 5 MG PO TB24: 5.0000 mg | ORAL_TABLET | Freq: Every day | ORAL | 3 refills | Status: DC

## 2016-01-17 NOTE — Addendum Note (Signed)
Addended by: Biagio Borg on: 01/17/2016 04:12 PM   Modules accepted: Orders

## 2016-01-17 NOTE — Progress Notes (Signed)
Subjective:    Patient ID: Troy Parsons, male    DOB: 1960/09/17, 55 y.o.   MRN: [id number]  HPI   Here to f/u; overall doing ok,  Pt denies chest pain, increasing sob or doe, wheezing, orthopnea, PND, increased LE swelling, palpitations, dizziness or syncope.  Pt denies new neurological symptoms such as new headache, or facial or extremity weakness or numbness.  Pt denies polydipsia, polyuria, or low sugar episode.   Pt denies new neurological symptoms such as new headache, or facial or extremity weakness or numbness.   Pt states overall good compliance with meds, mostly trying to follow appropriate diet, with wt overall stable,  but little exercise however due to pain.  Pt continues to have recurring left LBP for 1 wk after hanging the  Xmas lights on the house without change in severity, bowel or bladder change, fever, wt loss,  worsening LE pain/numbness/weakness, gait change or falls.  S/p ENT x 5 for right ear issue. BP Readings from Last 3 Encounters:  01/17/16 (!) 160/80  11/07/15 134/78  03/30/15 132/86  Lost 12 lbs with more actiivity, though diet about the same to somewhat better.   Wt Readings from Last 3 Encounters:  01/17/16 224 lb (101.6 kg)  11/07/15 230 lb (104.3 kg)  03/30/15 236 lb (107 kg)  On sabbattical from work;if anything more active when not working with exercise per pt  BP s in the 140's to 160 at home.   Past Medical History:  Diagnosis Date  . Abnormal liver function   . Allergy   . Depression   . Esophageal reflux   . Fatty liver   . Gout   . Hemorrhoids   . Hiatal hernia   . Hyperlipemia   . Type II or unspecified type diabetes mellitus without mention of complication, not stated as uncontrolled    Past Surgical History:  Procedure Laterality Date  . APPENDECTOMY    . NASAL SEPTUM SURGERY  2010  . UMBILICAL HERNIA REPAIR  2009    reports that he has never smoked. He has never used smokeless tobacco. He reports that he drinks alcohol. He reports  that he does not use drugs. family history includes Cancer in his other; Diabetes in his mother and paternal grandfather; Heart disease in his other; Hyperlipidemia in his other; Hypertension in his other; Stroke in his other. Allergies  Allergen Reactions  . Lipitor [Atorvastatin]    Current Outpatient Prescriptions on File Prior to Visit  Medication Sig Dispense Refill  . ACCU-CHEK FASTCLIX LANCETS MISC Use to help check blood sugars twice a day Dx E11.9 102 each 3  . aspirin EC 81 MG tablet Take 1 tablet (81 mg total) by mouth daily. 90 tablet 11  . Blood Glucose Monitoring Suppl (ACCU-CHEK AVIVA PLUS) W/DEVICE KIT Use to check blood sugars twice a day Dx E11.9 1 kit 0  . glucose blood test strip 1 each by Other route 2 (two) times daily. Use to check blood sugars twice a day Dx E11.9 100 each 3  . ibuprofen (ADVIL,MOTRIN) 800 MG tablet TAKE 1 TABLET(800 MG) BY MOUTH TWICE DAILY AS NEEDED 60 tablet 0  . omeprazole (PRILOSEC) 20 MG capsule TAKE 1 CAPSULE BY MOUTH TWICE DAILY 180 capsule 1  . simvastatin (ZOCOR) 40 MG tablet TAKE 1 TABLET BY MOUTH EVERY NIGHT AT BEDTIME 90 tablet 0   No current facility-administered medications on file prior to visit.    Review of Systems  Constitutional: Negative for  unusual diaphoresis or night sweats HENT: Negative for ear swelling or discharge Eyes: Negative for worsening visual haziness  Respiratory: Negative for choking and stridor.   Gastrointestinal: Negative for distension or worsening eructation Genitourinary: Negative for retention or change in urine volume.  Musculoskeletal: Negative for other MSK pain or swelling Skin: Negative for color change and worsening wound Neurological: Negative for tremors and numbness other than noted  Psychiatric/Behavioral: Negative for decreased concentration or agitation other than above   All other system neg per pt    Objective:   Physical Exam BP (!) 160/80   Pulse 78   Temp 98.2 F (36.8 C)  (Oral)   Resp 20   Wt 224 lb (101.6 kg)   SpO2 96%   BMI 33.08 kg/m  VS noted,  Constitutional: Pt appears in no apparent distress HENT: Head: NCAT.  Right Ear: External ear normal.  Left Ear: External ear normal.  Eyes: . Pupils are equal, round, and reactive to light. Conjunctivae and EOM are normal Neck: Normal range of motion. Neck supple.  Cardiovascular: Normal rate and regular rhythm.   Pulmonary/Chest: Effort normal and breath sounds without rales or wheezing.  Spine nontender; has left lumbar paravertebral spasm/tender without rash or swelling Neurological: Pt is alert. Not confused , motor grossly intact Skin: Skin is warm. No rash, no LE edema Psychiatric: Pt behavior is normal. No agitation.  No other new exam findings  Hgba1c (POCT) - 11.6     Assessment & Plan:

## 2016-01-17 NOTE — Progress Notes (Signed)
Pre visit review using our clinic review tool, if applicable. No additional management support is needed unless otherwise documented below in the visit note. 

## 2016-01-17 NOTE — Telephone Encounter (Signed)
Done erx 

## 2016-01-17 NOTE — Telephone Encounter (Signed)
Please advise patients glipizide was not called into pharmacy

## 2016-01-17 NOTE — Patient Instructions (Addendum)
Your A1c test today is:  11.6  Please increase the metformin ER 500 mg to 2 per day  Please take all new medication as prescribed - the glipizide ER 5 mg per day  Please check your sugars at least twice per day, and call in 2 weeks if the sugars are still mostly over 200  Please take all new medication as prescribed - the diovan 160 mg per day  Please continue all other medications as before, the flexeril  Please have the pharmacy call with any other refills you may need.  Please continue your efforts at being more active, low cholesterol diet, and weight control.  Please keep your appointments with your specialists as you may have planned  Please follow as planned for Feb 2018

## 2016-01-19 NOTE — Assessment & Plan Note (Signed)
stable overall by history and exam, recent data reviewed with pt, and pt to continue medical treatment as before,  to f/u any worsening symptoms or concerns Lab Results  Component Value Date   LDLCALC 78 03/30/2015

## 2016-01-19 NOTE — Assessment & Plan Note (Signed)
Overall very poor control, I suspect likely related to less activity on sabattical from work and/or diet changes, now with polys and recent wt loss, for increased metformin er 500 x 2 qd, add glipizide XR  5 qd

## 2016-01-19 NOTE — Assessment & Plan Note (Signed)
C/w msk strain, for flexeril prn,  to f/u any worsening symptoms or concerns 

## 2016-01-19 NOTE — Assessment & Plan Note (Signed)
Uncontrolled, likely due to lifestyle change as well I suspect, for add diovan 160 qd, cont to monitor at home,  to f/u any worsening symptoms or concerns

## 2016-02-10 ENCOUNTER — Other Ambulatory Visit: Payer: Self-pay | Admitting: Internal Medicine

## 2016-02-12 ENCOUNTER — Other Ambulatory Visit: Payer: Self-pay | Admitting: Internal Medicine

## 2016-02-13 MED ORDER — ACCU-CHEK FASTCLIX LANCETS MISC: 3 refills | Status: DC

## 2016-02-13 MED ORDER — ACCU-CHEK AVIVA PLUS W/DEVICE KIT: PACK | 0 refills | Status: DC

## 2016-02-13 NOTE — Telephone Encounter (Signed)
Medication refills sent to pharmacy 

## 2016-03-06 ENCOUNTER — Other Ambulatory Visit: Payer: Self-pay | Admitting: Internal Medicine

## 2016-04-04 ENCOUNTER — Encounter: Payer: 59 | Admitting: Internal Medicine

## 2016-04-14 ENCOUNTER — Other Ambulatory Visit (INDEPENDENT_AMBULATORY_CARE_PROVIDER_SITE_OTHER): Payer: 59

## 2016-04-14 DIAGNOSIS — Z0000 Encounter for general adult medical examination without abnormal findings: Secondary | ICD-10-CM | POA: Diagnosis not present

## 2016-04-14 LAB — CBC WITH DIFFERENTIAL/PLATELET
BASOS PCT: 0.6 % (ref 0.0–3.0)
Basophils Absolute: 0 10*3/uL (ref 0.0–0.1)
EOS PCT: 1.5 % (ref 0.0–5.0)
Eosinophils Absolute: 0.1 10*3/uL (ref 0.0–0.7)
HCT: 45.7 % (ref 39.0–52.0)
HEMOGLOBIN: 15.6 g/dL (ref 13.0–17.0)
LYMPHS ABS: 2.1 10*3/uL (ref 0.7–4.0)
Lymphocytes Relative: 38.6 % (ref 12.0–46.0)
MCHC: 34.2 g/dL (ref 30.0–36.0)
MCV: 91.3 fl (ref 78.0–100.0)
MONOS PCT: 11.3 % (ref 3.0–12.0)
Monocytes Absolute: 0.6 10*3/uL (ref 0.1–1.0)
Neutro Abs: 2.6 10*3/uL (ref 1.4–7.7)
Neutrophils Relative %: 48 % (ref 43.0–77.0)
Platelets: 211 10*3/uL (ref 150.0–400.0)
RBC: 5.01 Mil/uL (ref 4.22–5.81)
RDW: 13.1 % (ref 11.5–15.5)
WBC: 5.4 10*3/uL (ref 4.0–10.5)

## 2016-04-14 LAB — URINALYSIS, ROUTINE W REFLEX MICROSCOPIC
BILIRUBIN URINE: NEGATIVE
HGB URINE DIPSTICK: NEGATIVE
KETONES UR: NEGATIVE
LEUKOCYTES UA: NEGATIVE
NITRITE: NEGATIVE
Specific Gravity, Urine: 1.02 (ref 1.000–1.030)
Total Protein, Urine: NEGATIVE
Urine Glucose: NEGATIVE
Urobilinogen, UA: 0.2 (ref 0.0–1.0)
pH: 6 (ref 5.0–8.0)

## 2016-04-14 LAB — HEPATIC FUNCTION PANEL
ALBUMIN: 4.5 g/dL (ref 3.5–5.2)
ALK PHOS: 50 U/L (ref 39–117)
ALT: 62 U/L — ABNORMAL HIGH (ref 0–53)
AST: 36 U/L (ref 0–37)
BILIRUBIN TOTAL: 0.6 mg/dL (ref 0.2–1.2)
Bilirubin, Direct: 0.1 mg/dL (ref 0.0–0.3)
Total Protein: 6.7 g/dL (ref 6.0–8.3)

## 2016-04-14 LAB — BASIC METABOLIC PANEL
BUN: 17 mg/dL (ref 6–23)
CALCIUM: 9.5 mg/dL (ref 8.4–10.5)
CO2: 29 mEq/L (ref 19–32)
Chloride: 104 mEq/L (ref 96–112)
Creatinine, Ser: 0.86 mg/dL (ref 0.40–1.50)
GFR: 98.61 mL/min (ref >60.00)
Glucose, Bld: 134 mg/dL — ABNORMAL HIGH (ref 70–99)
POTASSIUM: 4.3 meq/L (ref 3.5–5.1)
SODIUM: 143 meq/L (ref 135–145)

## 2016-04-14 LAB — LIPID PANEL
CHOL/HDL RATIO: 4
CHOLESTEROL: 156 mg/dL (ref 0–200)
HDL: 42.3 mg/dL (ref >39.00)
LDL CALC: 80 mg/dL (ref 0–99)
NonHDL: 113.66
Triglycerides: 169 mg/dL — ABNORMAL HIGH (ref 0.0–149.0)
VLDL: 33.8 mg/dL (ref 0.0–40.0)

## 2016-04-14 LAB — TSH: TSH: 4.93 u[IU]/mL — ABNORMAL HIGH (ref 0.35–4.50)

## 2016-04-14 LAB — PSA: PSA: 0.74 ng/mL (ref 0.10–4.00)

## 2016-04-16 ENCOUNTER — Encounter: Payer: Self-pay | Admitting: Internal Medicine

## 2016-04-16 ENCOUNTER — Ambulatory Visit (INDEPENDENT_AMBULATORY_CARE_PROVIDER_SITE_OTHER): Payer: 59 | Admitting: Internal Medicine

## 2016-04-16 VITALS — BP 126/82 | HR 58 | Temp 97.6°F | Ht 69.0 in | Wt 225.0 lb

## 2016-04-16 DIAGNOSIS — R739 Hyperglycemia, unspecified: Secondary | ICD-10-CM | POA: Diagnosis not present

## 2016-04-16 DIAGNOSIS — G4700 Insomnia, unspecified: Secondary | ICD-10-CM

## 2016-04-16 DIAGNOSIS — Z0000 Encounter for general adult medical examination without abnormal findings: Secondary | ICD-10-CM | POA: Diagnosis not present

## 2016-04-16 LAB — POCT GLYCOSYLATED HEMOGLOBIN (HGB A1C): Hemoglobin A1C: 6.5

## 2016-04-16 MED ORDER — VALSARTAN 160 MG PO TABS: 160.0000 mg | ORAL_TABLET | Freq: Every day | ORAL | 3 refills | Status: DC

## 2016-04-16 MED ORDER — ZOLPIDEM TARTRATE 10 MG PO TABS: 10.0000 mg | ORAL_TABLET | Freq: Every evening | ORAL | 1 refills | Status: DC | PRN

## 2016-04-16 MED ORDER — GLIPIZIDE ER 5 MG PO TB24: 5.0000 mg | ORAL_TABLET | Freq: Every day | ORAL | 3 refills | Status: DC

## 2016-04-16 NOTE — Assessment & Plan Note (Signed)
Ok for [employer]

## 2016-04-16 NOTE — Progress Notes (Addendum)
Subjective:    Patient ID: Troy Parsons, male    DOB: January 08, 1961, 56 y.o.   MRN: [id number]  HPI  Here for wellness and f/u;  Overall doing ok;  Pt denies Chest pain, worsening SOB, DOE, wheezing, orthopnea, PND, worsening LE edema, palpitations, dizziness or syncope.  Pt denies neurological change such as new headache, facial or extremity weakness.  Pt denies polydipsia, polyuria, or low sugar symptoms. Pt states overall good compliance with treatment and medications, good tolerability, and has been trying to follow appropriate diet.  Pt denies worsening depressive symptoms, suicidal ideation or panic. No fever, night sweats, wt loss, loss of appetite, or other constitutional symptoms.  Pt states good ability with ADL's, has low fall risk, home safety reviewed and adequate, no other significant changes in hearing or vision, and only occasionally active with exercise. Last a1c qpprox 11 lats visit., with metformin increased to bid. Wt Readings from Last 3 Encounters:  04/16/16 225 lb (102.1 kg)  01/17/16 224 lb (101.6 kg)  11/07/15 230 lb (104.3 kg)  Asks for ambien for infrequent insomnia use, last refill approx 3 yrs ago / Past Medical History:  Diagnosis Date  . Abnormal liver function   . Allergy   . Depression   . Esophageal reflux   . Fatty liver   . Gout   . Hemorrhoids   . Hiatal hernia   . Hyperlipemia   . Type II or unspecified type diabetes mellitus without mention of complication, not stated as uncontrolled    Past Surgical History:  Procedure Laterality Date  . APPENDECTOMY    . NASAL SEPTUM SURGERY  2010  . UMBILICAL HERNIA REPAIR  2009    reports that he has never smoked. He has never used smokeless tobacco. He reports that he drinks alcohol. He reports that he does not use drugs. family history includes Cancer in his other; Diabetes in his mother and paternal grandfather; Heart disease in his other; Hyperlipidemia in his other; Hypertension in his other; Stroke in  his other. Allergies  Allergen Reactions  . Lipitor [Atorvastatin]    Current Outpatient Prescriptions on File Prior to Visit  Medication Sig Dispense Refill  . ACCU-CHEK FASTCLIX LANCETS MISC Use to help check blood sugars twice a day Dx E11.9 102 each 3  . allopurinol (ZYLOPRIM) 300 MG tablet Take 1 tablet (300 mg total) by mouth daily. 90 tablet 3  . aspirin EC 81 MG tablet Take 1 tablet (81 mg total) by mouth daily. 90 tablet 11  . Blood Glucose Monitoring Suppl (ACCU-CHEK AVIVA PLUS) w/Device KIT Use to check blood sugars twice a day Dx E11.9 1 kit 0  . cyclobenzaprine (FLEXERIL) 5 MG tablet Take 1 tablet (5 mg total) by mouth 3 (three) times daily as needed for muscle spasms. 40 tablet 1  . glucose blood test strip 1 each by Other route 2 (two) times daily. Use to check blood sugars twice a day Dx E11.9 100 each 3  . ibuprofen (ADVIL,MOTRIN) 800 MG tablet TAKE 1 TABLET(800 MG) BY MOUTH TWICE DAILY AS NEEDED 60 tablet 0  . metFORMIN (GLUCOPHAGE-XR) 500 MG 24 hr tablet 2 by mouth in the AM 180 tablet 3  . omeprazole (PRILOSEC) 20 MG capsule TAKE 1 CAPSULE BY MOUTH TWICE DAILY 180 capsule 1  . simvastatin (ZOCOR) 40 MG tablet TAKE 1 TABLET BY MOUTH EVERY NIGHT AT BEDTIME 90 tablet 0  . zolpidem (AMBIEN) 10 MG tablet Take 1 tablet (10 mg total) by mouth  at bedtime as needed for sleep. 30 tablet 2   No current facility-administered medications on file prior to visit.    Review of Systems Constitutional: Negative for increased diaphoresis, or other activity, appetite or siginficant weight change other than noted HENT: Negative for worsening hearing loss, ear pain, facial swelling, mouth sores and neck stiffness.   Eyes: Negative for other worsening pain, redness or visual disturbance.  Respiratory: Negative for choking or stridor Cardiovascular: Negative for other chest pain and palpitations.  Gastrointestinal: Negative for worsening diarrhea, blood in stool, or abdominal  distention Genitourinary: Negative for hematuria, flank pain or change in urine volume.  Musculoskeletal: Negative for myalgias or other joint complaints.  Skin: Negative for other color change and wound or drainage.  Neurological: Negative for syncope and numbness. other than noted Hematological: Negative for adenopathy. or other swelling Psychiatric/Behavioral: Negative for hallucinations, SI, self-injury, decreased concentration or other worsening agitation.  All other system neg per pt    Objective:   Physical Exam BP 126/82   Pulse (!) 58   Temp 97.6 F (36.4 C)   Ht '5\' 9"'$  (1.753 m)   Wt 225 lb (102.1 kg)   SpO2 98%   BMI 33.23 kg/m  VS noted, obese Constitutional: Pt is oriented to person, place, and time. Appears well-developed and well-nourished, in no significant distress Head: Normocephalic and atraumatic  Eyes: Conjunctivae and EOM are normal. Pupils are equal, round, and reactive to light Right Ear: External ear normal.  Left Ear: External ear normal Nose: Nose normal.  Mouth/Throat: Oropharynx is clear and moist  Neck: Normal range of motion. Neck supple. No JVD present. No tracheal deviation present or significant neck LA or mass Cardiovascular: Normal rate, regular rhythm, normal heart sounds and intact distal pulses.   Pulmonary/Chest: Effort normal and breath sounds without rales or wheezing  Abdominal: Soft. Bowel sounds are normal. NT. No HSM  Musculoskeletal: Normal range of motion. Exhibits no edema Lymphadenopathy: Has no cervical adenopathy.  Neurological: Pt is alert and oriented to person, place, and time. Pt has normal reflexes. No cranial nerve deficit. Motor grossly intact Skin: Skin is warm and dry. No rash noted or new ulcers Psychiatric:  Has normal mood and affect. Behavior is normal.  No other exam findings  ECG today I have personally interpreted Sinus  Bradycardia  WITHIN NORMAL LIMITS  POCT A1C  Order: [id number]  Status:  Final result  Visible to patient:  No (Not Released) Dx:  Hyperglycemia   1d ago  Hemoglobin A1C 6.5     Specimen Collected: 04/16/16 16:56 Last Resulted: 04/16/16 16:56         Lab Results  Component Value Date   WBC 5.4 04/14/2016   HGB 15.6 04/14/2016   HCT 45.7 04/14/2016   PLT 211.0 04/14/2016   GLUCOSE 134 (H) 04/14/2016   CHOL 156 04/14/2016   TRIG 169.0 (H) 04/14/2016   HDL 42.30 04/14/2016   LDLDIRECT 90.0 06/23/2014   LDLCALC 80 04/14/2016   ALT 62 (H) 04/14/2016   AST 36 04/14/2016   NA 143 04/14/2016   K 4.3 04/14/2016   CL 104 04/14/2016   CREATININE 0.86 04/14/2016   BUN 17 04/14/2016   CO2 29 04/14/2016   TSH 4.93 (H) 04/14/2016   PSA 0.74 04/14/2016   INR 1.0 03/29/2010   HGBA1C 6.5 04/16/2016   MICROALBUR 0.9 12/08/2007       Assessment & Plan:

## 2016-04-16 NOTE — Assessment & Plan Note (Signed)
stable overall by history and exam, recent data reviewed with pt, and pt to continue medical treatment as before,  to f/u any worsening symptoms or concerns Lab Results  Component Value Date   HGBA1C 6.5 04/16/2016   

## 2016-04-16 NOTE — Assessment & Plan Note (Signed)

## 2016-04-16 NOTE — Patient Instructions (Addendum)
You had the pneumonia shot today  Your A1c was OK.  Your EKG was OK  Please continue all other medications as before, and refills have been done if requested.  Please have the pharmacy call with any other refills you may need.  Please continue your efforts at being more active, low cholesterol diet, and weight control.  You are otherwise up to date with prevention measures today.  Please keep your appointments with your specialists as you may have planned  Please return in 6 months, or sooner if needed, with Lab testing done 3-5 days before

## 2016-04-17 ENCOUNTER — Encounter: Payer: Self-pay | Admitting: Internal Medicine

## 2016-04-17 DIAGNOSIS — E119 Type 2 diabetes mellitus without complications: Secondary | ICD-10-CM | POA: Diagnosis not present

## 2016-04-17 LAB — HM DIABETES EYE EXAM

## 2016-04-21 ENCOUNTER — Encounter: Payer: Self-pay | Admitting: Internal Medicine

## 2016-04-21 ENCOUNTER — Encounter: Payer: Self-pay | Admitting: *Deleted

## 2016-04-22 MED ORDER — SILDENAFIL CITRATE 100 MG PO TABS: 50.0000 mg | ORAL_TABLET | Freq: Every day | ORAL | 11 refills | Status: DC | PRN

## 2016-05-09 ENCOUNTER — Other Ambulatory Visit: Payer: Self-pay | Admitting: Internal Medicine

## 2016-07-02 ENCOUNTER — Other Ambulatory Visit: Payer: Self-pay | Admitting: Internal Medicine

## 2016-07-23 DIAGNOSIS — H6981 Other specified disorders of Eustachian tube, right ear: Secondary | ICD-10-CM | POA: Diagnosis not present

## 2016-07-23 DIAGNOSIS — H93293 Other abnormal auditory perceptions, bilateral: Secondary | ICD-10-CM | POA: Diagnosis not present

## 2016-07-29 ENCOUNTER — Other Ambulatory Visit: Payer: Self-pay | Admitting: Internal Medicine

## 2016-08-04 ENCOUNTER — Telehealth: Payer: Self-pay | Admitting: Gastroenterology

## 2016-08-04 NOTE — Telephone Encounter (Signed)
Patient with abdominal pain and cramping.  He will come in and see Alonza Bogus, PA this week.

## 2016-08-04 NOTE — Telephone Encounter (Signed)
Patient reports that he will need to call me back.

## 2016-08-07 ENCOUNTER — Ambulatory Visit (INDEPENDENT_AMBULATORY_CARE_PROVIDER_SITE_OTHER): Payer: 59 | Admitting: Gastroenterology

## 2016-08-07 ENCOUNTER — Encounter: Payer: Self-pay | Admitting: Gastroenterology

## 2016-08-07 VITALS — BP 132/76 | HR 59 | Ht 69.0 in | Wt 229.0 lb

## 2016-08-07 DIAGNOSIS — R197 Diarrhea, unspecified: Secondary | ICD-10-CM | POA: Diagnosis not present

## 2016-08-07 DIAGNOSIS — A09 Infectious gastroenteritis and colitis, unspecified: Secondary | ICD-10-CM | POA: Diagnosis not present

## 2016-08-07 MED ORDER — RIFAXIMIN 550 MG PO TABS: 550.0000 mg | ORAL_TABLET | Freq: Three times a day (TID) | ORAL | 0 refills | Status: AC

## 2016-08-07 NOTE — Patient Instructions (Addendum)
If you are age 54 or older, your body mass index should be between 23-30. Your Body mass index is 33.82 kg/m. If this is out of the aforementioned range listed, please consider follow up with your Primary Care Provider.  If you are age 30 or younger, your body mass index should be between 19-25. Your Body mass index is 33.82 kg/m. If this is out of the aformentioned range listed, please consider follow up with your Primary Care Provider.   Your physician has requested that you go to the basement for lab work before leaving today.  We have sent the following medications to your pharmacy for you to pick up at your convenience: Xifaxan  Start daily probiotic: Florastor or VSL#3 (samples given)  Thank you for choosing me and [facility].   Alonza Bogus, PA-C

## 2016-08-07 NOTE — Progress Notes (Signed)
FAROOQ PETROVICH 56 y.o. [birth year] [id number]  Assessment & Plan:    1. Gastroenteritis - Not clear at this point whether it's viral or bacterial, but given his frequent international travel (for work) and his plans to leave for a week on Saturday, will prescribe a round of xifaxan '550mg'$  TID x 2 weeks, and give him a sample of VSL #3. GI panel + ova and parasites ordered. Advised bland diet and sufficient fluids, and to call with questions or concerns.   Subjective:   Chief Complaint: Abdominal pain, diarrhea x 1 week  HPI Mr. Dreibelbis is a 56 year old man who is complaining of 1 week of abdominal cramping and 4-5 loose, non-bloody stools per day.  This is associated with some nausea, but no vomiting, and he has felt feverish off an on this week as well. He has woken up at night a few times with cramping and a bowel movement. Is able to keep down fluids and light foods.   Denies recent antibiotic use or changes in medications, and says he's never had any GI issues with his diabetes meds. No sick contacts, no raw or unusual restaurant experiences in the past 2 weeks. He does travel internationally for work, and his last trip was to [place] 6 weeks ago.   He does mention a left inguinal hernia repair with mesh "about 8 years go" which he is concerned may be implicated here, but denies any pain or bulging in the area.   Allergies  Allergen Reactions  . Lipitor [Atorvastatin]    Current Meds  Medication Sig  . ACCU-CHEK FASTCLIX LANCETS MISC Use to help check blood sugars twice a day Dx E11.9  . allopurinol (ZYLOPRIM) 300 MG tablet Take 1 tablet (300 mg total) by mouth daily.  . Blood Glucose Monitoring Suppl (ACCU-CHEK AVIVA PLUS) w/Device KIT Use to check blood sugars twice a day Dx E11.9  . cyclobenzaprine (FLEXERIL) 5 MG tablet Take 1 tablet (5 mg total) by mouth 3 (three) times daily as needed for muscle spasms.  Marland Kitchen glipiZIDE (GLUCOTROL XL) 5 MG 24 hr tablet Take 1 tablet (5 mg  total) by mouth daily with breakfast.  . glucose blood test strip 1 each by Other route 2 (two) times daily. Use to check blood sugars twice a day Dx E11.9  . ibuprofen (ADVIL,MOTRIN) 800 MG tablet TAKE 1 TABLET(800 MG) BY MOUTH TWICE DAILY AS NEEDED  . metFORMIN (GLUCOPHAGE-XR) 500 MG 24 hr tablet 2 by mouth in the AM  . omeprazole (PRILOSEC) 20 MG capsule TAKE 1 CAPSULE BY MOUTH TWICE DAILY  . sildenafil (VIAGRA) 100 MG tablet Take 0.5-1 tablets (50-100 mg total) by mouth daily as needed for erectile dysfunction.  . simvastatin (ZOCOR) 40 MG tablet TAKE 1 TABLET BY MOUTH EVERY NIGHT AT BEDTIME  . valsartan (DIOVAN) 160 MG tablet Take 1 tablet (160 mg total) by mouth daily.  Marland Kitchen zolpidem (AMBIEN) 10 MG tablet Take 1 tablet (10 mg total) by mouth at bedtime as needed for sleep.   Past Medical History:  Diagnosis Date  . Abnormal liver function   . Allergy   . Depression   . Esophageal reflux   . Fatty liver   . Gout   . Hemorrhoids   . Hiatal hernia   . Hyperlipemia   . Type II or unspecified type diabetes mellitus without mention of complication, not stated as uncontrolled    Past Surgical History:  Procedure Laterality Date  . APPENDECTOMY    .  NASAL SEPTUM SURGERY  2010  . UMBILICAL HERNIA REPAIR  2009   Social History   Social History  . Marital status: Single    Spouse name: N/A  . Number of children: N/A  . Years of education: N/A   Occupational History  . rep for [employer]     [place] History Main Topics  . Smoking status: Never Smoker  . Smokeless tobacco: Never Used     Comment: Soil scientist  . Alcohol use Yes  . Drug use: No  . Sexual activity: Not on file   Other Topics Concern  . Not on file   Social History Narrative   Daily caffeine   family history includes Cancer in his other; Diabetes in his mother and paternal grandfather; Heart disease in his other; Hyperlipidemia in his other; Hypertension in his other; Stroke  in his other.   Review of Systems Noted in HPI - otherwise negative     Objective:   Physical Exam  '@BP'$  132/76 (BP Location: Left Arm, Patient Position: Sitting, Cuff Size: Normal)   Pulse (!) 59   Ht '5\' 9"'$  (1.753 m)   Wt 229 lb (103.9 kg)   BMI 33.82 kg/m @  General:  Well-developed, well-nourished and in no acute distress Eyes:  anicteric. Neck:   supple w/o thyromegaly or mass.  Lungs: Clear to auscultation bilaterally. Heart:  S1S2, no rubs, murmurs, gallops. Abdomen: Mild generalized TTP. Soft, no hepatosplenomegaly, hernia, or mass and BS+.  Rectal: Not done. Lymph:  no cervical or supraclavicular adenopathy. Extremities:   no edema, cyanosis or clubbing Skin   no rash. Neuro:  A&O x 3.  Psych:  appropriate mood and  Affect.   Data Reviewed: Prior notes reviewed    Janeece Riggers, PA-S

## 2016-08-07 NOTE — Progress Notes (Signed)
Reviewed and agree with initial management plan.  Sharan Mcenaney T. Malaika Arnall, MD FACG 

## 2016-09-16 ENCOUNTER — Encounter: Payer: Self-pay | Admitting: Internal Medicine

## 2016-09-16 MED ORDER — IRBESARTAN 150 MG PO TABS: 150.0000 mg | ORAL_TABLET | Freq: Every day | ORAL | 3 refills | Status: DC

## 2016-09-16 NOTE — Telephone Encounter (Signed)
Ok to change diovan to avapro 150 qd

## 2016-09-17 DIAGNOSIS — J301 Allergic rhinitis due to pollen: Secondary | ICD-10-CM | POA: Diagnosis not present

## 2016-10-21 ENCOUNTER — Other Ambulatory Visit: Payer: Self-pay | Admitting: Internal Medicine

## 2016-10-21 ENCOUNTER — Encounter: Payer: Self-pay | Admitting: Internal Medicine

## 2016-10-21 MED ORDER — IBUPROFEN 800 MG PO TABS: 800.0000 mg | ORAL_TABLET | Freq: Two times a day (BID) | ORAL | 0 refills | Status: DC | PRN

## 2016-11-14 ENCOUNTER — Other Ambulatory Visit: Payer: Self-pay | Admitting: Internal Medicine

## 2016-12-08 ENCOUNTER — Other Ambulatory Visit: Payer: Self-pay | Admitting: Internal Medicine

## 2016-12-09 ENCOUNTER — Encounter: Payer: Self-pay | Admitting: Internal Medicine

## 2016-12-10 MED ORDER — ZOLPIDEM TARTRATE 10 MG PO TABS: 10.0000 mg | ORAL_TABLET | Freq: Every evening | ORAL | 2 refills | Status: DC | PRN

## 2016-12-10 NOTE — Telephone Encounter (Signed)
Done hardcopy to Shirron  

## 2016-12-25 ENCOUNTER — Encounter: Payer: Self-pay | Admitting: Internal Medicine

## 2016-12-25 ENCOUNTER — Other Ambulatory Visit (INDEPENDENT_AMBULATORY_CARE_PROVIDER_SITE_OTHER): Payer: 59

## 2016-12-25 ENCOUNTER — Ambulatory Visit: Payer: 59 | Admitting: Internal Medicine

## 2016-12-25 VITALS — BP 118/76 | HR 62 | Temp 97.9°F | Ht 69.0 in | Wt 230.0 lb

## 2016-12-25 DIAGNOSIS — R197 Diarrhea, unspecified: Secondary | ICD-10-CM

## 2016-12-25 DIAGNOSIS — R1084 Generalized abdominal pain: Secondary | ICD-10-CM

## 2016-12-25 DIAGNOSIS — I10 Essential (primary) hypertension: Secondary | ICD-10-CM | POA: Diagnosis not present

## 2016-12-25 DIAGNOSIS — E119 Type 2 diabetes mellitus without complications: Secondary | ICD-10-CM | POA: Diagnosis not present

## 2016-12-25 DIAGNOSIS — R109 Unspecified abdominal pain: Secondary | ICD-10-CM | POA: Insufficient documentation

## 2016-12-25 LAB — URINALYSIS, ROUTINE W REFLEX MICROSCOPIC
BILIRUBIN URINE: NEGATIVE
Hgb urine dipstick: NEGATIVE
Ketones, ur: NEGATIVE
LEUKOCYTES UA: NEGATIVE
Nitrite: NEGATIVE
PH: 6 (ref 5.0–8.0)
RBC / HPF: NONE SEEN (ref 0–?)
Specific Gravity, Urine: 1.01 (ref 1.000–1.030)
Total Protein, Urine: NEGATIVE
Urine Glucose: NEGATIVE
Urobilinogen, UA: 0.2 (ref 0.0–1.0)

## 2016-12-25 LAB — HEPATIC FUNCTION PANEL
ALBUMIN: 4.4 g/dL (ref 3.5–5.2)
ALK PHOS: 53 U/L (ref 39–117)
ALT: 92 U/L — ABNORMAL HIGH (ref 0–53)
AST: 48 U/L — AB (ref 0–37)
Bilirubin, Direct: 0.1 mg/dL (ref 0.0–0.3)
TOTAL PROTEIN: 6.9 g/dL (ref 6.0–8.3)
Total Bilirubin: 0.5 mg/dL (ref 0.2–1.2)

## 2016-12-25 LAB — BASIC METABOLIC PANEL
BUN: 16 mg/dL (ref 6–23)
CALCIUM: 9.5 mg/dL (ref 8.4–10.5)
CO2: 27 mEq/L (ref 19–32)
CREATININE: 0.85 mg/dL (ref 0.40–1.50)
Chloride: 103 mEq/L (ref 96–112)
GFR: 99.69 mL/min (ref >60.00)
Glucose, Bld: 135 mg/dL — ABNORMAL HIGH (ref 70–99)
Potassium: 4.2 mEq/L (ref 3.5–5.1)
Sodium: 138 mEq/L (ref 135–145)

## 2016-12-25 LAB — CBC WITH DIFFERENTIAL/PLATELET
BASOS ABS: 0.1 10*3/uL (ref 0.0–0.1)
Basophils Relative: 0.9 % (ref 0.0–3.0)
Eosinophils Absolute: 0.1 10*3/uL (ref 0.0–0.7)
Eosinophils Relative: 1.8 % (ref 0.0–5.0)
HEMATOCRIT: 46.3 % (ref 39.0–52.0)
HEMOGLOBIN: 15.3 g/dL (ref 13.0–17.0)
LYMPHS PCT: 34.8 % (ref 12.0–46.0)
Lymphs Abs: 2 10*3/uL (ref 0.7–4.0)
MCHC: 33 g/dL (ref 30.0–36.0)
MCV: 91.6 fl (ref 78.0–100.0)
MONOS PCT: 12.8 % — AB (ref 3.0–12.0)
Monocytes Absolute: 0.7 10*3/uL (ref 0.1–1.0)
Neutro Abs: 2.8 10*3/uL (ref 1.4–7.7)
Neutrophils Relative %: 49.7 % (ref 43.0–77.0)
Platelets: 219 10*3/uL (ref 150.0–400.0)
RBC: 5.06 Mil/uL (ref 4.22–5.81)
RDW: 13.3 % (ref 11.5–15.5)
WBC: 5.6 10*3/uL (ref 4.0–10.5)

## 2016-12-25 LAB — LIPASE: LIPASE: 28 U/L (ref 11.0–59.0)

## 2016-12-25 NOTE — Progress Notes (Signed)
 Subjective:    Patient ID: Troy Parsons, male    DOB: 11/23/1960, 56 y.o.   MRN: [id number]  HPI  Here with 10 days onset diffuse mild crampy abd pains, intermittent watery diarrhea and bloating; some better with peptobismol, nothing else makes better or worse, without high fever, chills, Denies worsening reflux, dysphagia, n/v, or blood.  Incidentally it seems has been better and worse every few days including worse yesterday, better today Pt denies chest pain, increased sob or doe, wheezing, orthopnea, PND, increased LE swelling, palpitations, dizziness or syncope.  Pt denies polydipsia, polyuria, Past Medical History:  Diagnosis Date  . Abnormal liver function   . Allergy   . Depression   . Esophageal reflux   . Fatty liver   . Gout   . Hemorrhoids   . Hiatal hernia   . Hyperlipemia   . Type II or unspecified type diabetes mellitus without mention of complication, not stated as uncontrolled    Past Surgical History:  Procedure Laterality Date  . APPENDECTOMY    . NASAL SEPTUM SURGERY  2010  . UMBILICAL HERNIA REPAIR  2009    reports that  has never smoked. he has never used smokeless tobacco. He reports that he drinks alcohol. He reports that he does not use drugs. family history includes Cancer in his other; Colon cancer in his unknown relative; Diabetes in his mother and paternal grandfather; Heart disease in his other; Hyperlipidemia in his other; Hypertension in his other; Stroke in his other. Allergies  Allergen Reactions  . Lipitor [Atorvastatin]    Current Outpatient Medications on File Prior to Visit  Medication Sig Dispense Refill  . ACCU-CHEK FASTCLIX LANCETS MISC Use to help check blood sugars twice a day Dx E11.9 102 each 3  . allopurinol (ZYLOPRIM) 300 MG tablet Take 1 tablet (300 mg total) by mouth daily. 90 tablet 3  . Blood Glucose Monitoring Suppl (ACCU-CHEK AVIVA PLUS) w/Device KIT Use to check blood sugars twice a day Dx E11.9 1 kit 0  . cyclobenzaprine  (FLEXERIL) 5 MG tablet Take 1 tablet (5 mg total) by mouth 3 (three) times daily as needed for muscle spasms. 40 tablet 1  . glipiZIDE (GLUCOTROL XL) 5 MG 24 hr tablet Take 1 tablet (5 mg total) by mouth daily with breakfast. 90 tablet 3  . glucose blood test strip 1 each by Other route 2 (two) times daily. Use to check blood sugars twice a day Dx E11.9 100 each 3  . ibuprofen (ADVIL,MOTRIN) 800 MG tablet TAKE 1 TABLET(800 MG) BY MOUTH TWICE DAILY AS NEEDED 60 tablet 2  . irbesartan (AVAPRO) 150 MG tablet Take 1 tablet (150 mg total) by mouth daily. 90 tablet 3  . metFORMIN (GLUCOPHAGE-XR) 500 MG 24 hr tablet 2 by mouth in the AM 180 tablet 3  . omeprazole (PRILOSEC) 20 MG capsule TAKE 1 CAPSULE BY MOUTH TWICE DAILY 180 capsule 1  . sildenafil (VIAGRA) 100 MG tablet Take 0.5-1 tablets (50-100 mg total) by mouth daily as needed for erectile dysfunction. 5 tablet 11  . simvastatin (ZOCOR) 40 MG tablet TAKE 1 TABLET BY MOUTH EVERY NIGHT AT BEDTIME 90 tablet 3  . zolpidem (AMBIEN) 10 MG tablet Take 1 tablet (10 mg total) by mouth at bedtime as needed for sleep. 30 tablet 2   No current facility-administered medications on file prior to visit.    Review of Systems  Constitutional: Negative for other unusual diaphoresis or sweats HENT: Negative for ear discharge or   swelling Eyes: Negative for other worsening visual disturbances Respiratory: Negative for stridor or other swelling  Gastrointestinal: Negative for worsening distension or other blood Genitourinary: Negative for retention or other urinary change Musculoskeletal: Negative for other MSK pain or swelling Skin: Negative for color change or other new lesions Neurological: Negative for worsening tremors and other numbness  Psychiatric/Behavioral: Negative for worsening agitation or other fatigue All other system neg per pt    Objective:   Physical Exam BP 118/76   Pulse 62   Temp 97.9 F (36.6 C) (Oral)   Ht 5' 9" (1.753 m)   Wt 230  lb (104.3 kg)   SpO2 99%   BMI 33.97 kg/m  VS noted,  Constitutional: Pt appears in NAD HENT: Head: NCAT.  Right Ear: External ear normal.  Left Ear: External ear normal.  Eyes: . Pupils are equal, round, and reactive to light. Conjunctivae and EOM are normal Nose: without d/c or deformity Neck: Neck supple. Gross normal ROM Cardiovascular: Normal rate and regular rhythm.   Pulmonary/Chest: Effort normal and breath sounds without rales or wheezing.  Abd:  Soft, NT, ND, + BS, no organomegaly Neurological: Pt is alert. At baseline orientation, motor grossly intact Skin: Skin is warm. No rashes, other new lesions, no LE edema Psychiatric: Pt behavior is normal without agitation  No other exam findings    Assessment & Plan:

## 2016-12-25 NOTE — Patient Instructions (Signed)
Please continue all other medications as before, and consider immodium if needed  Please have the pharmacy call with any other refills you may need.  Please continue your efforts at being more active, low cholesterol diet, and weight control.  Please keep your appointments with your specialists as you may have planned  Please go to the LAB in the Basement (turn left off the elevator) for the tests to be done today  You will be contacted by phone if any changes need to be made immediately.  Otherwise, you will receive a letter about your results with an explanation, but please check with MyChart first.  Please remember to sign up for MyChart if you have not done so, as this will be important to you in the future with finding out test results, communicating by private email, and scheduling acute appointments online when needed.

## 2016-12-28 NOTE — Assessment & Plan Note (Signed)
Exam benign, hold on imaging at this time,  to f/u any worsening symptoms or concerns

## 2016-12-28 NOTE — Assessment & Plan Note (Signed)
Etiology unclear, ? Viral vs other, doubt c diff, for labs as ordered, consider empiric tx pending clinical course

## 2016-12-28 NOTE — Assessment & Plan Note (Signed)
BP Readings from Last 3 Encounters:  12/25/16 118/76  08/07/16 132/76  04/16/16 126/82

## 2016-12-28 NOTE — Assessment & Plan Note (Signed)
stable overall by history and exam, recent data reviewed with pt, and pt to continue medical treatment as before,  to f/u any worsening symptoms or concerns Lab Results  Component Value Date   HGBA1C 6.5 04/16/2016   

## 2017-01-15 ENCOUNTER — Other Ambulatory Visit: Payer: Self-pay | Admitting: Internal Medicine

## 2017-02-09 ENCOUNTER — Other Ambulatory Visit: Payer: Self-pay | Admitting: Internal Medicine

## 2017-02-19 ENCOUNTER — Encounter: Payer: Self-pay | Admitting: Internal Medicine

## 2017-02-19 DIAGNOSIS — L821 Other seborrheic keratosis: Secondary | ICD-10-CM | POA: Diagnosis not present

## 2017-02-19 DIAGNOSIS — D225 Melanocytic nevi of trunk: Secondary | ICD-10-CM | POA: Diagnosis not present

## 2017-02-19 DIAGNOSIS — L820 Inflamed seborrheic keratosis: Secondary | ICD-10-CM | POA: Diagnosis not present

## 2017-02-19 DIAGNOSIS — D1801 Hemangioma of skin and subcutaneous tissue: Secondary | ICD-10-CM | POA: Diagnosis not present

## 2017-02-19 DIAGNOSIS — D485 Neoplasm of uncertain behavior of skin: Secondary | ICD-10-CM | POA: Diagnosis not present

## 2017-02-19 MED ORDER — METFORMIN HCL ER 500 MG PO TB24: ORAL_TABLET | ORAL | 0 refills | Status: DC

## 2017-02-26 ENCOUNTER — Other Ambulatory Visit: Payer: Self-pay | Admitting: Internal Medicine

## 2017-03-04 ENCOUNTER — Other Ambulatory Visit: Payer: Self-pay | Admitting: Internal Medicine

## 2017-03-15 ENCOUNTER — Other Ambulatory Visit: Payer: Self-pay | Admitting: Internal Medicine

## 2017-03-28 ENCOUNTER — Other Ambulatory Visit: Payer: Self-pay | Admitting: Internal Medicine

## 2017-04-13 ENCOUNTER — Encounter: Payer: Self-pay | Admitting: Internal Medicine

## 2017-04-13 DIAGNOSIS — Z0000 Encounter for general adult medical examination without abnormal findings: Secondary | ICD-10-CM

## 2017-04-14 ENCOUNTER — Other Ambulatory Visit: Payer: Self-pay | Admitting: Internal Medicine

## 2017-04-14 ENCOUNTER — Other Ambulatory Visit (INDEPENDENT_AMBULATORY_CARE_PROVIDER_SITE_OTHER): Payer: 59

## 2017-04-14 DIAGNOSIS — E119 Type 2 diabetes mellitus without complications: Secondary | ICD-10-CM | POA: Diagnosis not present

## 2017-04-14 DIAGNOSIS — Z0000 Encounter for general adult medical examination without abnormal findings: Secondary | ICD-10-CM

## 2017-04-14 LAB — BASIC METABOLIC PANEL
BUN: 20 mg/dL (ref 6–23)
CO2: 26 mEq/L (ref 19–32)
Calcium: 9.8 mg/dL (ref 8.4–10.5)
Chloride: 102 mEq/L (ref 96–112)
Creatinine, Ser: 0.82 mg/dL (ref 0.40–1.50)
GFR: 103.8 mL/min (ref >60.00)
Glucose, Bld: 136 mg/dL — ABNORMAL HIGH (ref 70–99)
Potassium: 4 mEq/L (ref 3.5–5.1)
Sodium: 137 mEq/L (ref 135–145)

## 2017-04-14 LAB — URINALYSIS, ROUTINE W REFLEX MICROSCOPIC
Bilirubin Urine: NEGATIVE
Hgb urine dipstick: NEGATIVE
Ketones, ur: NEGATIVE
Leukocytes, UA: NEGATIVE
Nitrite: NEGATIVE
RBC / HPF: NONE SEEN (ref 0–?)
Specific Gravity, Urine: <=1.005 — AB (ref 1.000–1.030)
Total Protein, Urine: NEGATIVE
Urine Glucose: NEGATIVE
Urobilinogen, UA: 0.2 (ref 0.0–1.0)
pH: 6 (ref 5.0–8.0)

## 2017-04-14 LAB — CBC WITH DIFFERENTIAL/PLATELET
Basophils Absolute: 0 10*3/uL (ref 0.0–0.1)
Basophils Relative: 0.7 % (ref 0.0–3.0)
Eosinophils Absolute: 0.1 10*3/uL (ref 0.0–0.7)
Eosinophils Relative: 2.1 % (ref 0.0–5.0)
HCT: 44 % (ref 39.0–52.0)
Hemoglobin: 15 g/dL (ref 13.0–17.0)
Lymphocytes Relative: 34.4 % (ref 12.0–46.0)
Lymphs Abs: 1.9 10*3/uL (ref 0.7–4.0)
MCHC: 34.1 g/dL (ref 30.0–36.0)
MCV: 89.6 fl (ref 78.0–100.0)
Monocytes Absolute: 0.5 10*3/uL (ref 0.1–1.0)
Monocytes Relative: 9.7 % (ref 3.0–12.0)
Neutro Abs: 3 10*3/uL (ref 1.4–7.7)
Neutrophils Relative %: 53.1 % (ref 43.0–77.0)
Platelets: 194 10*3/uL (ref 150.0–400.0)
RBC: 4.91 Mil/uL (ref 4.22–5.81)
RDW: 13 % (ref 11.5–15.5)
WBC: 5.6 10*3/uL (ref 4.0–10.5)

## 2017-04-14 LAB — LIPID PANEL
Cholesterol: 150 mg/dL (ref 0–200)
HDL: 39.7 mg/dL (ref >39.00)
NonHDL: 110.19
Total CHOL/HDL Ratio: 4
Triglycerides: 286 mg/dL — ABNORMAL HIGH (ref 0.0–149.0)
VLDL: 57.2 mg/dL — ABNORMAL HIGH (ref 0.0–40.0)

## 2017-04-14 LAB — PSA: PSA: 0.74 ng/mL (ref 0.10–4.00)

## 2017-04-14 LAB — TSH: TSH: 4.75 u[IU]/mL — ABNORMAL HIGH (ref 0.35–4.50)

## 2017-04-14 LAB — HEMOGLOBIN A1C: HEMOGLOBIN A1C: 7.7 % — AB (ref 4.6–6.5)

## 2017-04-14 LAB — HEPATIC FUNCTION PANEL
ALT: 72 U/L — ABNORMAL HIGH (ref 0–53)
AST: 46 U/L — ABNORMAL HIGH (ref 0–37)
Albumin: 4.4 g/dL (ref 3.5–5.2)
Alkaline Phosphatase: 51 U/L (ref 39–117)
Bilirubin, Direct: 0.1 mg/dL (ref 0.0–0.3)
Total Bilirubin: 0.6 mg/dL (ref 0.2–1.2)
Total Protein: 6.6 g/dL (ref 6.0–8.3)

## 2017-04-14 LAB — LDL CHOLESTEROL, DIRECT: Direct LDL: 75 mg/dL

## 2017-04-15 LAB — HIV ANTIBODY (ROUTINE TESTING W REFLEX): HIV: NONREACTIVE

## 2017-04-16 ENCOUNTER — Ambulatory Visit (INDEPENDENT_AMBULATORY_CARE_PROVIDER_SITE_OTHER): Payer: 59 | Admitting: Internal Medicine

## 2017-04-16 ENCOUNTER — Encounter: Payer: Self-pay | Admitting: Internal Medicine

## 2017-04-16 VITALS — BP 122/84 | HR 80 | Temp 97.6°F | Ht 69.0 in | Wt 232.0 lb

## 2017-04-16 DIAGNOSIS — E785 Hyperlipidemia, unspecified: Secondary | ICD-10-CM

## 2017-04-16 DIAGNOSIS — Z23 Encounter for immunization: Secondary | ICD-10-CM | POA: Diagnosis not present

## 2017-04-16 DIAGNOSIS — Z0000 Encounter for general adult medical examination without abnormal findings: Secondary | ICD-10-CM | POA: Diagnosis not present

## 2017-04-16 DIAGNOSIS — E119 Type 2 diabetes mellitus without complications: Secondary | ICD-10-CM

## 2017-04-16 DIAGNOSIS — E039 Hypothyroidism, unspecified: Secondary | ICD-10-CM

## 2017-04-16 DIAGNOSIS — I10 Essential (primary) hypertension: Secondary | ICD-10-CM | POA: Diagnosis not present

## 2017-04-16 DIAGNOSIS — H31001 Unspecified chorioretinal scars, right eye: Secondary | ICD-10-CM | POA: Diagnosis not present

## 2017-04-16 LAB — HM DIABETES EYE EXAM

## 2017-04-16 MED ORDER — LEVOTHYROXINE SODIUM 25 MCG PO TABS: 25.0000 ug | ORAL_TABLET | Freq: Every day | ORAL | 3 refills | Status: DC

## 2017-04-16 MED ORDER — IBUPROFEN 800 MG PO TABS: ORAL_TABLET | ORAL | 2 refills | Status: DC

## 2017-04-16 MED ORDER — METFORMIN HCL ER 500 MG PO TB24: ORAL_TABLET | ORAL | 3 refills | Status: DC

## 2017-04-16 MED ORDER — ZOSTER VAC RECOMB ADJUVANTED 50 MCG/0.5ML IM SUSR: 0.5000 mL | Freq: Once | INTRAMUSCULAR | 1 refills | Status: AC

## 2017-04-16 NOTE — Assessment & Plan Note (Signed)
New onset, subclinical, for start low dose levothyroxine 25 mcg

## 2017-04-16 NOTE — Progress Notes (Signed)
Subjective:    Patient ID: Troy Parsons, male    DOB: Dec 06, 1960, 57 y.o.   MRN: [id number]  HPI  Here for wellness and f/u;  Overall doing ok;  Pt denies Chest pain, worsening SOB, DOE, wheezing, orthopnea, PND, worsening LE edema, palpitations, dizziness or syncope.  Pt denies neurological change such as new headache, facial or extremity weakness.  Pt denies polydipsia, polyuria, or low sugar symptoms. Pt states overall good compliance with treatment and medications, good tolerability, and has been trying to follow appropriate diet.  Pt denies worsening depressive symptoms, suicidal ideation or panic. No fever, night sweats, wt loss, loss of appetite, or other constitutional symptoms.  Pt states good ability with ADL's, has low fall risk, home safety reviewed and adequate, no other significant changes in hearing or vision, and occasionally active with exercise.  Had recent eye exam without BDR.  CBG's recently have been about 200. Wt Readings from Last 3 Encounters:  04/16/17 232 lb (105.2 kg)  12/25/16 230 lb (104.3 kg)  08/07/16 229 lb (103.9 kg)  No other interval hx or new complaints Past Medical History:  Diagnosis Date  . Abnormal liver function   . Allergy   . Depression   . Esophageal reflux   . Fatty liver   . Gout   . Hemorrhoids   . Hiatal hernia   . Hyperlipemia   . Type II or unspecified type diabetes mellitus without mention of complication, not stated as uncontrolled    Past Surgical History:  Procedure Laterality Date  . APPENDECTOMY    . NASAL SEPTUM SURGERY  2010  . UMBILICAL HERNIA REPAIR  2009    reports that  has never smoked. he has never used smokeless tobacco. He reports that he drinks alcohol. He reports that he does not use drugs. family history includes Cancer in his other; Colon cancer in his unknown relative; Diabetes in his mother and paternal grandfather; Heart disease in his other; Hyperlipidemia in his other; Hypertension in his other; Stroke in  his other. Allergies  Allergen Reactions  . Lipitor [Atorvastatin]    Current Outpatient Medications on File Prior to Visit  Medication Sig Dispense Refill  . ACCU-CHEK AVIVA PLUS test strip USE TO TEST BLOOD SUGAR TWICE DAILY 100 each 0  . ACCU-CHEK SOFTCLIX LANCETS lancets USE TO HELP CHECK BLOOD SUGAR TWICE DAILY 100 each 0  . allopurinol (ZYLOPRIM) 300 MG tablet TAKE 1 TABLET(300 MG) BY MOUTH DAILY 90 tablet 0  . Blood Glucose Monitoring Suppl (ACCU-CHEK AVIVA PLUS) w/Device KIT Use to check blood sugars twice a day Dx E11.9 1 kit 0  . cyclobenzaprine (FLEXERIL) 5 MG tablet Take 1 tablet (5 mg total) by mouth 3 (three) times daily as needed for muscle spasms. 40 tablet 1  . glipiZIDE (GLUCOTROL XL) 5 MG 24 hr tablet TAKE 1 TABLET(5 MG) BY MOUTH DAILY WITH BREAKFAST 90 tablet 0  . irbesartan (AVAPRO) 150 MG tablet Take 1 tablet (150 mg total) by mouth daily. 90 tablet 3  . metFORMIN (GLUCOPHAGE-XR) 500 MG 24 hr tablet TAKE 2 TABLETS BY MOUTH IN THE MORNING 180 tablet 0  . omeprazole (PRILOSEC) 20 MG capsule TAKE 1 CAPSULE BY MOUTH TWICE DAILY 180 capsule 1  . sildenafil (VIAGRA) 100 MG tablet Take 0.5-1 tablets (50-100 mg total) by mouth daily as needed for erectile dysfunction. 5 tablet 11  . simvastatin (ZOCOR) 40 MG tablet TAKE 1 TABLET BY MOUTH EVERY NIGHT AT BEDTIME 90 tablet 3  .  zolpidem (AMBIEN) 10 MG tablet Take 1 tablet (10 mg total) by mouth at bedtime as needed for sleep. 30 tablet 2   No current facility-administered medications on file prior to visit.    Review of Systems Constitutional: Negative for other unusual diaphoresis, sweats, appetite or weight changes HENT: Negative for other worsening hearing loss, ear pain, facial swelling, mouth sores or neck stiffness.   Eyes: Negative for other worsening pain, redness or other visual disturbance.  Respiratory: Negative for other stridor or swelling Cardiovascular: Negative for other palpitations or other chest pain    Gastrointestinal: Negative for worsening diarrhea or loose stools, blood in stool, distention or other pain Genitourinary: Negative for hematuria, flank pain or other change in urine volume.  Musculoskeletal: Negative for myalgias or other joint swelling.  Skin: Negative for other color change, or other wound or worsening drainage.  Neurological: Negative for other syncope or numbness. Hematological: Negative for other adenopathy or swelling Psychiatric/Behavioral: Negative for hallucinations, other worsening agitation, SI, self-injury, or new decreased concentration All other system neg per pt    Objective:   Physical Exam BP 122/84   Pulse 80   Temp 97.6 F (36.4 C) (Oral)   Ht 5' 9" (1.753 m)   Wt 232 lb (105.2 kg)   SpO2 95%   BMI 34.26 kg/m  VS noted,  Constitutional: Pt is oriented to person, place, and time. Appears well-developed and well-nourished, in no significant distress and comfortable Head: Normocephalic and atraumatic  Eyes: Conjunctivae and EOM are normal. Pupils are equal, round, and reactive to light Right Ear: External ear normal without discharge Left Ear: External ear normal without discharge Nose: Nose without discharge or deformity Mouth/Throat: Oropharynx is without other ulcerations and moist  Neck: Normal range of motion. Neck supple. No JVD present. No tracheal deviation present or significant neck LA or mass Cardiovascular: Normal rate, regular rhythm, normal heart sounds and intact distal pulses.   Pulmonary/Chest: WOB normal and breath sounds without rales or wheezing  Abdominal: Soft. Bowel sounds are normal. NT. No HSM  Musculoskeletal: Normal range of motion. Exhibits no edema Lymphadenopathy: Has no other cervical adenopathy.  Neurological: Pt is alert and oriented to person, place, and time. Pt has normal reflexes. No cranial nerve deficit. Motor grossly intact, Gait intact Skin: Skin is warm and dry. No rash noted or new  ulcerations Psychiatric:  Has normal mood and affect. Behavior is normal without agitation No other exam findings Lab Results  Component Value Date   WBC 5.6 04/14/2017   HGB 15.0 04/14/2017   HCT 44.0 04/14/2017   PLT 194.0 04/14/2017   GLUCOSE 136 (H) 04/14/2017   CHOL 150 04/14/2017   TRIG 286.0 (H) 04/14/2017   HDL 39.70 04/14/2017   LDLDIRECT 75.0 04/14/2017   LDLCALC 80 04/14/2016   ALT 72 (H) 04/14/2017   AST 46 (H) 04/14/2017   NA 137 04/14/2017   K 4.0 04/14/2017   CL 102 04/14/2017   CREATININE 0.82 04/14/2017   BUN 20 04/14/2017   CO2 26 04/14/2017   TSH 4.75 (H) 04/14/2017   PSA 0.74 04/14/2017   INR 1.0 03/29/2010   HGBA1C 7.7 (H) 04/14/2017   MICROALBUR 0.9 12/08/2007      Assessment & Plan:   

## 2017-04-16 NOTE — Patient Instructions (Addendum)
You had the Pneumovax pneumonia shot today  Your shingles shot prescription was sent to [facility]  Please take all new medication as prescribed - the low dose thyroid medication  OK to increase the Metformin ER 500 mg to 3 per day (either 2 in the AM/1 in PM as you suggested or 3 in the AM)  Please continue all other medications as before, and refills have been done if requested.  Please have the pharmacy call with any other refills you may need.  Please continue your efforts at being more active, low cholesterol diet, and weight control.  You are otherwise up to date with prevention measures today.  Please keep your appointments with your specialists as you may have planned  Please return in 6 months, or sooner if needed, with Lab testing done 3-5 days before

## 2017-04-19 NOTE — Assessment & Plan Note (Signed)
Lab Results  Component Value Date   LDLCALC 80 04/14/2016  stable overall by history and exam, recent data reviewed with pt, and pt to continue medical treatment as before,  to f/u any worsening symptoms or concerns

## 2017-04-19 NOTE — Assessment & Plan Note (Signed)
stable overall by history and exam, recent data reviewed with pt, and pt to continue medical treatment as before,  to f/u any worsening symptoms or concerns BP Readings from Last 3 Encounters:  04/16/17 122/84  12/25/16 118/76  08/07/16 132/76

## 2017-04-19 NOTE — Assessment & Plan Note (Signed)

## 2017-04-19 NOTE — Assessment & Plan Note (Signed)
Mild uncontrolled, for increased metformin ER 500 to 3 per day

## 2017-04-22 DIAGNOSIS — R0982 Postnasal drip: Secondary | ICD-10-CM | POA: Diagnosis not present

## 2017-05-03 ENCOUNTER — Other Ambulatory Visit: Payer: Self-pay | Admitting: Internal Medicine

## 2017-05-07 ENCOUNTER — Other Ambulatory Visit: Payer: Self-pay | Admitting: Internal Medicine

## 2017-05-07 MED ORDER — ALLOPURINOL 300 MG PO TABS: 300.0000 mg | ORAL_TABLET | Freq: Every day | ORAL | 1 refills | Status: DC

## 2017-05-13 DIAGNOSIS — M545 Low back pain: Secondary | ICD-10-CM | POA: Diagnosis not present

## 2017-05-15 DIAGNOSIS — M545 Low back pain: Secondary | ICD-10-CM | POA: Diagnosis not present

## 2017-05-19 DIAGNOSIS — M545 Low back pain: Secondary | ICD-10-CM | POA: Diagnosis not present

## 2017-05-24 DIAGNOSIS — M542 Cervicalgia: Secondary | ICD-10-CM | POA: Diagnosis not present

## 2017-05-29 DIAGNOSIS — M545 Low back pain: Secondary | ICD-10-CM | POA: Diagnosis not present

## 2017-05-31 ENCOUNTER — Other Ambulatory Visit: Payer: Self-pay | Admitting: Internal Medicine

## 2017-06-02 ENCOUNTER — Other Ambulatory Visit: Payer: Self-pay | Admitting: Internal Medicine

## 2017-06-04 ENCOUNTER — Encounter: Payer: Self-pay | Admitting: Internal Medicine

## 2017-06-04 MED ORDER — SILDENAFIL CITRATE 100 MG PO TABS: 50.0000 mg | ORAL_TABLET | Freq: Every day | ORAL | 11 refills | Status: DC | PRN

## 2017-06-05 DIAGNOSIS — M545 Low back pain: Secondary | ICD-10-CM | POA: Diagnosis not present

## 2017-06-08 ENCOUNTER — Other Ambulatory Visit: Payer: 59

## 2017-06-08 ENCOUNTER — Encounter: Payer: Self-pay | Admitting: Internal Medicine

## 2017-06-08 DIAGNOSIS — Z021 Encounter for pre-employment examination: Secondary | ICD-10-CM

## 2017-06-09 LAB — RUBEOLA ANTIBODY IGG: Rubeola IgG: <25 AU/mL — ABNORMAL LOW

## 2017-06-10 ENCOUNTER — Telehealth: Payer: Self-pay

## 2017-06-10 NOTE — Telephone Encounter (Signed)
Pt has viewed results via MyChart  

## 2017-06-10 NOTE — Telephone Encounter (Signed)
-----  Message from Biagio Borg, MD sent at 06/09/2017  5:07 PM EDT ----- Left message on MyChart, pt to cont same tx except  The test results show that your current treatment is OK, except the measles test is negative.  We should proceed with an MMR booster, and I will have the office call you to schedule a Nurse Visit for this.    Rena Sweeden to please inform pt, and assist with Nurse Visit for MMR booster

## 2017-06-11 NOTE — Telephone Encounter (Signed)
sihrron or staff to help pt make Nurse Visit for booster MMR

## 2017-06-12 DIAGNOSIS — M545 Low back pain: Secondary | ICD-10-CM | POA: Diagnosis not present

## 2017-06-16 ENCOUNTER — Ambulatory Visit (INDEPENDENT_AMBULATORY_CARE_PROVIDER_SITE_OTHER): Payer: 59

## 2017-06-16 DIAGNOSIS — Z23 Encounter for immunization: Secondary | ICD-10-CM

## 2017-06-22 ENCOUNTER — Other Ambulatory Visit: Payer: Self-pay | Admitting: Internal Medicine

## 2017-06-24 DIAGNOSIS — M545 Low back pain: Secondary | ICD-10-CM | POA: Diagnosis not present

## 2017-07-03 ENCOUNTER — Other Ambulatory Visit: Payer: Self-pay | Admitting: Internal Medicine

## 2017-07-13 DIAGNOSIS — M545 Low back pain: Secondary | ICD-10-CM | POA: Diagnosis not present

## 2017-08-18 DIAGNOSIS — M545 Low back pain: Secondary | ICD-10-CM | POA: Diagnosis not present

## 2017-08-26 DIAGNOSIS — M545 Low back pain: Secondary | ICD-10-CM | POA: Diagnosis not present

## 2017-08-31 ENCOUNTER — Other Ambulatory Visit: Payer: Self-pay | Admitting: Internal Medicine

## 2017-09-02 DIAGNOSIS — M545 Low back pain: Secondary | ICD-10-CM | POA: Diagnosis not present

## 2017-09-08 DIAGNOSIS — M545 Low back pain: Secondary | ICD-10-CM | POA: Diagnosis not present

## 2017-09-22 ENCOUNTER — Other Ambulatory Visit: Payer: Self-pay | Admitting: Internal Medicine

## 2017-09-22 DIAGNOSIS — M545 Low back pain: Secondary | ICD-10-CM | POA: Diagnosis not present

## 2017-09-22 MED ORDER — GLUCOSE BLOOD VI STRP: ORAL_STRIP | 0 refills | Status: DC

## 2017-09-22 MED ORDER — ACCU-CHEK SOFTCLIX LANCETS MISC: 0 refills | Status: DC

## 2017-09-22 NOTE — Telephone Encounter (Signed)
Please advise 

## 2017-10-05 DIAGNOSIS — M545 Low back pain: Secondary | ICD-10-CM | POA: Diagnosis not present

## 2017-10-05 DIAGNOSIS — M542 Cervicalgia: Secondary | ICD-10-CM | POA: Diagnosis not present

## 2017-10-15 ENCOUNTER — Ambulatory Visit: Payer: 59 | Admitting: Internal Medicine

## 2017-10-19 DIAGNOSIS — M545 Low back pain: Secondary | ICD-10-CM | POA: Diagnosis not present

## 2017-10-20 ENCOUNTER — Other Ambulatory Visit (INDEPENDENT_AMBULATORY_CARE_PROVIDER_SITE_OTHER): Payer: 59

## 2017-10-20 DIAGNOSIS — E119 Type 2 diabetes mellitus without complications: Secondary | ICD-10-CM

## 2017-10-20 DIAGNOSIS — E039 Hypothyroidism, unspecified: Secondary | ICD-10-CM | POA: Diagnosis not present

## 2017-10-20 LAB — BASIC METABOLIC PANEL
BUN: 18 mg/dL (ref 6–23)
CHLORIDE: 100 meq/L (ref 96–112)
CO2: 23 meq/L (ref 19–32)
Calcium: 9.8 mg/dL (ref 8.4–10.5)
Creatinine, Ser: 1.05 mg/dL (ref 0.40–1.50)
GFR: 77.88 mL/min (ref >60.00)
GLUCOSE: 193 mg/dL — AB (ref 70–99)
POTASSIUM: 4.1 meq/L (ref 3.5–5.1)
Sodium: 137 mEq/L (ref 135–145)

## 2017-10-20 LAB — HEPATIC FUNCTION PANEL
ALBUMIN: 4.6 g/dL (ref 3.5–5.2)
ALT: 108 U/L — ABNORMAL HIGH (ref 0–53)
AST: 67 U/L — ABNORMAL HIGH (ref 0–37)
Alkaline Phosphatase: 55 U/L (ref 39–117)
BILIRUBIN TOTAL: 0.6 mg/dL (ref 0.2–1.2)
Bilirubin, Direct: 0.1 mg/dL (ref 0.0–0.3)
Total Protein: 7.3 g/dL (ref 6.0–8.3)

## 2017-10-20 LAB — LIPID PANEL
CHOL/HDL RATIO: 4
Cholesterol: 146 mg/dL (ref 0–200)
HDL: 35.2 mg/dL — AB (ref >39.00)

## 2017-10-20 LAB — T4, FREE: FREE T4: 0.77 ng/dL (ref 0.60–1.60)

## 2017-10-20 LAB — TSH: TSH: 3.58 u[IU]/mL (ref 0.35–4.50)

## 2017-10-20 LAB — HEMOGLOBIN A1C: Hgb A1c MFr Bld: 9.2 % — ABNORMAL HIGH (ref 4.6–6.5)

## 2017-10-20 LAB — LDL CHOLESTEROL, DIRECT: Direct LDL: 66 mg/dL

## 2017-10-26 ENCOUNTER — Ambulatory Visit (INDEPENDENT_AMBULATORY_CARE_PROVIDER_SITE_OTHER): Payer: 59 | Admitting: Internal Medicine

## 2017-10-26 ENCOUNTER — Encounter: Payer: Self-pay | Admitting: Internal Medicine

## 2017-10-26 VITALS — BP 118/70 | HR 73 | Temp 97.9°F | Ht 69.0 in | Wt 233.0 lb

## 2017-10-26 DIAGNOSIS — E785 Hyperlipidemia, unspecified: Secondary | ICD-10-CM | POA: Diagnosis not present

## 2017-10-26 DIAGNOSIS — E119 Type 2 diabetes mellitus without complications: Secondary | ICD-10-CM | POA: Diagnosis not present

## 2017-10-26 DIAGNOSIS — E039 Hypothyroidism, unspecified: Secondary | ICD-10-CM

## 2017-10-26 DIAGNOSIS — I10 Essential (primary) hypertension: Secondary | ICD-10-CM | POA: Diagnosis not present

## 2017-10-26 DIAGNOSIS — Z0000 Encounter for general adult medical examination without abnormal findings: Secondary | ICD-10-CM

## 2017-10-26 MED ORDER — GLIPIZIDE ER 10 MG PO TB24: 10.0000 mg | ORAL_TABLET | Freq: Every day | ORAL | 3 refills | Status: DC

## 2017-10-26 MED ORDER — ALLOPURINOL 300 MG PO TABS: 300.0000 mg | ORAL_TABLET | Freq: Every day | ORAL | 1 refills | Status: DC

## 2017-10-26 MED ORDER — METFORMIN HCL ER 500 MG PO TB24: ORAL_TABLET | ORAL | 3 refills | Status: DC

## 2017-10-26 NOTE — Assessment & Plan Note (Signed)
stable overall by history and exam, recent data reviewed with pt, and pt to continue medical treatment as before,  to f/u any worsening symptoms or concerns  

## 2017-10-26 NOTE — Assessment & Plan Note (Signed)
Uncontrolled, for increased activity, diet control, increase the glipizide ER 10 qd, and increase metformin ER 500 to 4 tabs per day., o/w stable overall by history and exam, recent data reviewed with pt, and pt to continue medical treatment as before,  to f/u any worsening symptoms or concerns

## 2017-10-26 NOTE — Patient Instructions (Addendum)
Ok to increase the glipizide ER to 10 mg per day  OK to increase the metformin ER 500 mg to 4 pills per day  Please continue all other medications as before, and refills have been done if requested.  Please have the pharmacy call with any other refills you may need.  Please continue your efforts at being more active, low cholesterol low fat diet, and weight control.  Please keep your appointments with your specialists as you may have planned  Please return in 6 months, or sooner if needed, with Lab testing done 3-5 days before

## 2017-10-26 NOTE — Progress Notes (Signed)
Subjective:    Patient ID: Troy Parsons, male    DOB: Jan 11, 1961, 57 y.o.   MRN: [id number]  HPI  Here to f/u; overall doing ok,  Pt denies chest pain, increasing sob or doe, wheezing, orthopnea, PND, increased LE swelling, palpitations, dizziness or syncope.  Pt denies new neurological symptoms such as new headache, or facial or extremity weakness or numbness.  Pt denies polydipsia, polyuria, or low sugar episode.  Pt states overall good compliance with meds, mostly trying to follow appropriate diet, with wt overall stable,  but little exercise however, travels quite a bit for work Past Medical History:  Diagnosis Date  . Abnormal liver function   . Allergy   . Depression   . Esophageal reflux   . Fatty liver   . Gout   . Hemorrhoids   . Hiatal hernia   . Hyperlipemia   . Type II or unspecified type diabetes mellitus without mention of complication, not stated as uncontrolled    Past Surgical History:  Procedure Laterality Date  . APPENDECTOMY    . NASAL SEPTUM SURGERY  2010  . UMBILICAL HERNIA REPAIR  2009    reports that he has never smoked. He has never used smokeless tobacco. He reports that he drinks alcohol. He reports that he does not use drugs. family history includes Cancer in his other; Colon cancer in his unknown relative; Diabetes in his mother and paternal grandfather; Heart disease in his other; Hyperlipidemia in his other; Hypertension in his other; Stroke in his other. Allergies  Allergen Reactions  . Lipitor [Atorvastatin]    Current Outpatient Medications on File Prior to Visit  Medication Sig Dispense Refill  . ACCU-CHEK SOFTCLIX LANCETS lancets USE TO HELP CHECK BLOOD SUGAR TWICE DAILY 100 each 0  . Blood Glucose Monitoring Suppl (ACCU-CHEK AVIVA PLUS) w/Device KIT Use to check blood sugars twice a day Dx E11.9 1 kit 0  . cyclobenzaprine (FLEXERIL) 5 MG tablet Take 1 tablet (5 mg total) by mouth 3 (three) times daily as needed for muscle spasms. 40 tablet  1  . glucose blood (ACCU-CHEK AVIVA PLUS) test strip Use as instructed 100 each 0  . ibuprofen (ADVIL,MOTRIN) 800 MG tablet TAKE 1 TABLET(800 MG) BY MOUTH TWICE DAILY AS NEEDED 60 tablet 2  . irbesartan (AVAPRO) 150 MG tablet TAKE 1 TABLET(150 MG) BY MOUTH DAILY 90 tablet 2  . levothyroxine (SYNTHROID, LEVOTHROID) 25 MCG tablet Take 1 tablet (25 mcg total) by mouth daily before breakfast. 90 tablet 3  . omeprazole (PRILOSEC) 20 MG capsule TAKE 1 CAPSULE BY MOUTH TWICE DAILY 180 capsule 3  . sildenafil (VIAGRA) 100 MG tablet Take 0.5-1 tablets (50-100 mg total) by mouth daily as needed for erectile dysfunction. 5 tablet 11  . simvastatin (ZOCOR) 40 MG tablet TAKE 1 TABLET BY MOUTH EVERY NIGHT AT BEDTIME 90 tablet 3  . zolpidem (AMBIEN) 10 MG tablet Take 1 tablet (10 mg total) by mouth at bedtime as needed for sleep. 30 tablet 2   No current facility-administered medications on file prior to visit.    Review of Systems  Constitutional: Negative for other unusual diaphoresis or sweats HENT: Negative for ear discharge or swelling Eyes: Negative for other worsening visual disturbances Respiratory: Negative for stridor or other swelling  Gastrointestinal: Negative for worsening distension or other blood Genitourinary: Negative for retention or other urinary change Musculoskeletal: Negative for other MSK pain or swelling Skin: Negative for color change or other new lesions Neurological: Negative for worsening  tremors and other numbness  Psychiatric/Behavioral: Negative for worsening agitation or other fatigue All other system neg per pt    Objective:   Physical Exam BP 118/70   Pulse 73   Temp 97.9 F (36.6 C) (Oral)   Ht '5\' 9"'$  (1.753 m)   Wt 233 lb (105.7 kg)   SpO2 95%   BMI 34.41 kg/m  VS noted,  Constitutional: Pt appears in NAD HENT: Head: NCAT.  Right Ear: External ear normal.  Left Ear: External ear normal.  Eyes: . Pupils are equal, round, and reactive to light. Conjunctivae  and EOM are normal Nose: without d/c or deformity Neck: Neck supple. Gross normal ROM Cardiovascular: Normal rate and regular rhythm.   Pulmonary/Chest: Effort normal and breath sounds without rales or wheezing.  Abd:  Soft, NT, ND, + BS, no organomegaly Neurological: Pt is alert. At baseline orientation, motor grossly intact Skin: Skin is warm. No rashes, other new lesions, no LE edema Psychiatric: Pt behavior is normal without agitation  No other exam findings Lab Results  Component Value Date   WBC 5.6 04/14/2017   HGB 15.0 04/14/2017   HCT 44.0 04/14/2017   PLT 194.0 04/14/2017   GLUCOSE 193 (H) 10/20/2017   CHOL 146 10/20/2017   TRIG (H) 10/20/2017    432.0 Triglyceride is over 400; calculations on Lipids are invalid.   HDL 35.20 (L) 10/20/2017   LDLDIRECT 66.0 10/20/2017   LDLCALC 80 04/14/2016   ALT 108 (H) 10/20/2017   AST 67 (H) 10/20/2017   NA 137 10/20/2017   K 4.1 10/20/2017   CL 100 10/20/2017   CREATININE 1.05 10/20/2017   BUN 18 10/20/2017   CO2 23 10/20/2017   TSH 3.58 10/20/2017   PSA 0.74 04/14/2017   INR 1.0 03/29/2010   HGBA1C 9.2 (H) 10/20/2017   MICROALBUR 0.9 12/08/2007       Assessment & Plan:

## 2017-10-28 DIAGNOSIS — M545 Low back pain: Secondary | ICD-10-CM | POA: Diagnosis not present

## 2017-11-03 DIAGNOSIS — M542 Cervicalgia: Secondary | ICD-10-CM | POA: Diagnosis not present

## 2017-11-07 ENCOUNTER — Other Ambulatory Visit: Payer: Self-pay | Admitting: Internal Medicine

## 2017-11-13 DIAGNOSIS — M545 Low back pain: Secondary | ICD-10-CM | POA: Diagnosis not present

## 2017-11-16 DIAGNOSIS — M545 Low back pain: Secondary | ICD-10-CM | POA: Diagnosis not present

## 2017-12-08 DIAGNOSIS — M545 Low back pain: Secondary | ICD-10-CM | POA: Diagnosis not present

## 2017-12-10 ENCOUNTER — Other Ambulatory Visit: Payer: Self-pay | Admitting: Internal Medicine

## 2017-12-14 DIAGNOSIS — M545 Low back pain: Secondary | ICD-10-CM | POA: Diagnosis not present

## 2017-12-22 DIAGNOSIS — M545 Low back pain: Secondary | ICD-10-CM | POA: Diagnosis not present

## 2017-12-29 DIAGNOSIS — M545 Low back pain: Secondary | ICD-10-CM | POA: Diagnosis not present

## 2018-01-01 ENCOUNTER — Other Ambulatory Visit: Payer: Self-pay | Admitting: Internal Medicine

## 2018-01-05 DIAGNOSIS — M545 Low back pain: Secondary | ICD-10-CM | POA: Diagnosis not present

## 2018-01-11 ENCOUNTER — Encounter: Payer: Self-pay | Admitting: Podiatry

## 2018-01-11 ENCOUNTER — Ambulatory Visit (INDEPENDENT_AMBULATORY_CARE_PROVIDER_SITE_OTHER): Payer: 59 | Admitting: Podiatry

## 2018-01-11 VITALS — BP 155/98 | HR 59

## 2018-01-11 DIAGNOSIS — L600 Ingrowing nail: Secondary | ICD-10-CM | POA: Diagnosis not present

## 2018-01-11 MED ORDER — CEPHALEXIN 500 MG PO CAPS: 500.0000 mg | ORAL_CAPSULE | Freq: Three times a day (TID) | ORAL | 0 refills | Status: DC

## 2018-01-11 NOTE — Patient Instructions (Signed)

## 2018-01-12 ENCOUNTER — Encounter: Payer: Self-pay | Admitting: Internal Medicine

## 2018-01-12 MED ORDER — FREESTYLE LIBRE 14 DAY SENSOR MISC: 1.0000 | 3 refills | Status: DC

## 2018-01-12 MED ORDER — FREESTYLE LIBRE 14 DAY READER DEVI: 1.0000 | Freq: Four times a day (QID) | 0 refills | Status: AC | PRN

## 2018-01-14 DIAGNOSIS — L600 Ingrowing nail: Secondary | ICD-10-CM | POA: Insufficient documentation

## 2018-01-14 NOTE — Progress Notes (Signed)
Subjective:   Patient ID: Troy Parsons, male   DOB: 57 y.o.   MRN: [id number]   HPI 57 year old male presents the office today for concerns of ingrown toenails to the right big toe, pointing the lateral aspect.  He states he does not have a history of ingrown toenails and this started about 2 weeks ago.  Is been soaking and putting antibiotic ointment on it but is still tender mostly towards the tip of the lateral toenail on the right side.  He states that may have started after he was doing a lot on his feet getting ready to put Christmas decorations up.  Other than this he has had no other treatment and he has no recent injury that he can recall.  Last A1c was 9.2  Review of Systems  All other systems reviewed and are negative.  Past Medical History:  Diagnosis Date  . Abnormal liver function   . Allergy   . Depression   . Esophageal reflux   . Fatty liver   . Gout   . Hemorrhoids   . Hiatal hernia   . Hyperlipemia   . Type II or unspecified type diabetes mellitus without mention of complication, not stated as uncontrolled     Past Surgical History:  Procedure Laterality Date  . APPENDECTOMY    . NASAL SEPTUM SURGERY  2010  . UMBILICAL HERNIA REPAIR  2009     Current Outpatient Medications:  .  ACCU-CHEK AVIVA PLUS test strip, USE TO TEST BLOOD SUGAR TWICE DAILY, Disp: 100 each, Rfl: 0 .  ACCU-CHEK SOFTCLIX LANCETS lancets, USE TO CHECK BLOOD SUGAR TWICE DAILY, Disp: 100 each, Rfl: 0 .  allopurinol (ZYLOPRIM) 300 MG tablet, Take 1 tablet (300 mg total) by mouth daily., Disp: 90 tablet, Rfl: 1 .  Blood Glucose Monitoring Suppl (ACCU-CHEK AVIVA PLUS) w/Device KIT, Use to check blood sugars twice a day Dx E11.9, Disp: 1 kit, Rfl: 0 .  cyclobenzaprine (FLEXERIL) 5 MG tablet, Take 1 tablet (5 mg total) by mouth 3 (three) times daily as needed for muscle spasms., Disp: 40 tablet, Rfl: 1 .  glipiZIDE (GLUCOTROL XL) 10 MG 24 hr tablet, Take 1 tablet (10 mg total) by mouth daily  with breakfast., Disp: 90 tablet, Rfl: 3 .  glucose blood (ACCU-CHEK AVIVA PLUS) test strip, Use as instructed, Disp: 100 each, Rfl: 0 .  ibuprofen (ADVIL,MOTRIN) 800 MG tablet, TAKE 1 TABLET(800 MG) BY MOUTH TWICE DAILY AS NEEDED, Disp: 60 tablet, Rfl: 2 .  irbesartan (AVAPRO) 150 MG tablet, TAKE 1 TABLET(150 MG) BY MOUTH DAILY, Disp: 90 tablet, Rfl: 2 .  levothyroxine (SYNTHROID, LEVOTHROID) 25 MCG tablet, Take 1 tablet (25 mcg total) by mouth daily before breakfast., Disp: 90 tablet, Rfl: 3 .  metFORMIN (GLUCOPHAGE-XR) 500 MG 24 hr tablet, 4 tabs by mouth in the AM, Disp: 360 tablet, Rfl: 3 .  omeprazole (PRILOSEC) 20 MG capsule, TAKE 1 CAPSULE BY MOUTH TWICE DAILY, Disp: 180 capsule, Rfl: 3 .  sildenafil (VIAGRA) 100 MG tablet, Take 0.5-1 tablets (50-100 mg total) by mouth daily as needed for erectile dysfunction., Disp: 5 tablet, Rfl: 11 .  simvastatin (ZOCOR) 40 MG tablet, TAKE 1 TABLET BY MOUTH EVERY NIGHT AT BEDTIME, Disp: 90 tablet, Rfl: 3 .  zolpidem (AMBIEN) 10 MG tablet, Take 1 tablet (10 mg total) by mouth at bedtime as needed for sleep., Disp: 30 tablet, Rfl: 2 .  cephALEXin (KEFLEX) 500 MG capsule, Take 1 capsule (500 mg total) by mouth 3 (  three) times daily., Disp: 30 capsule, Rfl: 0 .  Continuous Blood Gluc Receiver (FREESTYLE LIBRE 14 DAY READER) DEVI, Apply 1 Device topically 4 (four) times daily as needed. E11.9, Disp: 1 Device, Rfl: 0 .  Continuous Blood Gluc Sensor (FREESTYLE LIBRE 14 DAY SENSOR) MISC, Apply 1 Device topically every 14 (fourteen) days. E11.9, Disp: 6 each, Rfl: 3  Allergies  Allergen Reactions  . Lipitor [Atorvastatin]          Objective:  Physical Exam  General: AAO x3, NAD  Dermatological: Incurvation present on the lateral aspect of the right hallux toenail with localized edema and faint erythema but there is no drainage or pus there is no ascending cellulitis.  There is no fluctuation or crepitation or any malodor.  No other open  lesions.  Vascular: Dorsalis Pedis artery and Posterior Tibial artery pedal pulses are 2/4 bilateral with immedate capillary fill time. Pedal hair growth present. No varicosities and no lower extremity edema present bilateral. There is no pain with calf compression, swelling, warmth, erythema.   Neruologic: Grossly intact via light touch bilateral. Vibratory intact via tuning fork bilateral. Protective threshold with Semmes Wienstein monofilament intact to all pedal sites bilateral. Patellar and Achilles deep tendon reflexes 2+ bilateral. No Babinski or clonus noted bilateral.   Musculoskeletal: No gross boney pedal deformities bilateral. No pain, crepitus, or limitation noted with foot and ankle range of motion bilateral. Muscular strength 5/5 in all groups tested bilateral.  Gait: Unassisted, Nonantalgic.       Assessment:   57 year old male right lateral hallux symptomatic ingrown toenail    Plan:  -Treatment options discussed including all alternatives, risks, and complications -Etiology of symptoms were discussed -We discussed various treatment options.  He wishes to hold off on partial nail avulsion.  I did lightly debride the toenail with any complications or bleeding to remove the symptoms portion of ingrown toenail.  Recommend Epson salt soaks daily.  Cover with antibiotic ointment and a bandage. -Keflex  Trula Slade DPM

## 2018-01-18 ENCOUNTER — Other Ambulatory Visit: Payer: Self-pay | Admitting: Internal Medicine

## 2018-01-22 ENCOUNTER — Ambulatory Visit (INDEPENDENT_AMBULATORY_CARE_PROVIDER_SITE_OTHER): Payer: 59 | Admitting: Podiatry

## 2018-01-22 ENCOUNTER — Encounter: Payer: Self-pay | Admitting: Podiatry

## 2018-01-22 DIAGNOSIS — L600 Ingrowing nail: Secondary | ICD-10-CM

## 2018-01-22 DIAGNOSIS — M545 Low back pain: Secondary | ICD-10-CM | POA: Diagnosis not present

## 2018-01-22 NOTE — Patient Instructions (Signed)

## 2018-01-26 NOTE — Progress Notes (Signed)
Subjective: 57 year old male presents the office today for follow-up evaluation left ingrown toenail, erythema of the right big toe.  He states that he is doing much better and since being on antibiotics he has had improvement in swelling and redness.  He has not had any drainage or pus.  Is able to wear a regular shoe without any problems.  He is happy not to have the nail avulsion performed as he is doing better. Denies any systemic complaints such as fevers, chills, nausea, vomiting. No acute changes since last appointment, and no other complaints at this time.   Objective: AAO x3, NAD DP/PT pulses palpable bilaterally, CRT less than 3 seconds On the lateral aspect of the right hallux there is significant provement there is no significant tenderness to palpation.  There is no edema, erythema, drainage or pus identified today.  There is no malodor.  No ascending cellulitis. No open lesions or pre-ulcerative lesions.  No pain with calf compression, swelling, warmth, erythema  Assessment: Resolved pain, erythema right hallux  Plan: -All treatment options discussed with the patient including all alternatives, risks, complications.  -I did lightly debride some of the toenail again today without any complications or bleeding.  I do want to continue Epson salt soaks daily or washing with antibacterial soap and to apply antibiotic ointment and a bandage daily.  Monitor for any signs or symptoms of infection reconsult me know.  Otherwise he is doing well or hold off on a partial nail avulsion today but if symptoms recur electing to have this done. -Patient encouraged to call the office with any questions, concerns, change in symptoms.   Trula Slade DPM

## 2018-01-27 DIAGNOSIS — M545 Low back pain: Secondary | ICD-10-CM | POA: Diagnosis not present

## 2018-02-05 DIAGNOSIS — M545 Low back pain: Secondary | ICD-10-CM | POA: Diagnosis not present

## 2018-02-16 ENCOUNTER — Other Ambulatory Visit: Payer: Self-pay | Admitting: Internal Medicine

## 2018-02-19 DIAGNOSIS — M545 Low back pain: Secondary | ICD-10-CM | POA: Diagnosis not present

## 2018-02-21 DIAGNOSIS — M545 Low back pain: Secondary | ICD-10-CM | POA: Diagnosis not present

## 2018-02-22 ENCOUNTER — Other Ambulatory Visit: Payer: Self-pay | Admitting: Internal Medicine

## 2018-02-26 DIAGNOSIS — M545 Low back pain: Secondary | ICD-10-CM | POA: Diagnosis not present

## 2018-02-26 DIAGNOSIS — D1801 Hemangioma of skin and subcutaneous tissue: Secondary | ICD-10-CM | POA: Diagnosis not present

## 2018-02-26 DIAGNOSIS — D225 Melanocytic nevi of trunk: Secondary | ICD-10-CM | POA: Diagnosis not present

## 2018-02-26 DIAGNOSIS — L918 Other hypertrophic disorders of the skin: Secondary | ICD-10-CM | POA: Diagnosis not present

## 2018-02-26 DIAGNOSIS — L814 Other melanin hyperpigmentation: Secondary | ICD-10-CM | POA: Diagnosis not present

## 2018-03-03 DIAGNOSIS — M545 Low back pain: Secondary | ICD-10-CM | POA: Diagnosis not present

## 2018-03-03 DIAGNOSIS — M542 Cervicalgia: Secondary | ICD-10-CM | POA: Diagnosis not present

## 2018-03-23 DIAGNOSIS — M545 Low back pain: Secondary | ICD-10-CM | POA: Diagnosis not present

## 2018-03-30 DIAGNOSIS — M545 Low back pain: Secondary | ICD-10-CM | POA: Diagnosis not present

## 2018-04-04 ENCOUNTER — Other Ambulatory Visit: Payer: Self-pay | Admitting: Internal Medicine

## 2018-04-12 DIAGNOSIS — M545 Low back pain: Secondary | ICD-10-CM | POA: Diagnosis not present

## 2018-04-20 DIAGNOSIS — M545 Low back pain: Secondary | ICD-10-CM | POA: Diagnosis not present

## 2018-04-21 ENCOUNTER — Other Ambulatory Visit (INDEPENDENT_AMBULATORY_CARE_PROVIDER_SITE_OTHER): Payer: 59

## 2018-04-21 DIAGNOSIS — E119 Type 2 diabetes mellitus without complications: Secondary | ICD-10-CM | POA: Diagnosis not present

## 2018-04-21 DIAGNOSIS — Z0000 Encounter for general adult medical examination without abnormal findings: Secondary | ICD-10-CM | POA: Diagnosis not present

## 2018-04-21 LAB — TSH: TSH: 3.53 u[IU]/mL (ref 0.35–4.50)

## 2018-04-21 LAB — URINALYSIS, ROUTINE W REFLEX MICROSCOPIC
Bilirubin Urine: NEGATIVE
Hgb urine dipstick: NEGATIVE
Ketones, ur: NEGATIVE
Leukocytes,Ua: NEGATIVE
Nitrite: NEGATIVE
Specific Gravity, Urine: 1.025 (ref 1.000–1.030)
Total Protein, Urine: NEGATIVE
Urine Glucose: NEGATIVE
Urobilinogen, UA: 0.2 (ref 0.0–1.0)
pH: 6 (ref 5.0–8.0)

## 2018-04-21 LAB — CBC WITH DIFFERENTIAL/PLATELET
BASOS PCT: 0.9 % (ref 0.0–3.0)
Basophils Absolute: 0.1 10*3/uL (ref 0.0–0.1)
Eosinophils Absolute: 0.1 10*3/uL (ref 0.0–0.7)
Eosinophils Relative: 2.1 % (ref 0.0–5.0)
HCT: 44.5 % (ref 39.0–52.0)
Hemoglobin: 15.2 g/dL (ref 13.0–17.0)
Lymphocytes Relative: 37.7 % (ref 12.0–46.0)
Lymphs Abs: 2.2 10*3/uL (ref 0.7–4.0)
MCHC: 34.1 g/dL (ref 30.0–36.0)
MCV: 89.5 fl (ref 78.0–100.0)
Monocytes Absolute: 0.4 10*3/uL (ref 0.1–1.0)
Monocytes Relative: 7.3 % (ref 3.0–12.0)
NEUTROS ABS: 3.1 10*3/uL (ref 1.4–7.7)
Neutrophils Relative %: 52 % (ref 43.0–77.0)
PLATELETS: 233 10*3/uL (ref 150.0–400.0)
RBC: 4.97 Mil/uL (ref 4.22–5.81)
RDW: 13.3 % (ref 11.5–15.5)
WBC: 5.9 10*3/uL (ref 4.0–10.5)

## 2018-04-21 LAB — BASIC METABOLIC PANEL
BUN: 24 mg/dL — ABNORMAL HIGH (ref 6–23)
CO2: 24 mEq/L (ref 19–32)
Calcium: 10.2 mg/dL (ref 8.4–10.5)
Chloride: 100 mEq/L (ref 96–112)
Creatinine, Ser: 1.04 mg/dL (ref 0.40–1.50)
GFR: 73.95 mL/min (ref >60.00)
Glucose, Bld: 150 mg/dL — ABNORMAL HIGH (ref 70–99)
POTASSIUM: 4.7 meq/L (ref 3.5–5.1)
SODIUM: 136 meq/L (ref 135–145)

## 2018-04-21 LAB — MICROALBUMIN / CREATININE URINE RATIO
Creatinine,U: 165.1 mg/dL
Microalb Creat Ratio: 1.2 mg/g (ref 0.0–30.0)
Microalb, Ur: 2 mg/dL — ABNORMAL HIGH (ref 0.0–1.9)

## 2018-04-21 LAB — LIPID PANEL
Cholesterol: 158 mg/dL (ref 0–200)
HDL: 48.2 mg/dL (ref >39.00)
LDL Cholesterol: 76 mg/dL (ref 0–99)
NonHDL: 109.34
Total CHOL/HDL Ratio: 3
Triglycerides: 168 mg/dL — ABNORMAL HIGH (ref 0.0–149.0)
VLDL: 33.6 mg/dL (ref 0.0–40.0)

## 2018-04-21 LAB — HEPATIC FUNCTION PANEL
ALBUMIN: 4.9 g/dL (ref 3.5–5.2)
ALT: 60 U/L — ABNORMAL HIGH (ref 0–53)
AST: 35 U/L (ref 0–37)
Alkaline Phosphatase: 51 U/L (ref 39–117)
Bilirubin, Direct: 0.1 mg/dL (ref 0.0–0.3)
Total Bilirubin: 0.6 mg/dL (ref 0.2–1.2)
Total Protein: 6.9 g/dL (ref 6.0–8.3)

## 2018-04-21 LAB — HEMOGLOBIN A1C: Hgb A1c MFr Bld: 6.9 % — ABNORMAL HIGH (ref 4.6–6.5)

## 2018-04-21 LAB — PSA: PSA: 0.69 ng/mL (ref 0.10–4.00)

## 2018-04-23 DIAGNOSIS — E119 Type 2 diabetes mellitus without complications: Secondary | ICD-10-CM | POA: Diagnosis not present

## 2018-04-24 ENCOUNTER — Other Ambulatory Visit: Payer: Self-pay | Admitting: Internal Medicine

## 2018-04-26 ENCOUNTER — Ambulatory Visit (INDEPENDENT_AMBULATORY_CARE_PROVIDER_SITE_OTHER): Payer: 59 | Admitting: Internal Medicine

## 2018-04-26 ENCOUNTER — Encounter: Payer: Self-pay | Admitting: Internal Medicine

## 2018-04-26 ENCOUNTER — Other Ambulatory Visit: Payer: Self-pay

## 2018-04-26 ENCOUNTER — Ambulatory Visit (INDEPENDENT_AMBULATORY_CARE_PROVIDER_SITE_OTHER)
Admission: RE | Admit: 2018-04-26 | Discharge: 2018-04-26 | Disposition: A | Discharge status: Home / Self Care | Payer: 59 | Source: Ambulatory Visit | Attending: Internal Medicine | Admitting: Internal Medicine

## 2018-04-26 VITALS — BP 122/78 | HR 84 | Temp 97.8°F | Ht 69.0 in | Wt 227.0 lb

## 2018-04-26 DIAGNOSIS — E119 Type 2 diabetes mellitus without complications: Secondary | ICD-10-CM | POA: Diagnosis not present

## 2018-04-26 DIAGNOSIS — R079 Chest pain, unspecified: Secondary | ICD-10-CM

## 2018-04-26 DIAGNOSIS — Z0000 Encounter for general adult medical examination without abnormal findings: Secondary | ICD-10-CM

## 2018-04-26 MED ORDER — METFORMIN HCL ER 500 MG PO TB24: ORAL_TABLET | ORAL | 3 refills | Status: DC

## 2018-04-26 MED ORDER — LEVOTHYROXINE SODIUM 25 MCG PO TABS: ORAL_TABLET | ORAL | 1 refills | Status: DC

## 2018-04-26 MED ORDER — SIMVASTATIN 40 MG PO TABS: 40.0000 mg | ORAL_TABLET | Freq: Every day | ORAL | 3 refills | Status: DC

## 2018-04-26 MED ORDER — IRBESARTAN 150 MG PO TABS: ORAL_TABLET | ORAL | 3 refills | Status: DC

## 2018-04-26 MED ORDER — ALLOPURINOL 300 MG PO TABS: 300.0000 mg | ORAL_TABLET | Freq: Every day | ORAL | 3 refills | Status: DC

## 2018-04-26 MED ORDER — GLIPIZIDE ER 10 MG PO TB24: 10.0000 mg | ORAL_TABLET | Freq: Every day | ORAL | 3 refills | Status: DC

## 2018-04-26 MED ORDER — ZOLPIDEM TARTRATE 10 MG PO TABS: 10.0000 mg | ORAL_TABLET | Freq: Every evening | ORAL | 2 refills | Status: DC | PRN

## 2018-04-26 MED ORDER — SILDENAFIL CITRATE 100 MG PO TABS: 50.0000 mg | ORAL_TABLET | Freq: Every day | ORAL | 11 refills | Status: DC | PRN

## 2018-04-26 MED ORDER — OMEPRAZOLE 20 MG PO CPDR: 20.0000 mg | DELAYED_RELEASE_CAPSULE | Freq: Two times a day (BID) | ORAL | 3 refills | Status: DC

## 2018-04-26 NOTE — Patient Instructions (Signed)
Please continue all other medications as before, and refills have been done if requested.  Please have the pharmacy call with any other refills you may need.  Please continue your efforts at being more active, low cholesterol diet, and weight control.  You are otherwise up to date with prevention measures today.  Please keep your appointments with your specialists as you may have planned  Please go to the XRAY Department in the Basement (go straight as you get off the elevator) for the x-ray testing  You will be contacted by phone if any changes need to be made immediately.  Otherwise, you will receive a letter about your results with an explanation, but please check with MyChart first.  Please remember to sign up for MyChart if you have not done so, as this will be important to you in the future with finding out test results, communicating by private email, and scheduling acute appointments online when needed.  Please return in 6 months, or sooner if needed, with Lab testing done 3-5 days before

## 2018-04-26 NOTE — Assessment & Plan Note (Signed)

## 2018-04-26 NOTE — Progress Notes (Signed)
Subjective:    Patient ID: Troy Parsons, male    DOB: 1960/11/30, 58 y.o.   MRN: [id number]  HPI  Here for wellness and f/u;  Overall doing ok;  Pt denies worsening SOB, DOE, wheezing, orthopnea, PND, worsening LE edema, palpitations, dizziness or syncope, though has intermittent left upper chest pain and soreness, may be some better with shoulder and arm movement, mild, intermittent, without radiation or other associated symptoms, x 2 days.  Pt denies neurological change such as new headache, facial or extremity weakness.  Pt denies polydipsia, polyuria, or low sugar symptoms. Pt states overall good compliance with treatment and medications, good tolerability, and has been trying to follow appropriate diet.  Pt denies worsening depressive symptoms, suicidal ideation or panic. No fever, night sweats, wt loss, loss of appetite, or other constitutional symptoms.  Pt states good ability with ADL's, has low fall risk, home safety reviewed and adequate, no other significant changes in hearing or vision, and only occasionally active with exercise. Lost 5 lbs with better diet recently, trying to be low carb.   Wt Readings from Last 3 Encounters:  04/26/18 227 lb (103 kg)  10/26/17 233 lb (105.7 kg)  04/16/17 232 lb (105.2 kg)   Past Medical History:  Diagnosis Date  . Abnormal liver function   . Allergy   . Depression   . Esophageal reflux   . Fatty liver   . Gout   . Hemorrhoids   . Hiatal hernia   . Hyperlipemia   . Type II or unspecified type diabetes mellitus without mention of complication, not stated as uncontrolled    Past Surgical History:  Procedure Laterality Date  . APPENDECTOMY    . NASAL SEPTUM SURGERY  2010  . UMBILICAL HERNIA REPAIR  2009    reports that he has never smoked. He has never used smokeless tobacco. He reports current alcohol use. He reports that he does not use drugs. family history includes Cancer in an other family member; Colon cancer in his unknown  relative; Diabetes in his mother and paternal grandfather; Heart disease in an other family member; Hyperlipidemia in an other family member; Hypertension in an other family member; Stroke in an other family member. Allergies  Allergen Reactions  . Lipitor [Atorvastatin]    Current Outpatient Medications on File Prior to Visit  Medication Sig Dispense Refill  . Continuous Blood Gluc Receiver (FREESTYLE LIBRE 14 DAY READER) DEVI Apply 1 Device topically 4 (four) times daily as needed. E11.9 1 Device 0  . Continuous Blood Gluc Sensor (FREESTYLE LIBRE 14 DAY SENSOR) MISC Apply 1 Device topically every 14 (fourteen) days. E11.9 6 each 3  . cyclobenzaprine (FLEXERIL) 5 MG tablet Take 1 tablet (5 mg total) by mouth 3 (three) times daily as needed for muscle spasms. 40 tablet 1  . glucose blood (ACCU-CHEK AVIVA PLUS) test strip Use as instructed 100 each 0  . ibuprofen (ADVIL,MOTRIN) 800 MG tablet TAKE 1 TABLET(800 MG) BY MOUTH TWICE DAILY AS NEEDED 60 tablet 0   No current facility-administered medications on file prior to visit.    Review of Systems Constitutional: Negative for other unusual diaphoresis, sweats, appetite or weight changes HENT: Negative for other worsening hearing loss, ear pain, facial swelling, mouth sores or neck stiffness.   Eyes: Negative for other worsening pain, redness or other visual disturbance.  Respiratory: Negative for other stridor or swelling Cardiovascular: Negative for other palpitations or other chest pain  Gastrointestinal: Negative for worsening diarrhea or loose  stools, blood in stool, distention or other pain Genitourinary: Negative for hematuria, flank pain or other change in urine volume.  Musculoskeletal: Negative for myalgias or other joint swelling.  Skin: Negative for other color change, or other wound or worsening drainage.  Neurological: Negative for other syncope or numbness. Hematological: Negative for other adenopathy or swelling  Psychiatric/Behavioral: Negative for hallucinations, other worsening agitation, SI, self-injury, or new decreased concentration ALl other system neg per pt    Objective:   Physical Exam BP 122/78   Pulse 84   Temp 97.8 F (36.6 C) (Oral)   Ht 5\' 9"  (1.753 m)   Wt 227 lb (103 kg)   SpO2 96%   BMI 33.52 kg/m  VS noted,  Constitutional: Pt is oriented to person, place, and time. Appears well-developed and well-nourished, in no significant distress and comfortable Head: Normocephalic and atraumatic  Eyes: Conjunctivae and EOM are normal. Pupils are equal, round, and reactive to light Right Ear: External ear normal without discharge Left Ear: External ear normal without discharge Nose: Nose without discharge or deformity Mouth/Throat: Oropharynx is without other ulcerations and moist  Neck: Normal range of motion. Neck supple. No JVD present. No tracheal deviation present or significant neck LA or mass Cardiovascular: Normal rate, regular rhythm, normal heart sounds and intact distal pulses.   Pulmonary/Chest: WOB normal and breath sounds without rales or wheezing  Abdominal: Soft. Bowel sounds are normal. NT. No HSM  Musculoskeletal: Normal range of motion. Exhibits no edema Lymphadenopathy: Has no other cervical adenopathy.  Neurological: Pt is alert and oriented to person, place, and time. Pt has normal reflexes. No cranial nerve deficit. Motor grossly intact, Gait intact Skin: Skin is warm and dry. No rash noted or new ulcerations Psychiatric:  Has normal mood and affect. Behavior is normal without agitation No other exam findings Lab Results  Component Value Date   WBC 5.9 04/21/2018   HGB 15.2 04/21/2018   HCT 44.5 04/21/2018   PLT 233.0 04/21/2018   GLUCOSE 150 (H) 04/21/2018   CHOL 158 04/21/2018   TRIG 168.0 (H) 04/21/2018   HDL 48.20 04/21/2018   LDLDIRECT 66.0 10/20/2017   LDLCALC 76 04/21/2018   ALT 60 (H) 04/21/2018   AST 35 04/21/2018   NA 136 04/21/2018   K  4.7 04/21/2018   CL 100 04/21/2018   CREATININE 1.04 04/21/2018   BUN 24 (H) 04/21/2018   CO2 24 04/21/2018   TSH 3.53 04/21/2018   PSA 0.69 04/21/2018   INR 1.0 03/29/2010   HGBA1C 6.9 (H) 04/21/2018   MICROALBUR 2.0 (H) 04/21/2018       Assessment & Plan:

## 2018-04-26 NOTE — Assessment & Plan Note (Signed)
stable overall by history and exam, recent data reviewed with pt, and pt to continue medical treatment as before,  to f/u any worsening symptoms or concerns  

## 2018-04-26 NOTE — Assessment & Plan Note (Addendum)
Atypical, for cxr, declines ecg, suspect probable MSK but cant r/o other

## 2018-05-12 ENCOUNTER — Telehealth: Payer: Self-pay | Admitting: Podiatry

## 2018-05-12 NOTE — Telephone Encounter (Signed)
Patient has appointment on 4/6 for an ingrown toenail. He asked if an antibiotic could be called in so he can start it prior to his appointment.

## 2018-05-12 NOTE — Telephone Encounter (Signed)
I called pt states he had an ingrown in December and began to flare yesterday, and trimmed as much as he could out but it is still sore and he applied an antibiotic cream. I asked pt for other symptoms and he said at this point it was just sore. I instructed pt to begin epsom salt 1/4 cup to 1 qt warm water 20 minutes twice a day and cover with an antibiotic bandaid, and I would inform Dr. Jacqualyn Posey of his request.

## 2018-05-13 ENCOUNTER — Ambulatory Visit (INDEPENDENT_AMBULATORY_CARE_PROVIDER_SITE_OTHER): Payer: 59 | Admitting: Podiatry

## 2018-05-13 ENCOUNTER — Other Ambulatory Visit: Payer: Self-pay

## 2018-05-13 VITALS — BP 162/98 | Temp 97.9°F

## 2018-05-13 DIAGNOSIS — L600 Ingrowing nail: Secondary | ICD-10-CM

## 2018-05-13 MED ORDER — CEPHALEXIN 500 MG PO CAPS: 500.0000 mg | ORAL_CAPSULE | Freq: Three times a day (TID) | ORAL | 0 refills | Status: DC

## 2018-05-13 NOTE — Telephone Encounter (Signed)
If he wants to come in to be seen I am here and willing to see him.

## 2018-05-13 NOTE — Telephone Encounter (Signed)
Troy Parsons - scheduler was able to contact pt to get scheduled.

## 2018-05-13 NOTE — Telephone Encounter (Signed)
Left message informing pt Dr. Jacqualyn Posey would be able to see him today and to call [phone] to schedule.

## 2018-05-13 NOTE — Patient Instructions (Signed)

## 2018-05-17 ENCOUNTER — Ambulatory Visit: Payer: 59 | Admitting: Podiatry

## 2018-05-17 NOTE — Progress Notes (Signed)
Subjective: Mr. Crihfield presents to the office today for concerns of a recurrent ingrown toenail to the right lateral hallux toenail.  He states he has been doing more walking recently he thinks this may have aggravated.  Denies any drainage or pus.  He did try to cut it out himself. Denies any systemic complaints such as fevers, chills, nausea, vomiting. No acute changes since last appointment, and no other complaints at this time.   Objective: AAO x3, NAD DP/PT pulses palpable bilaterally, CRT less than 3 seconds There is incurvation along the right lateral hallux toenail with localized edema and erythema there is no drainage or pus there is no ascending cellulitis.  There is no fluctuation crepitation any malodor. No open lesions or pre-ulcerative lesions.  No pain with calf compression, swelling, warmth, erythema  Assessment: Ingrown toenail right lateral hallux with localized erythema  Plan: -All treatment options discussed with the patient including all alternatives, risks, complications.  -Did discuss partial nail avulsion but he wants to hold off on this.  Did debride the toenail with any complications or bleeding.  Recommend to start Epson salt soaks as well as antibiotic ointment and a bandage daily.  Prescribed Keflex. -Monitor for any clinical signs or symptoms of infection and directed to call the office immediately should any occur or go to the ER. -Patient encouraged to call the office with any questions, concerns, change in symptoms.   Trula Slade DPM

## 2018-05-24 ENCOUNTER — Other Ambulatory Visit: Payer: Self-pay | Admitting: Internal Medicine

## 2018-05-27 ENCOUNTER — Ambulatory Visit: Payer: 59 | Admitting: Podiatry

## 2018-06-15 ENCOUNTER — Other Ambulatory Visit: Payer: Self-pay | Admitting: Internal Medicine

## 2018-06-15 MED ORDER — IBUPROFEN 800 MG PO TABS: ORAL_TABLET | ORAL | 2 refills | Status: DC

## 2018-08-23 ENCOUNTER — Encounter: Payer: Self-pay | Admitting: Internal Medicine

## 2018-08-23 ENCOUNTER — Telehealth: Payer: Self-pay | Admitting: *Deleted

## 2018-08-23 DIAGNOSIS — Z20822 Contact with and (suspected) exposure to covid-19: Secondary | ICD-10-CM

## 2018-08-23 NOTE — Telephone Encounter (Signed)
-----   Message from Biagio Borg, MD sent at 08/23/2018  3:06 PM EDT ----- Regarding: covid testing Please arrange covid testing for exposed person

## 2018-08-23 NOTE — Telephone Encounter (Signed)
Spoke with patient, per his request, scheduled him for COVID 19 test on 08/25/2018 at 10 am.  Testing protocol reviewed with patient.

## 2018-08-25 ENCOUNTER — Other Ambulatory Visit: Payer: 59

## 2018-08-25 DIAGNOSIS — Z20822 Contact with and (suspected) exposure to covid-19: Secondary | ICD-10-CM

## 2018-08-29 LAB — NOVEL CORONAVIRUS, NAA: SARS-CoV-2, NAA: NOT DETECTED

## 2018-09-13 ENCOUNTER — Other Ambulatory Visit: Payer: Self-pay

## 2018-09-13 MED ORDER — IBUPROFEN 800 MG PO TABS: ORAL_TABLET | ORAL | 2 refills | Status: DC

## 2018-10-06 ENCOUNTER — Other Ambulatory Visit: Payer: Self-pay | Admitting: Internal Medicine

## 2018-10-21 ENCOUNTER — Other Ambulatory Visit (INDEPENDENT_AMBULATORY_CARE_PROVIDER_SITE_OTHER): Payer: 59

## 2018-10-21 DIAGNOSIS — E119 Type 2 diabetes mellitus without complications: Secondary | ICD-10-CM

## 2018-10-21 LAB — BASIC METABOLIC PANEL
BUN: 18 mg/dL (ref 6–23)
CO2: 28 mEq/L (ref 19–32)
Calcium: 9.5 mg/dL (ref 8.4–10.5)
Chloride: 102 mEq/L (ref 96–112)
Creatinine, Ser: 0.9 mg/dL (ref 0.40–1.50)
GFR: 87.22 mL/min (ref >60.00)
Glucose, Bld: 164 mg/dL — ABNORMAL HIGH (ref 70–99)
Potassium: 4.5 mEq/L (ref 3.5–5.1)
Sodium: 137 mEq/L (ref 135–145)

## 2018-10-21 LAB — LIPID PANEL
Cholesterol: 127 mg/dL (ref 0–200)
HDL: 36.2 mg/dL — ABNORMAL LOW (ref >39.00)
NonHDL: 91.12
Total CHOL/HDL Ratio: 4
Triglycerides: 239 mg/dL — ABNORMAL HIGH (ref 0.0–149.0)
VLDL: 47.8 mg/dL — ABNORMAL HIGH (ref 0.0–40.0)

## 2018-10-21 LAB — HEPATIC FUNCTION PANEL
ALT: 58 U/L — ABNORMAL HIGH (ref 0–53)
AST: 36 U/L (ref 0–37)
Albumin: 4.3 g/dL (ref 3.5–5.2)
Alkaline Phosphatase: 48 U/L (ref 39–117)
Bilirubin, Direct: 0.1 mg/dL (ref 0.0–0.3)
Total Bilirubin: 0.4 mg/dL (ref 0.2–1.2)
Total Protein: 6.7 g/dL (ref 6.0–8.3)

## 2018-10-21 LAB — LDL CHOLESTEROL, DIRECT: Direct LDL: 66 mg/dL

## 2018-10-21 LAB — HEMOGLOBIN A1C: Hgb A1c MFr Bld: 7.5 % — ABNORMAL HIGH (ref 4.6–6.5)

## 2018-10-27 ENCOUNTER — Ambulatory Visit (INDEPENDENT_AMBULATORY_CARE_PROVIDER_SITE_OTHER): Payer: 59 | Admitting: Internal Medicine

## 2018-10-27 ENCOUNTER — Encounter: Payer: Self-pay | Admitting: Internal Medicine

## 2018-10-27 ENCOUNTER — Other Ambulatory Visit: Payer: Self-pay

## 2018-10-27 VITALS — BP 142/92 | HR 65 | Temp 98.3°F | Ht 69.0 in | Wt 232.0 lb

## 2018-10-27 DIAGNOSIS — E119 Type 2 diabetes mellitus without complications: Secondary | ICD-10-CM | POA: Diagnosis not present

## 2018-10-27 DIAGNOSIS — E611 Iron deficiency: Secondary | ICD-10-CM

## 2018-10-27 DIAGNOSIS — Z23 Encounter for immunization: Secondary | ICD-10-CM | POA: Diagnosis not present

## 2018-10-27 DIAGNOSIS — E559 Vitamin D deficiency, unspecified: Secondary | ICD-10-CM

## 2018-10-27 DIAGNOSIS — E785 Hyperlipidemia, unspecified: Secondary | ICD-10-CM

## 2018-10-27 DIAGNOSIS — Z0000 Encounter for general adult medical examination without abnormal findings: Secondary | ICD-10-CM

## 2018-10-27 DIAGNOSIS — I10 Essential (primary) hypertension: Secondary | ICD-10-CM

## 2018-10-27 DIAGNOSIS — E538 Deficiency of other specified B group vitamins: Secondary | ICD-10-CM

## 2018-10-27 MED ORDER — IRBESARTAN 300 MG PO TABS: 300.0000 mg | ORAL_TABLET | Freq: Every day | ORAL | 3 refills | Status: DC

## 2018-10-27 MED ORDER — LEVOTHYROXINE SODIUM 25 MCG PO TABS: ORAL_TABLET | ORAL | 3 refills | Status: DC

## 2018-10-27 NOTE — Patient Instructions (Addendum)
You had the flu shot today  OK to increase the avapro to 300 mg per day  Please continue all other medications as before, and refills have been done if requested.  Please have the pharmacy call with any other refills you may need.  Please continue your efforts at being more active, low cholesterol diet, and weight control.  You are otherwise up to date with prevention measures today.  Please keep your appointments with your specialists as you may have planned  .Please return in 6 months, or sooner if needed, with Lab testing done 3-5 days before

## 2018-10-27 NOTE — Progress Notes (Signed)
Subjective:    Patient ID: Troy Parsons, male    DOB: 09-27-1960, 58 y.o.   MRN: [id number]  HPI  Here to f/u; overall doing ok,  Pt denies chest pain, increasing sob or doe, wheezing, orthopnea, PND, increased LE swelling, palpitations, dizziness or syncope.  Pt denies new neurological symptoms such as new headache, or facial or extremity weakness or numbness.  Pt denies polydipsia, polyuria, or low sugar episode.  Pt states overall good compliance with meds, mostly trying to follow appropriate diet, with wt overall stable,  but little exercise however. Diet may not be as good recently during the pandemic.  Wt Readings from Last 3 Encounters:  10/27/18 232 lb (105.2 kg)  04/26/18 227 lb (103 kg)  10/26/17 233 lb (105.7 kg)   BP Readings from Last 3 Encounters:  10/27/18 (!) 142/92  05/13/18 (!) 162/98  04/26/18 122/78  C/o 3 days lower back pain with more lifting recently with some sciatica to right upper leg, manage with motrin and lidocaine ptach., overall better.   Past Medical History:  Diagnosis Date  . Abnormal liver function   . Allergy   . Depression   . Esophageal reflux   . Fatty liver   . Gout   . Hemorrhoids   . Hiatal hernia   . Hyperlipemia   . Type II or unspecified type diabetes mellitus without mention of complication, not stated as uncontrolled    Past Surgical History:  Procedure Laterality Date  . APPENDECTOMY    . NASAL SEPTUM SURGERY  2010  . UMBILICAL HERNIA REPAIR  2009    reports that he has never smoked. He has never used smokeless tobacco. He reports current alcohol use. He reports that he does not use drugs. family history includes Cancer in an other family member; Colon cancer in his unknown relative; Diabetes in his mother and paternal grandfather; Heart disease in an other family member; Hyperlipidemia in an other family member; Hypertension in an other family member; Stroke in an other family member. Allergies  Allergen Reactions  . Lipitor  [Atorvastatin]    Current Outpatient Medications on File Prior to Visit  Medication Sig Dispense Refill  . allopurinol (ZYLOPRIM) 300 MG tablet Take 1 tablet (300 mg total) by mouth daily. 90 tablet 3  . cephALEXin (KEFLEX) 500 MG capsule Take 1 capsule (500 mg total) by mouth 3 (three) times daily. 21 capsule 0  . Continuous Blood Gluc Receiver (FREESTYLE LIBRE 14 DAY READER) DEVI Apply 1 Device topically 4 (four) times daily as needed. E11.9 1 Device 0  . Continuous Blood Gluc Sensor (FREESTYLE LIBRE 14 DAY SENSOR) MISC Apply 1 Device topically every 14 (fourteen) days. E11.9 6 each 3  . cyclobenzaprine (FLEXERIL) 5 MG tablet Take 1 tablet (5 mg total) by mouth 3 (three) times daily as needed for muscle spasms. 40 tablet 1  . glipiZIDE (GLUCOTROL XL) 10 MG 24 hr tablet TAKE 1 TABLET(10 MG) BY MOUTH DAILY WITH BREAKFAST 90 tablet 1  . glipiZIDE (GLUCOTROL XL) 5 MG 24 hr tablet TAKE 1 TABLET(5 MG) BY MOUTH DAILY WITH BREAKFAST 90 tablet 1  . glucose blood (ACCU-CHEK AVIVA PLUS) test strip Use as instructed 100 each 0  . ibuprofen (ADVIL) 800 MG tablet TAKE 1 TABLET(800 MG) BY MOUTH TWICE DAILY AS NEEDED 60 tablet 2  . metFORMIN (GLUCOPHAGE-XR) 500 MG 24 hr tablet 4 tabs by mouth in the AM 360 tablet 3  . omeprazole (PRILOSEC) 20 MG capsule TAKE 1 CAPSULE  BY MOUTH TWICE DAILY 180 capsule 3  . sildenafil (VIAGRA) 100 MG tablet Take 0.5-1 tablets (50-100 mg total) by mouth daily as needed for erectile dysfunction. 5 tablet 11  . simvastatin (ZOCOR) 40 MG tablet Take 1 tablet (40 mg total) by mouth at bedtime. 90 tablet 3  . zolpidem (AMBIEN) 10 MG tablet Take 1 tablet (10 mg total) by mouth at bedtime as needed for sleep. 30 tablet 2   No current facility-administered medications on file prior to visit.    Review of Systems  Constitutional: Negative for other unusual diaphoresis or sweats HENT: Negative for ear discharge or swelling Eyes: Negative for other worsening visual disturbances  Respiratory: Negative for stridor or other swelling  Gastrointestinal: Negative for worsening distension or other blood Genitourinary: Negative for retention or other urinary change Musculoskeletal: Negative for other MSK pain or swelling Skin: Negative for color change or other new lesions Neurological: Negative for worsening tremors and other numbness  Psychiatric/Behavioral: Negative for worsening agitation or other fatigue All other system neg per pt    Objective:   Physical Exam BP (!) 142/92   Pulse 65   Temp 98.3 F (36.8 C) (Oral)   Ht 5\' 9"  (1.753 m)   Wt 232 lb (105.2 kg)   SpO2 97%   BMI 34.26 kg/m  VS noted,  Constitutional: Pt appears in NAD HENT: Head: NCAT.  Right Ear: External ear normal.  Left Ear: External ear normal.  Eyes: . Pupils are equal, round, and reactive to light. Conjunctivae and EOM are normal Nose: without d/c or deformity Neck: Neck supple. Gross normal ROM Cardiovascular: Normal rate and regular rhythm.   Pulmonary/Chest: Effort normal and breath sounds without rales or wheezing.  Abd:  Soft, NT, ND, + BS, no organomegaly Neurological: Pt is alert. At baseline orientation, motor grossly intact Skin: Skin is warm. No rashes, other new lesions, no LE edema Psychiatric: Pt behavior is normal without agitation  All otherwise neg per pt Lab Results  Component Value Date   WBC 5.9 04/21/2018   HGB 15.2 04/21/2018   HCT 44.5 04/21/2018   PLT 233.0 04/21/2018   GLUCOSE 164 (H) 10/21/2018   CHOL 127 10/21/2018   TRIG 239.0 (H) 10/21/2018   HDL 36.20 (L) 10/21/2018   LDLDIRECT 66.0 10/21/2018   LDLCALC 76 04/21/2018   ALT 58 (H) 10/21/2018   AST 36 10/21/2018   NA 137 10/21/2018   K 4.5 10/21/2018   CL 102 10/21/2018   CREATININE 0.90 10/21/2018   BUN 18 10/21/2018   CO2 28 10/21/2018   TSH 3.53 04/21/2018   PSA 0.69 04/21/2018   INR 1.0 03/29/2010   HGBA1C 7.5 (H) 10/21/2018   MICROALBUR 2.0 (H) 04/21/2018          Assessment  & Plan:

## 2018-10-28 ENCOUNTER — Encounter: Payer: Self-pay | Admitting: Internal Medicine

## 2018-10-28 NOTE — Assessment & Plan Note (Signed)
Mild uncontrolled, pt encouraged to take all 4 pills per day since only has been taking mostly 3

## 2018-10-28 NOTE — Assessment & Plan Note (Signed)
Uncontrolled, for increased avapro 300 qd, o/w stable overall by history and exam, recent data reviewed with pt, and pt to continue medical treatment as before,  to f/u any worsening symptoms or concerns

## 2018-10-28 NOTE — Assessment & Plan Note (Signed)
stable overall by history and exam, recent data reviewed with pt, and pt to continue medical treatment as before,  to f/u any worsening symptoms or concerns  

## 2018-10-29 ENCOUNTER — Ambulatory Visit (INDEPENDENT_AMBULATORY_CARE_PROVIDER_SITE_OTHER)
Admission: RE | Admit: 2018-10-29 | Discharge: 2018-10-29 | Disposition: A | Discharge status: Home / Self Care | Payer: 59 | Source: Ambulatory Visit

## 2018-10-29 ENCOUNTER — Inpatient Hospital Stay: Admission: RE | Admit: 2018-10-29 | Payer: 59 | Source: Ambulatory Visit

## 2018-10-29 ENCOUNTER — Other Ambulatory Visit: Payer: Self-pay

## 2018-10-29 DIAGNOSIS — M5441 Lumbago with sciatica, right side: Secondary | ICD-10-CM

## 2018-10-29 DIAGNOSIS — M5442 Lumbago with sciatica, left side: Secondary | ICD-10-CM

## 2018-10-29 DIAGNOSIS — M62830 Muscle spasm of back: Secondary | ICD-10-CM

## 2018-10-29 MED ORDER — PREDNISONE 20 MG PO TABS: 20.0000 mg | ORAL_TABLET | Freq: Two times a day (BID) | ORAL | 0 refills | Status: AC

## 2018-10-29 MED ORDER — CYCLOBENZAPRINE HCL 10 MG PO TABS: 10.0000 mg | ORAL_TABLET | Freq: Two times a day (BID) | ORAL | 0 refills | Status: DC | PRN

## 2018-10-29 NOTE — ED Provider Notes (Signed)
[place] Virtual Visit via Video Note:  Troy Parsons  initiated request for Telemedicine visit with [facility] team. I connected with Emelda Fear  on 10/29/2018 at 12:07 PM  for a synchronized telemedicine visit using a video enabled HIPPA compliant telemedicine application. I verified that I am speaking with Emelda Fear  using two identifiers. Lestine Box, PA-C  was physically located in a [facility] Urgent care site and JUAN DORSAINVIL was located at a different location.   The limitations of evaluation and management by telemedicine as well as the availability of in-person appointments were discussed. Patient was informed that he  may incur a bill ( including co-pay) for this virtual visit encounter. Jasmine Awe Roskelley  expressed understanding and gave verbal consent to proceed with virtual visit.   [id number] 10/29/18 Arrival Time: [id number]  CC: Back pain  SUBJECTIVE: History from: patient. Troy Parsons is a 58 y.o. male complains of low back pain that began 5 days ago.  Denies a precipitating event or specific injury. States symptoms began after he "turned wrong."  Localizes the pain to the bilateral low back.  Describes the pain as constant and sharp in character.  Complains of intermittent numbness and tingling in bilateral lower extremities.  Has tried OTC medications, heat, and lidocaine patches without relief.  Symptoms are made worse with sitting, mildly improved with standing.  Reports similar symptoms in the past that improved with steroid.  Denies fever, chills, N/V,  chest pain, SOB, weakness, saddle paresthesias, urinary symptoms, loss of bowel or bladder function.      ROS: As per HPI.  All other pertinent ROS negative.     Past Medical History:  Diagnosis Date  . Abnormal liver function   . Allergy   . Depression   . Esophageal reflux   . Fatty liver   . Gout   . Hemorrhoids   . Hiatal hernia   . Hyperlipemia   . Type II or  unspecified type diabetes mellitus without mention of complication, not stated as uncontrolled    Past Surgical History:  Procedure Laterality Date  . APPENDECTOMY    . NASAL SEPTUM SURGERY  2010  . UMBILICAL HERNIA REPAIR  2009   Allergies  Allergen Reactions  . Lipitor [Atorvastatin]    No current facility-administered medications on file prior to encounter.    Current Outpatient Medications on File Prior to Encounter  Medication Sig Dispense Refill  . allopurinol (ZYLOPRIM) 300 MG tablet Take 1 tablet (300 mg total) by mouth daily. 90 tablet 3  . Continuous Blood Gluc Receiver (FREESTYLE LIBRE 14 DAY READER) DEVI Apply 1 Device topically 4 (four) times daily as needed. E11.9 1 Device 0  . Continuous Blood Gluc Sensor (FREESTYLE LIBRE 14 DAY SENSOR) MISC Apply 1 Device topically every 14 (fourteen) days. E11.9 6 each 3  . glipiZIDE (GLUCOTROL XL) 10 MG 24 hr tablet TAKE 1 TABLET(10 MG) BY MOUTH DAILY WITH BREAKFAST 90 tablet 1  . glipiZIDE (GLUCOTROL XL) 5 MG 24 hr tablet TAKE 1 TABLET(5 MG) BY MOUTH DAILY WITH BREAKFAST 90 tablet 1  . glucose blood (ACCU-CHEK AVIVA PLUS) test strip Use as instructed 100 each 0  . ibuprofen (ADVIL) 800 MG tablet TAKE 1 TABLET(800 MG) BY MOUTH TWICE DAILY AS NEEDED 60 tablet 2  . irbesartan (AVAPRO) 300 MG tablet Take 1 tablet (300 mg total) by mouth daily. 90 tablet 3  . levothyroxine (SYNTHROID) 25 MCG tablet TAKE 1  TABLET(25 MCG) BY MOUTH DAILY BEFORE BREAKFAST 90 tablet 3  . metFORMIN (GLUCOPHAGE-XR) 500 MG 24 hr tablet 4 tabs by mouth in the AM 360 tablet 3  . omeprazole (PRILOSEC) 20 MG capsule TAKE 1 CAPSULE BY MOUTH TWICE DAILY 180 capsule 3  . sildenafil (VIAGRA) 100 MG tablet Take 0.5-1 tablets (50-100 mg total) by mouth daily as needed for erectile dysfunction. 5 tablet 11  . simvastatin (ZOCOR) 40 MG tablet Take 1 tablet (40 mg total) by mouth at bedtime. 90 tablet 3  . zolpidem (AMBIEN) 10 MG tablet Take 1 tablet (10 mg total) by mouth at  bedtime as needed for sleep. 30 tablet 2    OBJECTIVE:  There were no vitals filed for this visit.  General appearance: alert; no distress Eyes: EOMI grossly HENT: normocephalic; atraumatic Neck: supple with FROM Lungs: normal respiratory effort; speaking in full sentences without difficulty Extremities: moves extremities without difficulty Skin: No obvious rashes Neurologic: No facial asymmetries Psychological: alert and cooperative; normal mood and affect   ASSESSMENT & PLAN:  1. Acute bilateral low back pain with bilateral sciatica   2. Back spasm     Meds ordered this encounter  Medications  . predniSONE (DELTASONE) 20 MG tablet    Sig: Take 1 tablet (20 mg total) by mouth 2 (two) times daily with a meal for 5 days.    Dispense:  10 tablet    Refill:  0    Order Specific Question:   Supervising Provider    Answer:   Raylene Everts [id number]  . cyclobenzaprine (FLEXERIL) 10 MG tablet    Sig: Take 1 tablet (10 mg total) by mouth 3 times/day as needed-between meals & bedtime for muscle spasms.    Dispense:  12 tablet    Refill:  0    Order Specific Question:   Supervising Provider    Answer:   Raylene Everts [id number]    Continue conservative management of rest, ice, heat, and gentle stretches/ massage Prednisone prescribed.  Take as directed and to completion Take cyclobenzaprine at nighttime for symptomatic relief. Avoid driving or operating heavy machinery while using medication. Follow up in person or with PCP if symptoms persist Follow up in person or go to the ER if you have any new or worsening symptoms (fever, chills, chest pain, abdominal pain, changes in bowel or bladder habits, pain radiating into lower legs, etc...)    I discussed the assessment and treatment plan with the patient. The patient was provided an opportunity to ask questions and all were answered. The patient agreed with the plan and demonstrated an understanding of the instructions.    The patient was advised to call back or seek an in-person evaluation if the symptoms worsen or if the condition fails to improve as anticipated.  I provided 11 minutes of non-face-to-face time during this encounter.  Lestine Box, PA-C  10/29/2018 12:07 PM     Lestine Box, PA-C 10/29/18 1210

## 2018-10-29 NOTE — Discharge Instructions (Signed)
Continue conservative management of rest, ice, heat, and gentle stretches/ massage Prednisone prescribed.  Take as directed and to completion Take cyclobenzaprine at nighttime for symptomatic relief. Avoid driving or operating heavy machinery while using medication. Follow up in person or with PCP if symptoms persist Follow up in person or go to the ER if you have any new or worsening symptoms (fever, chills, chest pain, abdominal pain, changes in bowel or bladder habits, pain radiating into lower legs, etc...)

## 2018-11-16 ENCOUNTER — Other Ambulatory Visit: Payer: Self-pay

## 2018-11-16 DIAGNOSIS — Z20822 Contact with and (suspected) exposure to covid-19: Secondary | ICD-10-CM

## 2018-11-16 NOTE — Progress Notes (Signed)
[id number]

## 2018-11-19 LAB — NOVEL CORONAVIRUS, NAA: SARS-CoV-2, NAA: NOT DETECTED

## 2018-11-24 ENCOUNTER — Telehealth: Payer: Self-pay | Admitting: Internal Medicine

## 2018-11-24 MED ORDER — ZOLPIDEM TARTRATE 10 MG PO TABS: 10.0000 mg | ORAL_TABLET | Freq: Every evening | ORAL | 5 refills | Status: DC | PRN

## 2018-11-24 NOTE — Telephone Encounter (Signed)
Done erx 

## 2018-12-14 ENCOUNTER — Encounter: Payer: Self-pay | Admitting: Internal Medicine

## 2018-12-14 MED ORDER — FREESTYLE LIBRE 14 DAY SENSOR MISC: 1.0000 | 3 refills | Status: DC

## 2019-01-19 ENCOUNTER — Encounter: Payer: Self-pay | Admitting: Internal Medicine

## 2019-02-14 ENCOUNTER — Encounter: Payer: Self-pay | Admitting: Internal Medicine

## 2019-02-14 MED ORDER — IBUPROFEN 800 MG PO TABS: ORAL_TABLET | ORAL | 2 refills | Status: DC

## 2019-04-07 LAB — NOVEL CORONAVIRUS, NAA: SARS-CoV-2, NAA: NOT DETECTED

## 2019-04-08 ENCOUNTER — Other Ambulatory Visit: Payer: Self-pay | Admitting: Internal Medicine

## 2019-04-14 ENCOUNTER — Other Ambulatory Visit: Payer: Self-pay | Admitting: Internal Medicine

## 2019-04-22 ENCOUNTER — Other Ambulatory Visit (INDEPENDENT_AMBULATORY_CARE_PROVIDER_SITE_OTHER): Payer: 59

## 2019-04-22 DIAGNOSIS — E611 Iron deficiency: Secondary | ICD-10-CM | POA: Diagnosis not present

## 2019-04-22 DIAGNOSIS — E538 Deficiency of other specified B group vitamins: Secondary | ICD-10-CM

## 2019-04-22 DIAGNOSIS — E559 Vitamin D deficiency, unspecified: Secondary | ICD-10-CM | POA: Diagnosis not present

## 2019-04-22 DIAGNOSIS — Z0000 Encounter for general adult medical examination without abnormal findings: Secondary | ICD-10-CM | POA: Diagnosis not present

## 2019-04-22 DIAGNOSIS — E119 Type 2 diabetes mellitus without complications: Secondary | ICD-10-CM

## 2019-04-22 LAB — IBC PANEL
Iron: 150 ug/dL (ref 42–165)
Saturation Ratios: 40.7 % (ref 20.0–50.0)
Transferrin: 263 mg/dL (ref 212.0–360.0)

## 2019-04-22 LAB — HEMOGLOBIN A1C: Hgb A1c MFr Bld: 6.5 % (ref 4.6–6.5)

## 2019-04-22 LAB — CBC WITH DIFFERENTIAL/PLATELET
Basophils Absolute: 0 10*3/uL (ref 0.0–0.1)
Basophils Relative: 0.8 % (ref 0.0–3.0)
Eosinophils Absolute: 0.1 10*3/uL (ref 0.0–0.7)
Eosinophils Relative: 2.6 % (ref 0.0–5.0)
HCT: 42.1 % (ref 39.0–52.0)
Hemoglobin: 14.3 g/dL (ref 13.0–17.0)
Lymphocytes Relative: 43.2 % (ref 12.0–46.0)
Lymphs Abs: 2.2 10*3/uL (ref 0.7–4.0)
MCHC: 33.9 g/dL (ref 30.0–36.0)
MCV: 90.2 fl (ref 78.0–100.0)
Monocytes Absolute: 0.5 10*3/uL (ref 0.1–1.0)
Monocytes Relative: 9.3 % (ref 3.0–12.0)
Neutro Abs: 2.3 10*3/uL (ref 1.4–7.7)
Neutrophils Relative %: 44.1 % (ref 43.0–77.0)
Platelets: 198 10*3/uL (ref 150.0–400.0)
RBC: 4.67 Mil/uL (ref 4.22–5.81)
RDW: 13.5 % (ref 11.5–15.5)
WBC: 5.1 10*3/uL (ref 4.0–10.5)

## 2019-04-22 LAB — URINALYSIS, ROUTINE W REFLEX MICROSCOPIC
Bilirubin Urine: NEGATIVE
Hgb urine dipstick: NEGATIVE
Leukocytes,Ua: NEGATIVE
Nitrite: NEGATIVE
Specific Gravity, Urine: 1.01 (ref 1.000–1.030)
Total Protein, Urine: NEGATIVE
Urine Glucose: NEGATIVE
Urobilinogen, UA: 1 (ref 0.0–1.0)
pH: 7 (ref 5.0–8.0)

## 2019-04-22 LAB — LIPID PANEL
Cholesterol: 100 mg/dL (ref 0–200)
HDL: 46 mg/dL (ref >39.00)
LDL Cholesterol: 18 mg/dL (ref 0–99)
NonHDL: 53.63
Total CHOL/HDL Ratio: 2
Triglycerides: 177 mg/dL — ABNORMAL HIGH (ref 0.0–149.0)
VLDL: 35.4 mg/dL (ref 0.0–40.0)

## 2019-04-22 LAB — BASIC METABOLIC PANEL
BUN: 12 mg/dL (ref 6–23)
CO2: 25 mEq/L (ref 19–32)
Calcium: 9.6 mg/dL (ref 8.4–10.5)
Chloride: 102 mEq/L (ref 96–112)
Creatinine, Ser: 0.8 mg/dL (ref 0.40–1.50)
GFR: 99.74 mL/min (ref >60.00)
Glucose, Bld: 114 mg/dL — ABNORMAL HIGH (ref 70–99)
Potassium: 3.9 mEq/L (ref 3.5–5.1)
Sodium: 138 mEq/L (ref 135–145)

## 2019-04-22 LAB — HEPATIC FUNCTION PANEL
ALT: 49 U/L (ref 0–53)
AST: 33 U/L (ref 0–37)
Albumin: 4.5 g/dL (ref 3.5–5.2)
Alkaline Phosphatase: 55 U/L (ref 39–117)
Bilirubin, Direct: 0.2 mg/dL (ref 0.0–0.3)
Total Bilirubin: 0.7 mg/dL (ref 0.2–1.2)
Total Protein: 6.6 g/dL (ref 6.0–8.3)

## 2019-04-22 LAB — TSH: TSH: 3.32 u[IU]/mL (ref 0.35–4.50)

## 2019-04-22 LAB — MICROALBUMIN / CREATININE URINE RATIO
Creatinine,U: 151.6 mg/dL
Microalb Creat Ratio: 0.5 mg/g (ref 0.0–30.0)
Microalb, Ur: <0.7 mg/dL (ref 0.0–1.9)

## 2019-04-22 LAB — PSA: PSA: 0.83 ng/mL (ref 0.10–4.00)

## 2019-04-22 LAB — VITAMIN B12: Vitamin B-12: 1180 pg/mL — ABNORMAL HIGH (ref 211–911)

## 2019-04-22 LAB — VITAMIN D 25 HYDROXY (VIT D DEFICIENCY, FRACTURES): VITD: 41.64 ng/mL (ref 30.00–100.00)

## 2019-04-27 ENCOUNTER — Ambulatory Visit (INDEPENDENT_AMBULATORY_CARE_PROVIDER_SITE_OTHER): Payer: 59 | Admitting: Internal Medicine

## 2019-04-27 ENCOUNTER — Encounter: Payer: Self-pay | Admitting: Internal Medicine

## 2019-04-27 ENCOUNTER — Other Ambulatory Visit: Payer: Self-pay

## 2019-04-27 VITALS — BP 126/82 | HR 53 | Temp 97.8°F | Ht 69.0 in | Wt 209.0 lb

## 2019-04-27 DIAGNOSIS — Z0000 Encounter for general adult medical examination without abnormal findings: Secondary | ICD-10-CM

## 2019-04-27 DIAGNOSIS — E119 Type 2 diabetes mellitus without complications: Secondary | ICD-10-CM

## 2019-04-27 DIAGNOSIS — E785 Hyperlipidemia, unspecified: Secondary | ICD-10-CM | POA: Diagnosis not present

## 2019-04-27 MED ORDER — SIMVASTATIN 20 MG PO TABS: 20.0000 mg | ORAL_TABLET | Freq: Every day | ORAL | 3 refills | Status: DC

## 2019-04-27 MED ORDER — GLIPIZIDE ER 5 MG PO TB24: ORAL_TABLET | ORAL | 3 refills | Status: DC

## 2019-04-27 NOTE — Progress Notes (Signed)
Subjective:    Patient ID: Troy Parsons, male    DOB: 16-Apr-1960, 59 y.o.   MRN: [id number]  HPI  Here for wellness and f/u;  Overall doing ok;  Pt denies Chest pain, worsening SOB, DOE, wheezing, orthopnea, PND, worsening LE edema, palpitations, dizziness or syncope.  Pt denies neurological change such as new headache, facial or extremity weakness.  Pt denies polydipsia, polyuria, or low sugar symptoms. Pt states overall good compliance with treatment and medications, good tolerability, and has been trying to follow appropriate diet.  Pt denies worsening depressive symptoms, suicidal ideation or panic. No fever, night sweats, wt loss, loss of appetite, or other constitutional symptoms.  Pt states good ability with ADL's, has low fall risk, home safety reviewed and adequate, no other significant changes in hearing or vision, and only occasionally active with exercise. Has appt for eye doctor tomorrow.CBGs have been very low 100s recently with wt loss and  Better diet Past Medical History:  Diagnosis Date  . Abnormal liver function   . Allergy   . Depression   . Esophageal reflux   . Fatty liver   . Gout   . Hemorrhoids   . Hiatal hernia   . Hyperlipemia   . Type II or unspecified type diabetes mellitus without mention of complication, not stated as uncontrolled    Past Surgical History:  Procedure Laterality Date  . APPENDECTOMY    . NASAL SEPTUM SURGERY  2010  . UMBILICAL HERNIA REPAIR  2009    reports that he has never smoked. He has never used smokeless tobacco. He reports current alcohol use. He reports that he does not use drugs. family history includes Cancer in an other family member; Colon cancer in an other family member; Diabetes in his mother and paternal grandfather; Heart disease in an other family member; Hyperlipidemia in an other family member; Hypertension in an other family member; Stroke in an other family member. Allergies  Allergen Reactions  . Lipitor  [Atorvastatin]    Current Outpatient Medications on File Prior to Visit  Medication Sig Dispense Refill  . allopurinol (ZYLOPRIM) 300 MG tablet Take 1 tablet (300 mg total) by mouth daily. Must keep Annual appt scheduled for 04/27/19 for future refills 30 tablet 0  . Continuous Blood Gluc Receiver (FREESTYLE LIBRE 14 DAY READER) DEVI Apply 1 Device topically 4 (four) times daily as needed. E11.9 1 Device 0  . Continuous Blood Gluc Sensor (FREESTYLE LIBRE 14 DAY SENSOR) MISC Apply 1 Device topically every 14 (fourteen) days. E11.9 6 each 3  . cyclobenzaprine (FLEXERIL) 10 MG tablet Take 1 tablet (10 mg total) by mouth 3 times/day as needed-between meals & bedtime for muscle spasms. 12 tablet 0  . levothyroxine (SYNTHROID) 25 MCG tablet TAKE 1 TABLET(25 MCG) BY MOUTH DAILY BEFORE BREAKFAST 90 tablet 3  . metFORMIN (GLUCOPHAGE-XR) 500 MG 24 hr tablet 4 tabs by mouth in the AM 360 tablet 3  . omeprazole (PRILOSEC) 20 MG capsule TAKE 1 CAPSULE BY MOUTH TWICE DAILY 180 capsule 3  . sildenafil (VIAGRA) 100 MG tablet Take 0.5-1 tablets (50-100 mg total) by mouth daily as needed for erectile dysfunction. 5 tablet 11  . zolpidem (AMBIEN) 10 MG tablet Take 1 tablet (10 mg total) by mouth at bedtime as needed for sleep. 30 tablet 5  . glucose blood (ACCU-CHEK AVIVA PLUS) test strip Use as instructed 100 each 0  . ibuprofen (ADVIL) 800 MG tablet TAKE 1 TABLET(800 MG) BY MOUTH TWICE DAILY AS  NEEDED 60 tablet 2  . irbesartan (AVAPRO) 300 MG tablet Take 1 tablet (300 mg total) by mouth daily. 90 tablet 3   No current facility-administered medications on file prior to visit.   Review of Systems All otherwise neg per pt     Objective:   Physical Exam BP 126/82   Pulse (!) 53   Temp 97.8 F (36.6 C)   Ht 5\' 9"  (1.753 m)   Wt 209 lb (94.8 kg)   SpO2 98%   BMI 30.86 kg/m  VS noted,  Constitutional: Pt appears in NAD HENT: Head: NCAT.  Right Ear: External ear normal.  Left Ear: External ear normal.    Eyes: . Pupils are equal, round, and reactive to light. Conjunctivae and EOM are normal Nose: without d/c or deformity Neck: Neck supple. Gross normal ROM Cardiovascular: Normal rate and regular rhythm.   Pulmonary/Chest: Effort normal and breath sounds without rales or wheezing.  Abd:  Soft, NT, ND, + BS, no organomegaly Neurological: Pt is alert. At baseline orientation, motor grossly intact Skin: Skin is warm. No rashes, other new lesions, no LE edema Psychiatric: Pt behavior is normal without agitation  All otherwise neg per pt Lab Results  Component Value Date   WBC 5.1 04/22/2019   HGB 14.3 04/22/2019   HCT 42.1 04/22/2019   PLT 198.0 04/22/2019   GLUCOSE 114 (H) 04/22/2019   CHOL 100 04/22/2019   TRIG 177.0 (H) 04/22/2019   HDL 46.00 04/22/2019   LDLDIRECT 66.0 10/21/2018   LDLCALC 18 04/22/2019   ALT 49 04/22/2019   AST 33 04/22/2019   NA 138 04/22/2019   K 3.9 04/22/2019   CL 102 04/22/2019   CREATININE 0.80 04/22/2019   BUN 12 04/22/2019   CO2 25 04/22/2019   TSH 3.32 04/22/2019   PSA 0.83 04/22/2019   INR 1.0 03/29/2010   HGBA1C 6.5 04/22/2019   MICROALBUR <0.7 04/22/2019      Assessment & Plan:

## 2019-04-27 NOTE — Patient Instructions (Addendum)
Ok to decrease the glipizide ER to 5 mg per day  Ok to continue the metformin ER 500 mg at 3 per day for now  [facility] to decrease the simvastatin to 20 mg per day  Please continue all other medications as before, and refills have been done if requested.  Please have the pharmacy call with any other refills you may need.  Please continue your efforts at being more active, low cholesterol diet, and weight control.  You are otherwise up to date with prevention measures today.  Please keep your appointments with your specialists as you may have planned  Please make an Appointment to return in 6 months, or sooner if needed, also with Lab Appointment for testing done 3-5 days before at the [place] (so this is for TWO appointments - please see the scheduling desk as you leave)

## 2019-04-27 NOTE — Assessment & Plan Note (Signed)

## 2019-04-27 NOTE — Assessment & Plan Note (Signed)
Overcontrolled - ok to decrease the zocor to 20 qd

## 2019-04-27 NOTE — Assessment & Plan Note (Signed)
Overcontrolled, ok to decrease the glipizide ER 5 qd, and trial of decreased metformin er 500 to 3 qam

## 2019-05-03 ENCOUNTER — Other Ambulatory Visit: Payer: Self-pay | Admitting: Internal Medicine

## 2019-05-05 ENCOUNTER — Other Ambulatory Visit: Payer: Self-pay | Admitting: Internal Medicine

## 2019-05-11 ENCOUNTER — Other Ambulatory Visit: Payer: Self-pay | Admitting: Internal Medicine

## 2019-05-15 ENCOUNTER — Other Ambulatory Visit: Payer: Self-pay | Admitting: Internal Medicine

## 2019-05-16 NOTE — Telephone Encounter (Signed)
Please refill as per office routine med refill policy (all routine meds refilled for 3 mo or monthly per pt preference up to one year from last visit, then month to month grace period for 3 mo, then further med refills will have to be denied)  

## 2019-06-21 ENCOUNTER — Other Ambulatory Visit: Payer: Self-pay | Admitting: Internal Medicine

## 2019-06-21 NOTE — Telephone Encounter (Signed)
Please refill as per office routine med refill policy (all routine meds refilled for 3 mo or monthly per pt preference up to one year from last visit, then month to month grace period for 3 mo, then further med refills will have to be denied)  

## 2019-06-29 ENCOUNTER — Other Ambulatory Visit: Payer: Self-pay | Admitting: Internal Medicine

## 2019-06-29 NOTE — Telephone Encounter (Signed)
Please refill as per office routine med refill policy (all routine meds refilled for 3 mo or monthly per pt preference up to one year from last visit, then month to month grace period for 3 mo, then further med refills will have to be denied)  

## 2019-08-08 ENCOUNTER — Inpatient Hospital Stay
Admission: RE | Admit: 2019-08-08 | Discharge: 2019-08-08 | Disposition: A | Discharge status: Home / Self Care | Payer: 59 | Source: Ambulatory Visit

## 2019-08-08 ENCOUNTER — Ambulatory Visit
Admission: RE | Admit: 2019-08-08 | Discharge: 2019-08-08 | Disposition: A | Discharge status: Home / Self Care | Payer: 59 | Source: Ambulatory Visit

## 2019-08-16 ENCOUNTER — Other Ambulatory Visit: Payer: Self-pay

## 2019-08-16 ENCOUNTER — Encounter: Payer: Self-pay | Admitting: Internal Medicine

## 2019-08-16 ENCOUNTER — Ambulatory Visit (INDEPENDENT_AMBULATORY_CARE_PROVIDER_SITE_OTHER): Payer: 59 | Admitting: Internal Medicine

## 2019-08-16 VITALS — BP 118/82 | HR 61 | Temp 97.8°F | Ht 69.0 in | Wt 211.0 lb

## 2019-08-16 DIAGNOSIS — E119 Type 2 diabetes mellitus without complications: Secondary | ICD-10-CM | POA: Diagnosis not present

## 2019-08-16 DIAGNOSIS — R1011 Right upper quadrant pain: Secondary | ICD-10-CM | POA: Diagnosis not present

## 2019-08-16 DIAGNOSIS — I10 Essential (primary) hypertension: Secondary | ICD-10-CM | POA: Diagnosis not present

## 2019-08-16 LAB — CBC WITH DIFFERENTIAL/PLATELET
Basophils Absolute: 0 10*3/uL (ref 0.0–0.1)
Basophils Relative: 0.5 % (ref 0.0–3.0)
Eosinophils Absolute: 0.1 10*3/uL (ref 0.0–0.7)
Eosinophils Relative: 0.9 % (ref 0.0–5.0)
HCT: 40.6 % (ref 39.0–52.0)
Hemoglobin: 13.8 g/dL (ref 13.0–17.0)
Lymphocytes Relative: 22.9 % (ref 12.0–46.0)
Lymphs Abs: 1.9 10*3/uL (ref 0.7–4.0)
MCHC: 34 g/dL (ref 30.0–36.0)
MCV: 91.3 fl (ref 78.0–100.0)
Monocytes Absolute: 0.6 10*3/uL (ref 0.1–1.0)
Monocytes Relative: 7.6 % (ref 3.0–12.0)
Neutro Abs: 5.7 10*3/uL (ref 1.4–7.7)
Neutrophils Relative %: 68.1 % (ref 43.0–77.0)
Platelets: 227 10*3/uL (ref 150.0–400.0)
RBC: 4.45 Mil/uL (ref 4.22–5.81)
RDW: 13.9 % (ref 11.5–15.5)
WBC: 8.4 10*3/uL (ref 4.0–10.5)

## 2019-08-16 LAB — URINALYSIS, ROUTINE W REFLEX MICROSCOPIC
Bilirubin Urine: NEGATIVE
Hgb urine dipstick: NEGATIVE
Ketones, ur: NEGATIVE
Nitrite: NEGATIVE
RBC / HPF: NONE SEEN (ref 0–?)
Specific Gravity, Urine: <=1.005 — AB (ref 1.000–1.030)
Total Protein, Urine: NEGATIVE
Urine Glucose: NEGATIVE
Urobilinogen, UA: 0.2 (ref 0.0–1.0)
pH: 5.5 (ref 5.0–8.0)

## 2019-08-16 LAB — HEMOGLOBIN A1C: Hgb A1c MFr Bld: 6.7 % — ABNORMAL HIGH (ref 4.6–6.5)

## 2019-08-16 LAB — BASIC METABOLIC PANEL
BUN: 13 mg/dL (ref 6–23)
CO2: 26 mEq/L (ref 19–32)
Calcium: 9.7 mg/dL (ref 8.4–10.5)
Chloride: 103 mEq/L (ref 96–112)
Creatinine, Ser: 0.81 mg/dL (ref 0.40–1.50)
GFR: 98.21 mL/min (ref >60.00)
Glucose, Bld: 121 mg/dL — ABNORMAL HIGH (ref 70–99)
Potassium: 4.5 mEq/L (ref 3.5–5.1)
Sodium: 138 mEq/L (ref 135–145)

## 2019-08-16 LAB — HEPATIC FUNCTION PANEL
ALT: 39 U/L (ref 0–53)
AST: 29 U/L (ref 0–37)
Albumin: 4.5 g/dL (ref 3.5–5.2)
Alkaline Phosphatase: 56 U/L (ref 39–117)
Bilirubin, Direct: 0.1 mg/dL (ref 0.0–0.3)
Total Bilirubin: 0.5 mg/dL (ref 0.2–1.2)
Total Protein: 6.6 g/dL (ref 6.0–8.3)

## 2019-08-16 LAB — LIPASE: Lipase: 21 U/L (ref 11.0–59.0)

## 2019-08-16 NOTE — Patient Instructions (Signed)
Please continue all other medications as before, and refills have been done if requested.  Please have the pharmacy call with any other refills you may need.  Please continue your efforts at being more active, low cholesterol diet, and weight control.  Please keep your appointments with your specialists as you may have planned  You will be contacted regarding the referral for: CT scan for next Monday  Please go to the LAB at the blood drawing area for the tests to be done  You will be contacted by phone if any changes need to be made immediately.  Otherwise, you will receive a letter about your results with an explanation, but please check with MyChart first.  Please remember to sign up for MyChart if you have not done so, as this will be important to you in the future with finding out test results, communicating by private email, and scheduling acute appointments online when needed.

## 2019-08-16 NOTE — Assessment & Plan Note (Addendum)
Etiology unclear, cant r/o pancreatic dz vs gb vs other, for labs as ordered, and CT abd/pelvis, consider ruq u/s, to f/u GI as planned  I spent 31 minutes in preparing to see the patient by review of recent labs, imaging and procedures, obtaining and reviewing separately obtained history, communicating with the patient and family or caregiver, ordering medications, tests or procedures, and documenting clinical information in the EHR including the differential Dx, treatment, and any further evaluation and other management of ruq pain, dm, htn

## 2019-08-16 NOTE — Assessment & Plan Note (Signed)
stable overall by history and exam, recent data reviewed with pt, and pt to continue medical treatment as before,  to f/u any worsening symptoms or concerns  

## 2019-08-16 NOTE — Progress Notes (Signed)
Subjective:    Patient ID: Troy Parsons, male    DOB: 06-04-1960, 59 y.o.   MRN: [id number]  HPI  Here with GI issues in the past several weeks with gas, bloating, change in lighter stool color and mild pain to right subscapular pain, nothing really makes better or worse, denies fever, chill, though has Lost 20 lbs with mostly trying to lose intentionally per pt Denies worsening reflux,  dysphagia, n/v, bowel change or blood. Pt denies chest pain, increased sob or doe, wheezing, orthopnea, PND, increased LE swelling, palpitations, dizziness or syncope.   Pt denies polydipsia, polyuria.  Pt sister has GB issues and he is concerned about himself.    Wt Readings from Last 3 Encounters:  08/16/19 211 lb (95.7 kg)  04/27/19 209 lb (94.8 kg)  10/27/18 232 lb (105.2 kg)   BP Readings from Last 3 Encounters:  08/16/19 118/82  04/27/19 126/82  10/27/18 (!) 142/92  has appt fu GI aug 23 with Dr stark. Past Medical History:  Diagnosis Date  . Abnormal liver function   . Allergy   . Depression   . Esophageal reflux   . Fatty liver   . Gout   . Hemorrhoids   . Hiatal hernia   . Hyperlipemia   . Type II or unspecified type diabetes mellitus without mention of complication, not stated as uncontrolled    Past Surgical History:  Procedure Laterality Date  . APPENDECTOMY    . NASAL SEPTUM SURGERY  2010  . UMBILICAL HERNIA REPAIR  2009    reports that he has never smoked. He has never used smokeless tobacco. He reports current alcohol use. He reports that he does not use drugs. family history includes Cancer in an other family member; Colon cancer in an other family member; Diabetes in his mother and paternal grandfather; Heart disease in an other family member; Hyperlipidemia in an other family member; Hypertension in an other family member; Stroke in an other family member. Allergies  Allergen Reactions  . Lipitor [Atorvastatin]    Current Outpatient Medications on File Prior to Visit    Medication Sig Dispense Refill  . allopurinol (ZYLOPRIM) 300 MG tablet TAKE 1 TABLET(300 MG) BY MOUTH DAILY 90 tablet 3  . ALPRAZolam (XANAX) 0.5 MG tablet Take 0.5 mg by mouth 3 (three) times daily as needed.    . Continuous Blood Gluc Receiver (FREESTYLE LIBRE 14 DAY READER) DEVI Apply 1 Device topically 4 (four) times daily as needed. E11.9 1 Device 0  . Continuous Blood Gluc Sensor (FREESTYLE LIBRE 14 DAY SENSOR) MISC Apply 1 Device topically every 14 (fourteen) days. E11.9 6 each 3  . cyclobenzaprine (FLEXERIL) 10 MG tablet Take 1 tablet (10 mg total) by mouth 3 times/day as needed-between meals & bedtime for muscle spasms. 12 tablet 0  . glipiZIDE (GLUCOTROL XL) 5 MG 24 hr tablet TAKE 1 TABLET(5 MG) BY MOUTH DAILY WITH BREAKFAST 90 tablet 3  . glucose blood (ACCU-CHEK AVIVA PLUS) test strip Use as instructed 100 each 0  . ibuprofen (ADVIL) 800 MG tablet TAKE 1 TABLET(800 MG) BY MOUTH TWICE DAILY AS NEEDED 60 tablet 2  . irbesartan (AVAPRO) 150 MG tablet TAKE 1 TABLET(150 MG) BY MOUTH DAILY 90 tablet 3  . irbesartan (AVAPRO) 300 MG tablet Take 1 tablet (300 mg total) by mouth daily. 90 tablet 3  . levothyroxine (SYNTHROID) 25 MCG tablet TAKE 1 TABLET(25 MCG) BY MOUTH DAILY BEFORE BREAKFAST 90 tablet 3  . metFORMIN (GLUCOPHAGE-XR) 500 MG  24 hr tablet TAKE 4 TABLETS BY MOUTH IN THE MORNING 360 tablet 2  . omeprazole (PRILOSEC) 20 MG capsule TAKE 1 CAPSULE BY MOUTH TWICE DAILY 180 capsule 3  . sildenafil (VIAGRA) 100 MG tablet TAKE 1/2 TO 1 TABLET(50 TO 100 MG) BY MOUTH DAILY AS NEEDED FOR ERECTILE DYSFUNCTION 5 tablet 11  . simvastatin (ZOCOR) 20 MG tablet Take 1 tablet (20 mg total) by mouth at bedtime. 90 tablet 3  . simvastatin (ZOCOR) 40 MG tablet TAKE 1 TABLET(40 MG) BY MOUTH AT BEDTIME 90 tablet 3  . zolpidem (AMBIEN) 10 MG tablet Take 1 tablet (10 mg total) by mouth at bedtime as needed for sleep. 30 tablet 5   No current facility-administered medications on file prior to visit.    Review of Systems All otherwise neg per pt     Objective:   Physical Exam SUBJECTIVE: VS noted,  Constitutional: Pt appears in NAD HENT: Head: NCAT.  Right Ear: External ear normal.  Left Ear: External ear normal.  Eyes: . Pupils are equal, round, and reactive to light. Conjunctivae and EOM are normal Nose: without d/c or deformity Neck: Neck supple. Gross normal ROM Cardiovascular: Normal rate and regular rhythm.   Pulmonary/Chest: Effort normal and breath sounds without rales or wheezing.  Abd:  Soft, NT, ND, + BS, no organomegaly Neurological: Pt is alert. At baseline orientation, motor grossly intact Skin: Skin is warm. No rashes, other new lesions, no LE edema Psychiatric: Pt behavior is normal without agitation  All otherwise neg per pt Lab Results  Component Value Date   WBC 5.1 04/22/2019   HGB 14.3 04/22/2019   HCT 42.1 04/22/2019   PLT 198.0 04/22/2019   GLUCOSE 114 (H) 04/22/2019   CHOL 100 04/22/2019   TRIG 177.0 (H) 04/22/2019   HDL 46.00 04/22/2019   LDLDIRECT 66.0 10/21/2018   LDLCALC 18 04/22/2019   ALT 49 04/22/2019   AST 33 04/22/2019   NA 138 04/22/2019   K 3.9 04/22/2019   CL 102 04/22/2019   CREATININE 0.80 04/22/2019   BUN 12 04/22/2019   CO2 25 04/22/2019   TSH 3.32 04/22/2019   PSA 0.83 04/22/2019   INR 1.0 03/29/2010   HGBA1C 6.5 04/22/2019   MICROALBUR <0.7 04/22/2019      Assessment & Plan:

## 2019-08-25 ENCOUNTER — Other Ambulatory Visit: Payer: Self-pay

## 2019-08-25 ENCOUNTER — Ambulatory Visit
Admission: RE | Admit: 2019-08-25 | Discharge: 2019-08-25 | Disposition: A | Discharge status: Home / Self Care | Payer: 59 | Source: Ambulatory Visit | Attending: Internal Medicine | Admitting: Internal Medicine

## 2019-08-25 DIAGNOSIS — R1011 Right upper quadrant pain: Secondary | ICD-10-CM

## 2019-08-25 MED ORDER — IOPAMIDOL (ISOVUE-300) INJECTION 61%
100.0000 mL | Freq: Once | INTRAVENOUS | Status: AC | PRN
  Administered 2019-08-25: 100 mL via INTRAVENOUS

## 2019-08-26 ENCOUNTER — Encounter: Payer: Self-pay | Admitting: Internal Medicine

## 2019-10-03 ENCOUNTER — Ambulatory Visit: Payer: 59 | Admitting: Gastroenterology

## 2019-10-10 ENCOUNTER — Other Ambulatory Visit: Payer: Self-pay | Admitting: Internal Medicine

## 2019-10-10 NOTE — Telephone Encounter (Signed)
Please refill as per office routine med refill policy (all routine meds refilled for 3 mo or monthly per pt preference up to one year from last visit, then month to month grace period for 3 mo, then further med refills will have to be denied)  

## 2019-10-11 ENCOUNTER — Other Ambulatory Visit: Payer: Self-pay | Admitting: Internal Medicine

## 2019-10-28 ENCOUNTER — Ambulatory Visit: Payer: 59 | Admitting: Internal Medicine

## 2019-11-10 ENCOUNTER — Ambulatory Visit: Payer: 59 | Admitting: Internal Medicine

## 2019-11-23 ENCOUNTER — Other Ambulatory Visit (INDEPENDENT_AMBULATORY_CARE_PROVIDER_SITE_OTHER): Payer: 59

## 2019-11-23 DIAGNOSIS — E119 Type 2 diabetes mellitus without complications: Secondary | ICD-10-CM

## 2019-11-23 LAB — BASIC METABOLIC PANEL
BUN: 16 mg/dL (ref 6–23)
CO2: 28 mEq/L (ref 19–32)
Calcium: 9.4 mg/dL (ref 8.4–10.5)
Chloride: 102 mEq/L (ref 96–112)
Creatinine, Ser: 0.85 mg/dL (ref 0.40–1.50)
GFR: 96.52 mL/min (ref >60.00)
Glucose, Bld: 146 mg/dL — ABNORMAL HIGH (ref 70–99)
Potassium: 4.1 mEq/L (ref 3.5–5.1)
Sodium: 138 mEq/L (ref 135–145)

## 2019-11-23 LAB — HEPATIC FUNCTION PANEL
ALT: 56 U/L — ABNORMAL HIGH (ref 0–53)
AST: 35 U/L (ref 0–37)
Albumin: 4.4 g/dL (ref 3.5–5.2)
Alkaline Phosphatase: 51 U/L (ref 39–117)
Bilirubin, Direct: 0.1 mg/dL (ref 0.0–0.3)
Total Bilirubin: 0.7 mg/dL (ref 0.2–1.2)
Total Protein: 6.7 g/dL (ref 6.0–8.3)

## 2019-11-23 LAB — LIPID PANEL
Cholesterol: 122 mg/dL (ref 0–200)
HDL: 43.9 mg/dL (ref >39.00)
NonHDL: 77.77
Total CHOL/HDL Ratio: 3
Triglycerides: 220 mg/dL — ABNORMAL HIGH (ref 0.0–149.0)
VLDL: 44 mg/dL — ABNORMAL HIGH (ref 0.0–40.0)

## 2019-11-23 LAB — LDL CHOLESTEROL, DIRECT: Direct LDL: 57 mg/dL

## 2019-11-23 LAB — HEMOGLOBIN A1C: Hgb A1c MFr Bld: 7.1 % — ABNORMAL HIGH (ref 4.6–6.5)

## 2019-11-29 ENCOUNTER — Ambulatory Visit (INDEPENDENT_AMBULATORY_CARE_PROVIDER_SITE_OTHER): Payer: 59 | Admitting: Internal Medicine

## 2019-11-29 ENCOUNTER — Encounter: Payer: Self-pay | Admitting: Internal Medicine

## 2019-11-29 ENCOUNTER — Other Ambulatory Visit: Payer: Self-pay

## 2019-11-29 VITALS — BP 130/90 | HR 65 | Temp 98.4°F | Ht 69.0 in | Wt 221.0 lb

## 2019-11-29 DIAGNOSIS — Z23 Encounter for immunization: Secondary | ICD-10-CM | POA: Diagnosis not present

## 2019-11-29 DIAGNOSIS — E039 Hypothyroidism, unspecified: Secondary | ICD-10-CM

## 2019-11-29 DIAGNOSIS — I10 Essential (primary) hypertension: Secondary | ICD-10-CM | POA: Diagnosis not present

## 2019-11-29 DIAGNOSIS — E1165 Type 2 diabetes mellitus with hyperglycemia: Secondary | ICD-10-CM | POA: Diagnosis not present

## 2019-11-29 DIAGNOSIS — E538 Deficiency of other specified B group vitamins: Secondary | ICD-10-CM

## 2019-11-29 DIAGNOSIS — E559 Vitamin D deficiency, unspecified: Secondary | ICD-10-CM

## 2019-11-29 DIAGNOSIS — E785 Hyperlipidemia, unspecified: Secondary | ICD-10-CM

## 2019-11-29 DIAGNOSIS — Z0000 Encounter for general adult medical examination without abnormal findings: Secondary | ICD-10-CM

## 2019-11-29 NOTE — Progress Notes (Signed)
Subjective:    Patient ID: Troy Parsons, male    DOB: 18-Jun-1960, 59 y.o.   MRN: [id number]  HPI  Here to f/u; overall doing ok,  Pt denies chest pain, increasing sob or doe, wheezing, orthopnea, PND, increased LE swelling, palpitations, dizziness or syncope.  Pt denies new neurological symptoms such as new headache, or facial or extremity weakness or numbness.  Pt denies polydipsia, polyuria, or low sugar episode.  Pt states overall good compliance with meds, mostly trying to follow appropriate diet, with wt overall stable,  but little exercise however. Travels for work much more recently post pandemic. Has had increase calories recently and on his own going by the [place] that he takes glipizide xl 10 mg and 3 metformin.  Has gained some wt with social for work purposes and traveling.  Wt Readings from Last 3 Encounters:  11/29/19 221 lb (100.2 kg)  08/16/19 211 lb (95.7 kg)  04/27/19 209 lb (94.8 kg)  Denies hyper or hypo thyroid symptoms such as voice, skin or hair change. Past Medical History:  Diagnosis Date  . Abnormal liver function   . Allergy   . Depression   . Esophageal reflux   . Fatty liver   . Gout   . Hemorrhoids   . Hiatal hernia   . Hyperlipemia   . Type II or unspecified type diabetes mellitus without mention of complication, not stated as uncontrolled    Past Surgical History:  Procedure Laterality Date  . APPENDECTOMY    . NASAL SEPTUM SURGERY  2010  . UMBILICAL HERNIA REPAIR  2009    reports that he has never smoked. He has never used smokeless tobacco. He reports current alcohol use. He reports that he does not use drugs. family history includes Cancer in an other family member; Colon cancer in an other family member; Diabetes in his mother and paternal grandfather; Heart disease in an other family member; Hyperlipidemia in an other family member; Hypertension in an other family member; Stroke in an other family member. Allergies  Allergen Reactions  .  Lipitor [Atorvastatin]    Current Outpatient Medications on File Prior to Visit  Medication Sig Dispense Refill  . allopurinol (ZYLOPRIM) 300 MG tablet TAKE 1 TABLET(300 MG) BY MOUTH DAILY 90 tablet 3  . ALPRAZolam (XANAX) 0.5 MG tablet Take 0.5 mg by mouth 3 (three) times daily as needed.    . Continuous Blood Gluc Receiver (FREESTYLE LIBRE 14 DAY READER) DEVI Apply 1 Device topically 4 (four) times daily as needed. E11.9 1 Device 0  . Continuous Blood Gluc Sensor (FREESTYLE LIBRE 14 DAY SENSOR) MISC APPLY SENSOR TO SKIN EVERY 14 DAYS 6 each 3  . cyclobenzaprine (FLEXERIL) 10 MG tablet Take 1 tablet (10 mg total) by mouth 3 times/day as needed-between meals & bedtime for muscle spasms. 12 tablet 0  . glipiZIDE (GLUCOTROL XL) 5 MG 24 hr tablet TAKE 1 TABLET(5 MG) BY MOUTH DAILY WITH BREAKFAST 90 tablet 3  . glucose blood (ACCU-CHEK AVIVA PLUS) test strip Use as instructed 100 each 0  . ibuprofen (ADVIL) 800 MG tablet TAKE 1 TABLET(800 MG) BY MOUTH TWICE DAILY AS NEEDED 60 tablet 2  . irbesartan (AVAPRO) 150 MG tablet TAKE 1 TABLET(150 MG) BY MOUTH DAILY 90 tablet 3  . irbesartan (AVAPRO) 300 MG tablet TAKE 1 TABLET(300 MG) BY MOUTH DAILY 90 tablet 3  . levothyroxine (SYNTHROID) 25 MCG tablet TAKE 1 TABLET(25 MCG) BY MOUTH DAILY BEFORE BREAKFAST 90 tablet 3  .  metFORMIN (GLUCOPHAGE-XR) 500 MG 24 hr tablet TAKE 4 TABLETS BY MOUTH IN THE MORNING 360 tablet 2  . omeprazole (PRILOSEC) 20 MG capsule TAKE 1 CAPSULE BY MOUTH TWICE DAILY 180 capsule 3  . sildenafil (VIAGRA) 100 MG tablet TAKE 1/2 TO 1 TABLET(50 TO 100 MG) BY MOUTH DAILY AS NEEDED FOR ERECTILE DYSFUNCTION 5 tablet 11  . simvastatin (ZOCOR) 20 MG tablet Take 1 tablet (20 mg total) by mouth at bedtime. 90 tablet 3  . simvastatin (ZOCOR) 40 MG tablet TAKE 1 TABLET(40 MG) BY MOUTH AT BEDTIME 90 tablet 3  . zolpidem (AMBIEN) 10 MG tablet Take 1 tablet (10 mg total) by mouth at bedtime as needed for sleep. 30 tablet 5   No current  facility-administered medications on file prior to visit.   Review of Systems All otherwise neg per pt    Objective:   Physical Exam BP 130/90 (BP Location: Left Arm, Patient Position: Sitting, Cuff Size: Large)   Pulse 65   Temp 98.4 F (36.9 C) (Oral)   Ht 5\' 9"  (1.753 m)   Wt 221 lb (100.2 kg)   SpO2 97%   BMI 32.64 kg/m  VS noted,  Constitutional: Pt appears in NAD HENT: Head: NCAT.  Right Ear: External ear normal.  Left Ear: External ear normal.  Eyes: . Pupils are equal, round, and reactive to light. Conjunctivae and EOM are normal Nose: without d/c or deformity Neck: Neck supple. Gross normal ROM Cardiovascular: Normal rate and regular rhythm.   Pulmonary/Chest: Effort normal and breath sounds without rales or wheezing.  Abd:  Soft, NT, ND, + BS, no organomegaly Neurological: Pt is alert. At baseline orientation, motor grossly intact Skin: Skin is warm. No rashes, other new lesions, no LE edema Psychiatric: Pt behavior is normal without agitation  All otherwise neg per pt Lab Results  Component Value Date   WBC 8.4 08/16/2019   HGB 13.8 08/16/2019   HCT 40.6 08/16/2019   PLT 227.0 08/16/2019   GLUCOSE 146 (H) 11/23/2019   CHOL 122 11/23/2019   TRIG 220.0 (H) 11/23/2019   HDL 43.90 11/23/2019   LDLDIRECT 57.0 11/23/2019   LDLCALC 18 04/22/2019   ALT 56 (H) 11/23/2019   AST 35 11/23/2019   NA 138 11/23/2019   K 4.1 11/23/2019   CL 102 11/23/2019   CREATININE 0.85 11/23/2019   BUN 16 11/23/2019   CO2 28 11/23/2019   TSH 3.32 04/22/2019   PSA 0.83 04/22/2019   INR 1.0 03/29/2010   HGBA1C 7.1 (H) 11/23/2019   MICROALBUR <0.7 04/22/2019      Assessment & Plan:

## 2019-11-29 NOTE — Patient Instructions (Addendum)
You had the flu shot today  Please continue all other medications as before, and refills have been done if requested.  Please have the pharmacy call with any other refills you may need.  Please continue your efforts at being more active, low cholesterol diet, and weight control.  You are otherwise up to date with prevention measures today.  Please keep your appointments with your specialists as you may have planned  Please make an Appointment to return in 6 months, or sooner if needed, also with Lab Appointment for testing done 3-5 days before at the [place] (so this is for TWO appointments - please see the scheduling desk as you leave)

## 2019-11-29 NOTE — Assessment & Plan Note (Signed)
stable overall by history and exam, recent data reviewed with pt, and pt to continue medical treatment as before,  to f/u any worsening symptoms or concerns  

## 2019-11-29 NOTE — Assessment & Plan Note (Addendum)
Mild worsening with back to travel and work and increased calories, ok to continue current increased meds as he is taking, cont libre monitoring, and work on DM diet, increased activity, wt loss  I spent 31 minutes in preparing to see the patient by review of recent labs, imaging and procedures, obtaining and reviewing separately obtained history, communicating with the patient and family or caregiver, ordering medications, tests or procedures, and documenting clinical information in the EHR including the differential Dx, treatment, and any further evaluation and other management of dm, htn, hld, hypothyroid

## 2019-12-27 ENCOUNTER — Encounter: Payer: Self-pay | Admitting: Internal Medicine

## 2019-12-28 MED ORDER — IBUPROFEN 800 MG PO TABS: ORAL_TABLET | ORAL | 2 refills | Status: DC

## 2019-12-28 MED ORDER — ZOLPIDEM TARTRATE 10 MG PO TABS: 10.0000 mg | ORAL_TABLET | Freq: Every evening | ORAL | 5 refills | Status: DC | PRN

## 2020-01-13 ENCOUNTER — Other Ambulatory Visit: Payer: Self-pay | Admitting: Internal Medicine

## 2020-01-13 NOTE — Telephone Encounter (Signed)
Please refill as per office routine med refill policy (all routine meds refilled for 3 mo or monthly per pt preference up to one year from last visit, then month to month grace period for 3 mo, then further med refills will have to be denied)  

## 2020-02-14 ENCOUNTER — Other Ambulatory Visit: Payer: Self-pay | Admitting: Internal Medicine

## 2020-02-14 NOTE — Telephone Encounter (Signed)
Please refill as per office routine med refill policy (all routine meds refilled for 3 mo or monthly per pt preference up to one year from last visit, then month to month grace period for 3 mo, then further med refills will have to be denied)  

## 2020-02-16 ENCOUNTER — Encounter: Payer: Self-pay | Admitting: Internal Medicine

## 2020-02-17 ENCOUNTER — Other Ambulatory Visit: Payer: Self-pay | Admitting: Internal Medicine

## 2020-02-17 MED ORDER — GLIPIZIDE ER 5 MG PO TB24: ORAL_TABLET | ORAL | 3 refills | Status: DC

## 2020-02-17 NOTE — Telephone Encounter (Signed)
Please refill as per office routine med refill policy (all routine meds refilled for 3 mo or monthly per pt preference up to one year from last visit, then month to month grace period for 3 mo, then further med refills will have to be denied)  

## 2020-03-11 ENCOUNTER — Encounter: Payer: Self-pay | Admitting: Internal Medicine

## 2020-03-12 ENCOUNTER — Other Ambulatory Visit: Payer: Self-pay

## 2020-03-12 MED ORDER — GLIPIZIDE ER 5 MG PO TB24: ORAL_TABLET | ORAL | 0 refills | Status: DC

## 2020-03-12 NOTE — Telephone Encounter (Signed)
[place] for early refill for this med, thanks

## 2020-03-13 ENCOUNTER — Ambulatory Visit: Payer: 59 | Admitting: Physician Assistant

## 2020-03-16 ENCOUNTER — Encounter: Payer: Self-pay | Admitting: Internal Medicine

## 2020-03-23 ENCOUNTER — Other Ambulatory Visit: Payer: Self-pay | Admitting: Internal Medicine

## 2020-04-30 LAB — HM DIABETES EYE EXAM

## 2020-05-08 ENCOUNTER — Encounter: Payer: Self-pay | Admitting: Internal Medicine

## 2020-05-12 ENCOUNTER — Other Ambulatory Visit: Payer: Self-pay | Admitting: Internal Medicine

## 2020-06-01 ENCOUNTER — Ambulatory Visit: Payer: 59 | Admitting: Internal Medicine

## 2020-06-01 ENCOUNTER — Other Ambulatory Visit (INDEPENDENT_AMBULATORY_CARE_PROVIDER_SITE_OTHER): Payer: 59

## 2020-06-01 DIAGNOSIS — Z0000 Encounter for general adult medical examination without abnormal findings: Secondary | ICD-10-CM

## 2020-06-01 DIAGNOSIS — E1165 Type 2 diabetes mellitus with hyperglycemia: Secondary | ICD-10-CM | POA: Diagnosis not present

## 2020-06-01 DIAGNOSIS — E538 Deficiency of other specified B group vitamins: Secondary | ICD-10-CM

## 2020-06-01 DIAGNOSIS — E559 Vitamin D deficiency, unspecified: Secondary | ICD-10-CM | POA: Diagnosis not present

## 2020-06-01 LAB — HEMOGLOBIN A1C: Hgb A1c MFr Bld: 7 % — ABNORMAL HIGH (ref 4.6–6.5)

## 2020-06-01 LAB — LIPID PANEL
Cholesterol: 87 mg/dL (ref 0–200)
HDL: 36.1 mg/dL — ABNORMAL LOW (ref >39.00)
LDL Cholesterol: 23 mg/dL (ref 0–99)
NonHDL: 51.12
Total CHOL/HDL Ratio: 2
Triglycerides: 140 mg/dL (ref 0.0–149.0)
VLDL: 28 mg/dL (ref 0.0–40.0)

## 2020-06-01 LAB — URINALYSIS, ROUTINE W REFLEX MICROSCOPIC
Bilirubin Urine: NEGATIVE
Hgb urine dipstick: NEGATIVE
Ketones, ur: NEGATIVE
Leukocytes,Ua: NEGATIVE
Nitrite: NEGATIVE
Specific Gravity, Urine: 1.015 (ref 1.000–1.030)
Total Protein, Urine: NEGATIVE
Urine Glucose: NEGATIVE
Urobilinogen, UA: 0.2 (ref 0.0–1.0)
pH: 6 (ref 5.0–8.0)

## 2020-06-01 LAB — BASIC METABOLIC PANEL
BUN: 14 mg/dL (ref 6–23)
CO2: 27 mEq/L (ref 19–32)
Calcium: 9.3 mg/dL (ref 8.4–10.5)
Chloride: 104 mEq/L (ref 96–112)
Creatinine, Ser: 0.91 mg/dL (ref 0.40–1.50)
GFR: 93.35 mL/min (ref >60.00)
Glucose, Bld: 144 mg/dL — ABNORMAL HIGH (ref 70–99)
Potassium: 4.2 mEq/L (ref 3.5–5.1)
Sodium: 139 mEq/L (ref 135–145)

## 2020-06-01 LAB — HEPATIC FUNCTION PANEL
ALT: 43 U/L (ref 0–53)
AST: 28 U/L (ref 0–37)
Albumin: 4.3 g/dL (ref 3.5–5.2)
Alkaline Phosphatase: 45 U/L (ref 39–117)
Bilirubin, Direct: 0.1 mg/dL (ref 0.0–0.3)
Total Bilirubin: 0.5 mg/dL (ref 0.2–1.2)
Total Protein: 6.6 g/dL (ref 6.0–8.3)

## 2020-06-01 LAB — CBC WITH DIFFERENTIAL/PLATELET
Basophils Absolute: 0 10*3/uL (ref 0.0–0.1)
Basophils Relative: 0.7 % (ref 0.0–3.0)
Eosinophils Absolute: 0 10*3/uL (ref 0.0–0.7)
Eosinophils Relative: 0.8 % (ref 0.0–5.0)
HCT: 42 % (ref 39.0–52.0)
Hemoglobin: 14.2 g/dL (ref 13.0–17.0)
Lymphocytes Relative: 34.3 % (ref 12.0–46.0)
Lymphs Abs: 2 10*3/uL (ref 0.7–4.0)
MCHC: 33.8 g/dL (ref 30.0–36.0)
MCV: 89.4 fl (ref 78.0–100.0)
Monocytes Absolute: 0.5 10*3/uL (ref 0.1–1.0)
Monocytes Relative: 8.2 % (ref 3.0–12.0)
Neutro Abs: 3.3 10*3/uL (ref 1.4–7.7)
Neutrophils Relative %: 56 % (ref 43.0–77.0)
Platelets: 215 10*3/uL (ref 150.0–400.0)
RBC: 4.69 Mil/uL (ref 4.22–5.81)
RDW: 13.6 % (ref 11.5–15.5)
WBC: 5.9 10*3/uL (ref 4.0–10.5)

## 2020-06-01 LAB — TSH: TSH: 2.78 u[IU]/mL (ref 0.35–4.50)

## 2020-06-01 LAB — MICROALBUMIN / CREATININE URINE RATIO
Creatinine,U: 123.4 mg/dL
Microalb Creat Ratio: 0.8 mg/g (ref 0.0–30.0)
Microalb, Ur: 1 mg/dL (ref 0.0–1.9)

## 2020-06-01 LAB — VITAMIN D 25 HYDROXY (VIT D DEFICIENCY, FRACTURES): VITD: 72.74 ng/mL (ref 30.00–100.00)

## 2020-06-01 LAB — PSA: PSA: 0.75 ng/mL (ref 0.10–4.00)

## 2020-06-01 LAB — VITAMIN B12: Vitamin B-12: 718 pg/mL (ref 211–911)

## 2020-06-07 ENCOUNTER — Ambulatory Visit (INDEPENDENT_AMBULATORY_CARE_PROVIDER_SITE_OTHER): Payer: 59 | Admitting: Internal Medicine

## 2020-06-07 ENCOUNTER — Other Ambulatory Visit: Payer: Self-pay

## 2020-06-07 VITALS — BP 132/86 | HR 67 | Temp 98.6°F | Ht 69.0 in | Wt 227.0 lb

## 2020-06-07 DIAGNOSIS — Z23 Encounter for immunization: Secondary | ICD-10-CM

## 2020-06-07 DIAGNOSIS — F419 Anxiety disorder, unspecified: Secondary | ICD-10-CM

## 2020-06-07 DIAGNOSIS — E786 Lipoprotein deficiency: Secondary | ICD-10-CM | POA: Insufficient documentation

## 2020-06-07 DIAGNOSIS — Z6833 Body mass index (BMI) 33.0-33.9, adult: Secondary | ICD-10-CM

## 2020-06-07 DIAGNOSIS — E1165 Type 2 diabetes mellitus with hyperglycemia: Secondary | ICD-10-CM | POA: Diagnosis not present

## 2020-06-07 DIAGNOSIS — Z0001 Encounter for general adult medical examination with abnormal findings: Secondary | ICD-10-CM

## 2020-06-07 DIAGNOSIS — E291 Testicular hypofunction: Secondary | ICD-10-CM | POA: Insufficient documentation

## 2020-06-07 DIAGNOSIS — Z1211 Encounter for screening for malignant neoplasm of colon: Secondary | ICD-10-CM

## 2020-06-07 DIAGNOSIS — I10 Essential (primary) hypertension: Secondary | ICD-10-CM

## 2020-06-07 DIAGNOSIS — E039 Hypothyroidism, unspecified: Secondary | ICD-10-CM

## 2020-06-07 DIAGNOSIS — E559 Vitamin D deficiency, unspecified: Secondary | ICD-10-CM

## 2020-06-07 DIAGNOSIS — E782 Mixed hyperlipidemia: Secondary | ICD-10-CM | POA: Diagnosis not present

## 2020-06-07 DIAGNOSIS — K760 Fatty (change of) liver, not elsewhere classified: Secondary | ICD-10-CM | POA: Insufficient documentation

## 2020-06-07 MED ORDER — SILDENAFIL CITRATE 100 MG PO TABS: ORAL_TABLET | ORAL | 11 refills | Status: DC

## 2020-06-07 MED ORDER — METFORMIN HCL ER 500 MG PO TB24: ORAL_TABLET | ORAL | 3 refills | Status: DC

## 2020-06-07 MED ORDER — SEMAGLUTIDE-WEIGHT MANAGEMENT 2.4 MG/0.75ML ~~LOC~~ SOAJ: 2.4000 mg | SUBCUTANEOUS | 5 refills | Status: DC

## 2020-06-07 MED ORDER — LEVOTHYROXINE SODIUM 25 MCG PO TABS: ORAL_TABLET | ORAL | 3 refills | Status: DC

## 2020-06-07 MED ORDER — OMEPRAZOLE 20 MG PO CPDR: 1.0000 | DELAYED_RELEASE_CAPSULE | Freq: Two times a day (BID) | ORAL | 3 refills | Status: DC

## 2020-06-07 MED ORDER — SEMAGLUTIDE-WEIGHT MANAGEMENT 0.25 MG/0.5ML ~~LOC~~ SOAJ: 0.2500 mg | SUBCUTANEOUS | 0 refills | Status: DC

## 2020-06-07 MED ORDER — ALLOPURINOL 300 MG PO TABS: ORAL_TABLET | ORAL | 3 refills | Status: DC

## 2020-06-07 MED ORDER — SEMAGLUTIDE-WEIGHT MANAGEMENT 0.5 MG/0.5ML ~~LOC~~ SOAJ: 0.5000 mg | SUBCUTANEOUS | 0 refills | Status: DC

## 2020-06-07 MED ORDER — ALPRAZOLAM 0.5 MG PO TABS: 0.5000 mg | ORAL_TABLET | Freq: Two times a day (BID) | ORAL | 0 refills | Status: DC | PRN

## 2020-06-07 MED ORDER — SIMVASTATIN 40 MG PO TABS: ORAL_TABLET | ORAL | 3 refills | Status: DC

## 2020-06-07 MED ORDER — GLIPIZIDE ER 10 MG PO TB24: 10.0000 mg | ORAL_TABLET | Freq: Every day | ORAL | 3 refills | Status: DC

## 2020-06-07 MED ORDER — SEMAGLUTIDE-WEIGHT MANAGEMENT 1.7 MG/0.75ML ~~LOC~~ SOAJ: 1.7000 mg | SUBCUTANEOUS | 0 refills | Status: DC

## 2020-06-07 MED ORDER — SEMAGLUTIDE-WEIGHT MANAGEMENT 1 MG/0.5ML ~~LOC~~ SOAJ: 1.0000 mg | SUBCUTANEOUS | 0 refills | Status: DC

## 2020-06-07 NOTE — Progress Notes (Signed)
Patient ID: Troy Parsons, male   DOB: [birth year], 60 y.o.   MRN: [id number]         Chief Complaint:: wellness exam and Follow-up  DM, obesity, low vit d, and anxiety       HPI:  Troy Parsons is a 60 y.o. male here for wellness exam; due for colonoscopy and Tdap, o/w up to date with preventive referrals and immunizations                        Also Pt denies chest pain, increased sob or doe, wheezing, orthopnea, PND, increased LE swelling, palpitations, dizziness or syncope.   Pt denies polydipsia, polyuria, or new focal neuro s/s.  Not taking Vit D.  Wants to lose wt - asks for wegovy.  Denies worsening depressive symptoms, suicidal ideation, or panic; has ongoing anxiety.   Pt denies fever, wt loss, night sweats, loss of appetite, or other constitutional symptoms   No other new complaints   Wt Readings from Last 3 Encounters:  06/07/20 227 lb (103 kg)  11/29/19 221 lb (100.2 kg)  08/16/19 211 lb (95.7 kg)   BP Readings from Last 3 Encounters:  06/07/20 132/86  11/29/19 130/90  08/16/19 118/82   Immunization History  Administered Date(s) Administered  . Influenza Whole 11/22/2007, 12/18/2008  . Influenza,inj,Quad PF,6+ Mos 01/19/2013, 12/13/2017, 10/27/2018, 11/29/2019  . Influenza-Unspecified 11/28/2015, 10/30/2016, 12/11/2017  . MMR 06/16/2017  . PFIZER(Purple Top)SARS-COV-2 Vaccination 04/06/2019, 05/04/2019, 12/29/2019  . Pneumococcal Conjugate-13 03/30/2015  . Pneumococcal Polysaccharide-23 04/16/2017  . Td 12/29/2008  . Tdap 06/07/2020   Health Maintenance Due  Topic Date Due  . COLONOSCOPY (Pts 45-59yr Insurance coverage will need to be confirmed)  04/01/2020      Past Medical History:  Diagnosis Date  . Abnormal liver function   . Allergy   . Depression   . Esophageal reflux   . Fatty liver   . Gout   . Hemorrhoids   . Hiatal hernia   . Hyperlipemia   . Type II or unspecified type diabetes mellitus without mention of complication, not stated as  uncontrolled    Past Surgical History:  Procedure Laterality Date  . APPENDECTOMY    . NASAL SEPTUM SURGERY  2010  . UMBILICAL HERNIA REPAIR  2009    reports that he has never smoked. He has never used smokeless tobacco. He reports current alcohol use. He reports that he does not use drugs. family history includes Cancer in an other family member; Colon cancer in an other family member; Diabetes in his mother and paternal grandfather; Heart disease in an other family member; Hyperlipidemia in an other family member; Hypertension in an other family member; Stroke in an other family member. Allergies  Allergen Reactions  . Lipitor [Atorvastatin]    Current Outpatient Medications on File Prior to Visit  Medication Sig Dispense Refill  . Continuous Blood Gluc Receiver (FREESTYLE LIBRE 14 DAY READER) DEVI Apply 1 Device topically 4 (four) times daily as needed. E11.9 1 Device 0  . Continuous Blood Gluc Sensor (FREESTYLE LIBRE 14 DAY SENSOR) MISC APPLY SENSOR TO SKIN EVERY 14 DAYS 6 each 3  . cyclobenzaprine (FLEXERIL) 10 MG tablet Take 1 tablet (10 mg total) by mouth 3 times/day as needed-between meals & bedtime for muscle spasms. 12 tablet 0  . glucose blood (ACCU-CHEK AVIVA PLUS) test strip Use as instructed 100 each 0  . ibuprofen (ADVIL) 800 MG tablet TAKE 1 TABLET BY MOUTH  TWICE DAILY AS NEEDED 60 tablet 2  . irbesartan (AVAPRO) 150 MG tablet TAKE 1 TABLET(150 MG) BY MOUTH DAILY 90 tablet 3  . zolpidem (AMBIEN) 10 MG tablet Take 1 tablet (10 mg total) by mouth at bedtime as needed for sleep. 30 tablet 5  . Ascorbic Acid (VITAMIN C) 500 MG CAPS See admin instructions.    . Cholecalciferol (VITAMIN D) 50 MCG (2000 UT) tablet 1 tablet    . Cyanocobalamin (B-12) 1000 MCG TABS 1054mg    . Zinc 100 MG TABS 1 tablet     No current facility-administered medications on file prior to visit.        ROS:  All others reviewed and negative.  Objective        PE:  BP 132/86 (BP Location: Left  Arm, Patient Position: Sitting, Cuff Size: Large)   Pulse 67   Temp 98.6 F (37 C) (Oral)   Ht 5' 9" (1.753 m)   Wt 227 lb (103 kg)   SpO2 93%   BMI 33.52 kg/m                 Constitutional: Pt appears in NAD               HENT: Head: NCAT.                Right Ear: External ear normal.                 Left Ear: External ear normal.                Eyes: . Pupils are equal, round, and reactive to light. Conjunctivae and EOM are normal               Nose: without d/c or deformity               Neck: Neck supple. Gross normal ROM               Cardiovascular: Normal rate and regular rhythm.                 Pulmonary/Chest: Effort normal and breath sounds without rales or wheezing.                Abd:  Soft, NT, ND, + BS, no organomegaly               Neurological: Pt is alert. At baseline orientation, motor grossly intact               Skin: Skin is warm. No rashes, no other new lesions, LE edema - none               Psychiatric: Pt behavior is normal without agitation   Micro: none  Cardiac tracings I have personally interpreted today:  none  Pertinent Radiological findings (summarize): none   Lab Results  Component Value Date   WBC 5.9 06/01/2020   HGB 14.2 06/01/2020   HCT 42.0 06/01/2020   PLT 215.0 06/01/2020   GLUCOSE 144 (H) 06/01/2020   CHOL 87 06/01/2020   TRIG 140.0 06/01/2020   HDL 36.10 (L) 06/01/2020   LDLDIRECT 57.0 11/23/2019   LDLCALC 23 06/01/2020   ALT 43 06/01/2020   AST 28 06/01/2020   NA 139 06/01/2020   K 4.2 06/01/2020   CL 104 06/01/2020   CREATININE 0.91 06/01/2020   BUN 14 06/01/2020   CO2 27 06/01/2020   TSH 2.78 06/01/2020   PSA 0.75  06/01/2020   INR 1.0 03/29/2010   HGBA1C 7.0 (H) 06/01/2020   MICROALBUR 1.0 06/01/2020   Assessment/Plan:  Troy Parsons is a 60 y.o. White or Caucasian [1] male with  has a past medical history of Abnormal liver function, Allergy, Depression, Esophageal reflux, Fatty liver, Gout, Hemorrhoids, Hiatal  hernia, Hyperlipemia, and Type II or unspecified type diabetes mellitus without mention of complication, not stated as uncontrolled.  Encounter for well adult exam with abnormal findings Age and sex appropriate education and counseling updated with regular exercise and diet Referrals for preventative services - for colonoscopy Immunizations addressed - for Tdap Smoking counseling  - none needed Evidence for depression or other mood disorder - none significant Most recent labs reviewed. I have personally reviewed and have noted: 1) the patient's medical and social history 2) The patient's current medications and supplements 3) The patient's height, weight, and BMI have been recorded in the chart   Type 2 diabetes mellitus ([place]) Lab Results  Component Value Date   HGBA1C 7.0 (H) 06/01/2020   Uncontrolled, for increased glipzide ER 10 qd, cont metformin   Mixed hyperlipidemia Lab Results  Component Value Date   LDLCALC 23 06/01/2020   Stable, pt to continue current statin - zocor 40   Hypothyroidism Lab Results  Component Value Date   TSH 2.78 06/01/2020   Stable, pt to continue levothyroxine  Hypertension BP Readings from Last 3 Encounters:  06/07/20 132/86  11/29/19 130/90  08/16/19 118/82   Stable, pt to continue medical treatment  - avapro   Body mass index (BMI) 33.0-33.9, adult Ok for [employer] rx,  to f/u any worsening symptoms or concerns  Vitamin D deficiency Last vitamin D Lab Results  Component Value Date   VD25OH 72.74 06/01/2020   Stable, cont oral replacement   Anxiety Overall chronic mild persistent, for med refill xanax prn  Followup: Return in about 6 months (around 12/07/2020).  Cathlean Cower, MD 06/10/2020 1:35 AM [facility]

## 2020-06-07 NOTE — Patient Instructions (Addendum)
You had the Tdap tetanus shot today  You will be contacted regarding the referral for: colonoscopy  Ok to increase the glipizide ER to 10 mg per day  Please take all new medication as prescribed - the wegovy  Please continue all other medications as before, and refills have been done if requested - xanax  Please have the pharmacy call with any other refills you may need.  Please continue your efforts at being more active, low cholesterol diet, and weight control.  You are otherwise up to date with prevention measures today.  Please keep your appointments with your specialists as you may have planned  Please make an Appointment to return in 6 months, or sooner if needed, also with Lab Appointment for testing done 3-5 days before at the [facility] (so this is for TWO appointments - please see the scheduling desk as you leave)  Due to the ongoing Covid 19 pandemic, our lab now requires an appointment for any labs done at our office.  If you need labs done and do not have an appointment, please call our office ahead of time to schedule before presenting to the lab for your testing.   Marland Kitchen

## 2020-06-10 ENCOUNTER — Encounter: Payer: Self-pay | Admitting: Internal Medicine

## 2020-06-10 DIAGNOSIS — F419 Anxiety disorder, unspecified: Secondary | ICD-10-CM | POA: Insufficient documentation

## 2020-06-10 DIAGNOSIS — E559 Vitamin D deficiency, unspecified: Secondary | ICD-10-CM | POA: Insufficient documentation

## 2020-06-10 NOTE — Assessment & Plan Note (Addendum)
Overall chronic mild persistent, for med refill xanax prn

## 2020-06-10 NOTE — Assessment & Plan Note (Signed)
Age and sex appropriate education and counseling updated with regular exercise and diet Referrals for preventative services - for colonoscopy Immunizations addressed - for Tdap Smoking counseling  - none needed Evidence for depression or other mood disorder - none significant Most recent labs reviewed. I have personally reviewed and have noted: 1) the patient's medical and social history 2) The patient's current medications and supplements 3) The patient's height, weight, and BMI have been recorded in the chart

## 2020-06-10 NOTE — Assessment & Plan Note (Signed)
Lab Results  Component Value Date   HGBA1C 7.0 (H) 06/01/2020   Uncontrolled, for increased glipzide ER 10 qd, cont metformin

## 2020-06-10 NOTE — Assessment & Plan Note (Signed)
Last vitamin D Lab Results  Component Value Date   VD25OH 72.74 06/01/2020   Stable, cont oral replacement

## 2020-06-10 NOTE — Assessment & Plan Note (Signed)
[place] for [employer],  to f/u any worsening symptoms or concerns

## 2020-06-10 NOTE — Assessment & Plan Note (Signed)
Lab Results  Component Value Date   LDLCALC 23 06/01/2020   Stable, pt to continue current statin - zocor 40

## 2020-06-10 NOTE — Assessment & Plan Note (Signed)
BP Readings from Last 3 Encounters:  06/07/20 132/86  11/29/19 130/90  08/16/19 118/82   Stable, pt to continue medical treatment  - avapro

## 2020-06-10 NOTE — Assessment & Plan Note (Signed)
Lab Results  Component Value Date   TSH 2.78 06/01/2020   Stable, pt to continue levothyroxine

## 2020-06-11 ENCOUNTER — Telehealth: Payer: Self-pay

## 2020-06-11 NOTE — Telephone Encounter (Signed)
QZRAQT Prior Auth Began [id number]

## 2020-06-12 ENCOUNTER — Encounter: Payer: Self-pay | Admitting: Internal Medicine

## 2020-06-12 MED ORDER — OZEMPIC (0.25 OR 0.5 MG/DOSE) 2 MG/1.5ML ~~LOC~~ SOPN: 0.5000 mg | PEN_INJECTOR | SUBCUTANEOUS | 3 refills | Status: DC

## 2020-07-05 ENCOUNTER — Other Ambulatory Visit: Payer: Self-pay | Admitting: Internal Medicine

## 2020-09-14 ENCOUNTER — Other Ambulatory Visit: Payer: Self-pay | Admitting: Internal Medicine

## 2020-09-27 ENCOUNTER — Other Ambulatory Visit: Payer: Self-pay | Admitting: Internal Medicine

## 2020-09-29 ENCOUNTER — Encounter: Payer: Self-pay | Admitting: Internal Medicine

## 2020-10-05 ENCOUNTER — Other Ambulatory Visit: Payer: Self-pay | Admitting: Internal Medicine

## 2020-10-05 NOTE — Telephone Encounter (Signed)
Please refill as per office routine med refill policy (all routine meds to be refilled for 3 mo or monthly (per pt preference) up to one year from last visit, then month to month grace period for 3 mo, then further med refills will have to be denied) ? ?

## 2020-10-31 ENCOUNTER — Other Ambulatory Visit: Payer: Self-pay | Admitting: Internal Medicine

## 2020-10-31 NOTE — Telephone Encounter (Signed)
Please refill as per office routine med refill policy (all routine meds to be refilled for 3 mo or monthly (per pt preference) up to one year from last visit, then month to month grace period for 3 mo, then further med refills will have to be denied) ? ?

## 2020-11-02 ENCOUNTER — Encounter: Payer: Self-pay | Admitting: Gastroenterology

## 2020-11-07 DIAGNOSIS — H903 Sensorineural hearing loss, bilateral: Secondary | ICD-10-CM | POA: Insufficient documentation

## 2020-11-07 DIAGNOSIS — H9313 Tinnitus, bilateral: Secondary | ICD-10-CM | POA: Insufficient documentation

## 2020-11-13 ENCOUNTER — Other Ambulatory Visit: Payer: Self-pay | Admitting: Physician Assistant

## 2020-11-13 DIAGNOSIS — H903 Sensorineural hearing loss, bilateral: Secondary | ICD-10-CM

## 2020-11-14 ENCOUNTER — Other Ambulatory Visit: Payer: Self-pay | Admitting: Internal Medicine

## 2020-11-14 MED ORDER — IBUPROFEN 800 MG PO TABS: ORAL_TABLET | ORAL | 2 refills | Status: DC

## 2020-12-01 ENCOUNTER — Other Ambulatory Visit: Payer: Self-pay | Admitting: Internal Medicine

## 2020-12-03 ENCOUNTER — Other Ambulatory Visit (INDEPENDENT_AMBULATORY_CARE_PROVIDER_SITE_OTHER): Payer: 59

## 2020-12-03 DIAGNOSIS — E1165 Type 2 diabetes mellitus with hyperglycemia: Secondary | ICD-10-CM

## 2020-12-03 DIAGNOSIS — E559 Vitamin D deficiency, unspecified: Secondary | ICD-10-CM

## 2020-12-03 LAB — LIPID PANEL
Cholesterol: 106 mg/dL (ref 0–200)
HDL: 36.2 mg/dL — ABNORMAL LOW (ref >39.00)
LDL Cholesterol: 36 mg/dL (ref 0–99)
NonHDL: 69.4
Total CHOL/HDL Ratio: 3
Triglycerides: 167 mg/dL — ABNORMAL HIGH (ref 0.0–149.0)
VLDL: 33.4 mg/dL (ref 0.0–40.0)

## 2020-12-03 LAB — BASIC METABOLIC PANEL
BUN: 15 mg/dL (ref 6–23)
CO2: 26 mEq/L (ref 19–32)
Calcium: 9.2 mg/dL (ref 8.4–10.5)
Chloride: 103 mEq/L (ref 96–112)
Creatinine, Ser: 0.93 mg/dL (ref 0.40–1.50)
GFR: 90.63 mL/min (ref >60.00)
Glucose, Bld: 110 mg/dL — ABNORMAL HIGH (ref 70–99)
Potassium: 3.7 mEq/L (ref 3.5–5.1)
Sodium: 139 mEq/L (ref 135–145)

## 2020-12-03 LAB — HEPATIC FUNCTION PANEL
ALT: 37 U/L (ref 0–53)
AST: 30 U/L (ref 0–37)
Albumin: 4.4 g/dL (ref 3.5–5.2)
Alkaline Phosphatase: 37 U/L — ABNORMAL LOW (ref 39–117)
Bilirubin, Direct: 0.1 mg/dL (ref 0.0–0.3)
Total Bilirubin: 0.7 mg/dL (ref 0.2–1.2)
Total Protein: 6.6 g/dL (ref 6.0–8.3)

## 2020-12-03 LAB — HEMOGLOBIN A1C: Hgb A1c MFr Bld: 6.4 % (ref 4.6–6.5)

## 2020-12-03 LAB — VITAMIN D 25 HYDROXY (VIT D DEFICIENCY, FRACTURES): VITD: 75.37 ng/mL (ref 30.00–100.00)

## 2020-12-04 ENCOUNTER — Other Ambulatory Visit: Payer: Self-pay

## 2020-12-04 ENCOUNTER — Ambulatory Visit (AMBULATORY_SURGERY_CENTER): Payer: 59

## 2020-12-04 ENCOUNTER — Ambulatory Visit
Admission: RE | Admit: 2020-12-04 | Discharge: 2020-12-04 | Disposition: A | Discharge status: Home / Self Care | Payer: 59 | Source: Ambulatory Visit | Attending: Physician Assistant | Admitting: Physician Assistant

## 2020-12-04 VITALS — Ht 69.0 in | Wt 210.0 lb

## 2020-12-04 DIAGNOSIS — Z1211 Encounter for screening for malignant neoplasm of colon: Secondary | ICD-10-CM

## 2020-12-04 DIAGNOSIS — H903 Sensorineural hearing loss, bilateral: Secondary | ICD-10-CM

## 2020-12-04 MED ORDER — GADOBENATE DIMEGLUMINE 529 MG/ML IV SOLN
20.0000 mL | Freq: Once | INTRAVENOUS | Status: AC | PRN
  Administered 2020-12-04: 20 mL via INTRAVENOUS

## 2020-12-04 MED ORDER — PLENVU 140 G PO SOLR: 1.0000 | ORAL | 0 refills | Status: DC

## 2020-12-04 NOTE — Progress Notes (Signed)
Pre visit completed via phone call; Patient verified name, DOB, and address; No egg or soy allergy known to patient  No issues known to pt with past sedation with any surgeries or procedures Patient denies ever being told they had issues or difficulty with intubation  No FH of Malignant Hyperthermia Pt is not on diet pills Pt is not on home 02  Pt is not on blood thinners  Pt denies issues with constipation at this time; No A fib or A flutter Pt is fully vaccinated for Covid x 2; Coupon given to pt in PV today, Code to Pharmacy and NO PA's for preps discussed with pt in PV today  Discussed with pt there will be an out-of-pocket cost for prep and that varies from $0 to 70 + dollars - pt verbalized understanding  Due to the COVID-19 pandemic we are asking patients to follow certain guidelines in PV and the [place]   Pt aware of COVID protocols and [facility] guidelines

## 2020-12-05 ENCOUNTER — Encounter: Payer: Self-pay | Admitting: Gastroenterology

## 2020-12-05 ENCOUNTER — Ambulatory Visit (INDEPENDENT_AMBULATORY_CARE_PROVIDER_SITE_OTHER): Payer: 59 | Admitting: Internal Medicine

## 2020-12-05 ENCOUNTER — Encounter: Payer: Self-pay | Admitting: Internal Medicine

## 2020-12-05 VITALS — BP 128/70 | HR 70 | Ht 69.0 in | Wt 209.0 lb

## 2020-12-05 DIAGNOSIS — E782 Mixed hyperlipidemia: Secondary | ICD-10-CM

## 2020-12-05 DIAGNOSIS — E538 Deficiency of other specified B group vitamins: Secondary | ICD-10-CM

## 2020-12-05 DIAGNOSIS — E1165 Type 2 diabetes mellitus with hyperglycemia: Secondary | ICD-10-CM

## 2020-12-05 DIAGNOSIS — E559 Vitamin D deficiency, unspecified: Secondary | ICD-10-CM

## 2020-12-05 DIAGNOSIS — Z0000 Encounter for general adult medical examination without abnormal findings: Secondary | ICD-10-CM

## 2020-12-05 DIAGNOSIS — Z23 Encounter for immunization: Secondary | ICD-10-CM

## 2020-12-05 DIAGNOSIS — I10 Essential (primary) hypertension: Secondary | ICD-10-CM

## 2020-12-05 MED ORDER — GLIPIZIDE ER 5 MG PO TB24: 5.0000 mg | ORAL_TABLET | Freq: Every day | ORAL | 3 refills | Status: DC

## 2020-12-05 MED ORDER — FREESTYLE LIBRE 3 SENSOR MISC: 1.0000 "application " | 3 refills | Status: DC

## 2020-12-05 NOTE — Patient Instructions (Addendum)
Please remember to have the second shingrix shot at the [facility]  You had the flu shot today  Ok to decrease the glipizide ER to 5 mg per day  [facility] for the [facility] 3 sensors  Please continue all other medications as before, and refills have been done if requested.  Please have the pharmacy call with any other refills you may need.  Please continue your efforts at being more active, low cholesterol diet, and weight control  Please keep your appointments with your specialists as you may have planned  Please make an Appointment to return in 6 months, or sooner if needed, also with Lab Appointment for testing done 3-5 days before at the [facility] (so this is for TWO appointments - please see the scheduling desk as you leave)  Due to the ongoing Covid 19 pandemic, our lab now requires an appointment for any labs done at our office.  If you need labs done and do not have an appointment, please call our office ahead of time to schedule before presenting to the lab for your testing.

## 2020-12-05 NOTE — Progress Notes (Signed)
Patient ID: Troy Parsons, male   DOB: July 27, 1960, 60 y.o.   MRN: [id number]        Chief Complaint: follow up HTN, HLD and hyperglycemia        HPI:  Troy Parsons is a 60 y.o. male here overall doing ok, Pt denies chest pain, increased sob or doe, wheezing, orthopnea, PND, increased LE swelling, palpitations, dizziness or syncope.   Pt denies polydipsia, polyuria, or new focal neuro s/s.  Has lost signifcant wt with better diet  .Having colonoscopy scheduled Nov 7.  S/p covid infection July 2022  Has lost wt intentionally with diet, exercise and ozempic.  Pt denies fever, wt loss, night sweats, loss of appetite, or other constitutional symptom  Taking vit d Wt Readings from Last 3 Encounters:  12/05/20 209 lb (94.8 kg)  12/04/20 210 lb (95.3 kg)  06/07/20 227 lb (103 kg)   BP Readings from Last 3 Encounters:  12/05/20 128/70  06/07/20 132/86  11/29/19 130/90         Past Medical History:  Diagnosis Date   Abnormal liver function    Depression    on meds   Esophageal reflux    on meds   Fatty liver    Gout    Hemorrhoids    Hiatal hernia    Hyperlipemia    on meds   Hypertension    on meds   Seasonal allergies    Type II or unspecified type diabetes mellitus without mention of complication, not stated as uncontrolled    on meds   Past Surgical History:  Procedure Laterality Date   APPENDECTOMY     NASAL SEPTUM SURGERY  8889   UMBILICAL HERNIA REPAIR  2009    reports that he has never smoked. He has never used smokeless tobacco. He reports current alcohol use of about 3.0 - 4.0 standard drinks per week. He reports that he does not use drugs. family history includes Cancer in an other family member; Colon cancer in an other family member; Diabetes in his mother and paternal grandfather; Heart disease in an other family member; Hyperlipidemia in an other family member; Hypertension in an other family member; Stroke in an other family member. Allergies  Allergen Reactions    Lipitor [Atorvastatin] Other (See Comments)    Increased liver enxymes   Current Outpatient Medications on File Prior to Visit  Medication Sig Dispense Refill   allopurinol (ZYLOPRIM) 300 MG tablet TAKE 1 TABLET(300 MG) BY MOUTH DAILY 90 tablet 3   ALPRAZolam (XANAX) 0.5 MG tablet Take 1 tablet (0.5 mg total) by mouth 2 (two) times daily as needed. 60 tablet 0   anastrozole (ARIMIDEX) 1 MG tablet Take 1 tablet by mouth once a week.     Ascorbic Acid (VITAMIN C) 500 MG CAPS Take 1 capsule by mouth daily at 6 (six) AM.     Cholecalciferol (VITAMIN D) 50 MCG (2000 UT) tablet Take 2,000 Units by mouth daily.     Continuous Blood Gluc Receiver (FREESTYLE LIBRE 14 DAY READER) DEVI Apply 1 Device topically 4 (four) times daily as needed. E11.9 1 Device 0   glucose blood (ACCU-CHEK AVIVA PLUS) test strip Use as instructed 100 each 0   ibuprofen (ADVIL) 800 MG tablet TAKE 1 TABLET BY MOUTH TWICE DAILY AS NEEDED 60 tablet 2   irbesartan (AVAPRO) 300 MG tablet Take 300 mg by mouth daily.     levothyroxine (SYNTHROID) 25 MCG tablet 1 tab by mouth once daily  90 tablet 3   metFORMIN (GLUCOPHAGE-XR) 500 MG 24 hr tablet 4 tab by mouth in the AM 360 tablet 3   omeprazole (PRILOSEC) 20 MG capsule Take 1 capsule (20 mg total) by mouth 2 (two) times daily. 180 capsule 3   PEG-KCl-NaCl-NaSulf-Na Asc-C (PLENVU) 140 g SOLR Take 1 kit by mouth as directed. Manufacturer's coupon Universal coupon code:BIN: P2366821; GROUP: [id number]; PCN: [id number]; ID: [id number]; PAY NO MORE $50; NO prior authorization 1 each 0   Semaglutide,0.25 or 0.5MG/DOS, (OZEMPIC, 0.25 OR 0.5 MG/DOSE,) 2 MG/1.5ML SOPN INJECT 0.5 MG INTO THE SKIN ONCE WEEKLY 4.5 mL 3   sildenafil (VIAGRA) 100 MG tablet TAKE 1/2 TO 1 TABLET(50 TO 100 MG) BY MOUTH DAILY AS NEEDED FOR ERECTILE DYSFUNCTION 5 tablet 11   simvastatin (ZOCOR) 40 MG tablet TAKE 1 TABLET(40 MG) BY MOUTH AT BEDTIME 90 tablet 3   Testosterone Cypionate 200 MG/ML SOLN Inject 1 Dose as directed  once a week.     Zinc 100 MG TABS Take 1 tablet by mouth daily at 6 (six) AM.     zolpidem (AMBIEN) 10 MG tablet TAKE 1 TABLET(10 MG) BY MOUTH AT BEDTIME AS NEEDED FOR SLEEP 30 tablet 5   No current facility-administered medications on file prior to visit.        ROS:  All others reviewed and negative.  Objective        PE:  BP 128/70 (BP Location: Right Arm, Patient Position: Sitting, Cuff Size: Large)   Pulse 70   Ht 5' 9" (1.753 m)   Wt 209 lb (94.8 kg)   SpO2 96%   BMI 30.86 kg/m                 Constitutional: Pt appears in NAD               HENT: Head: NCAT.                Right Ear: External ear normal.                 Left Ear: External ear normal.                Eyes: . Pupils are equal, round, and reactive to light. Conjunctivae and EOM are normal               Nose: without d/c or deformity               Neck: Neck supple. Gross normal ROM               Cardiovascular: Normal rate and regular rhythm.                 Pulmonary/Chest: Effort normal and breath sounds without rales or wheezing.                Abd:  Soft, NT, ND, + BS, no organomegaly               Neurological: Pt is alert. At baseline orientation, motor grossly intact               Skin: Skin is warm. No rashes, no other new lesions, LE edema - none               Psychiatric: Pt behavior is normal without agitation   Micro: none  Cardiac tracings I have personally interpreted today:  none  Pertinent Radiological findings (summarize): none   Lab Results  Component Value  Date   WBC 5.9 06/01/2020   HGB 14.2 06/01/2020   HCT 42.0 06/01/2020   PLT 215.0 06/01/2020   GLUCOSE 110 (H) 12/03/2020   CHOL 106 12/03/2020   TRIG 167.0 (H) 12/03/2020   HDL 36.20 (L) 12/03/2020   LDLDIRECT 57.0 11/23/2019   LDLCALC 36 12/03/2020   ALT 37 12/03/2020   AST 30 12/03/2020   NA 139 12/03/2020   K 3.7 12/03/2020   CL 103 12/03/2020   CREATININE 0.93 12/03/2020   BUN 15 12/03/2020   CO2 26 12/03/2020    TSH 2.78 06/01/2020   PSA 0.75 06/01/2020   INR 1.0 03/29/2010   HGBA1C 6.4 12/03/2020   MICROALBUR 1.0 06/01/2020   Assessment/Plan:  Troy Parsons is a 60 y.o. White or Caucasian [1] male with  has a past medical history of Abnormal liver function, Depression, Esophageal reflux, Fatty liver, Gout, Hemorrhoids, Hiatal hernia, Hyperlipemia, Hypertension, Seasonal allergies, and Type II or unspecified type diabetes mellitus without mention of complication, not stated as uncontrolled.  Type 2 diabetes mellitus ([place]) Lab Results  Component Value Date   HGBA1C 6.4 12/03/2020   Now well conrolled, likely to be overtreating soon with further wt loss,  pt to decrease medical treatment glipizide xl 5 mg   Mixed hyperlipidemia Lab Results  Component Value Date   LDLCALC 36 12/03/2020   Stable, pt to continue current statin zocor 40   Hypertension BP Readings from Last 3 Encounters:  12/05/20 128/70  06/07/20 132/86  11/29/19 130/90   Stable, pt to continue medical treatment avapro   Vitamin D deficiency Last vitamin D Lab Results  Component Value Date   VD25OH 75.37 12/03/2020   Stable, cont oral replacement  Followup: Return in about 6 months (around 06/05/2021).  Cathlean Cower, MD 12/11/2020 10:44 PM [facility]

## 2020-12-11 ENCOUNTER — Encounter: Payer: Self-pay | Admitting: Internal Medicine

## 2020-12-11 NOTE — Assessment & Plan Note (Signed)
Lab Results  Component Value Date   LDLCALC 36 12/03/2020   Stable, pt to continue current statin zocor 40

## 2020-12-11 NOTE — Assessment & Plan Note (Signed)
Lab Results  Component Value Date   HGBA1C 6.4 12/03/2020   Now well conrolled, likely to be overtreating soon with further wt loss,  pt to decrease medical treatment glipizide xl 5 mg

## 2020-12-11 NOTE — Assessment & Plan Note (Signed)
Last vitamin D Lab Results  Component Value Date   VD25OH 75.37 12/03/2020   Stable, cont oral replacement

## 2020-12-11 NOTE — Assessment & Plan Note (Signed)
BP Readings from Last 3 Encounters:  12/05/20 128/70  06/07/20 132/86  11/29/19 130/90   Stable, pt to continue medical treatment avapro

## 2020-12-17 ENCOUNTER — Ambulatory Visit (AMBULATORY_SURGERY_CENTER): Payer: 59 | Admitting: Gastroenterology

## 2020-12-17 ENCOUNTER — Other Ambulatory Visit: Payer: Self-pay

## 2020-12-17 ENCOUNTER — Encounter: Payer: Self-pay | Admitting: Gastroenterology

## 2020-12-17 VITALS — BP 120/74 | HR 75 | Temp 98.0°F | Resp 19 | Ht 69.0 in | Wt 210.0 lb

## 2020-12-17 DIAGNOSIS — Z1211 Encounter for screening for malignant neoplasm of colon: Secondary | ICD-10-CM

## 2020-12-17 DIAGNOSIS — D123 Benign neoplasm of transverse colon: Secondary | ICD-10-CM | POA: Diagnosis not present

## 2020-12-17 MED ORDER — SODIUM CHLORIDE 0.9 % IV SOLN: 500.0000 mL | Freq: Once | INTRAVENOUS | Status: DC

## 2020-12-17 NOTE — Progress Notes (Signed)
Referring Provider: Biagio Borg, MD Primary Care Physician:  Biagio Borg, MD  Reason for Procedure:  Colon cancer screening   IMPRESSION:  Need for colon cancer screening Appropriate candidate for monitored anesthesia care  PLAN: Colonoscopy in the [place] today   HPI: Troy Parsons is a 60 y.o. male presents for screening colonoscopy.  Prior colonoscopy with Dr. Sharlett Iles in 2012.   No baseline GI symptoms.   No known family history of colon cancer or polyps. No family history of uterine/endometrial cancer, pancreatic cancer or gastric/stomach cancer.   Past Medical History:  Diagnosis Date   Abnormal liver function    Depression    on meds   Esophageal reflux    on meds   Fatty liver    Gout    Hemorrhoids    Hiatal hernia    Hyperlipemia    on meds   Hypertension    on meds   Seasonal allergies    Type II or unspecified type diabetes mellitus without mention of complication, not stated as uncontrolled    on meds    Past Surgical History:  Procedure Laterality Date   APPENDECTOMY     NASAL SEPTUM SURGERY  7829   UMBILICAL HERNIA REPAIR  2009    Current Outpatient Medications  Medication Sig Dispense Refill   anastrozole (ARIMIDEX) 1 MG tablet Take 1 tablet by mouth once a week.     Continuous Blood Gluc Receiver (FREESTYLE LIBRE 14 DAY READER) DEVI Apply 1 Device topically 4 (four) times daily as needed. E11.9 1 Device 0   Continuous Blood Gluc Sensor (FREESTYLE LIBRE 3 SENSOR) MISC Apply 1 application topically every 14 (fourteen) days. Place 1 sensor on the skin every 14 days. E11.9 6 each 3   ibuprofen (ADVIL) 800 MG tablet TAKE 1 TABLET BY MOUTH TWICE DAILY AS NEEDED 60 tablet 2   irbesartan (AVAPRO) 300 MG tablet Take 300 mg by mouth daily.     Semaglutide,0.25 or 0.5MG /DOS, (OZEMPIC, 0.25 OR 0.5 MG/DOSE,) 2 MG/1.5ML SOPN INJECT 0.5 MG INTO THE SKIN ONCE WEEKLY 4.5 mL 3   Testosterone Cypionate 200 MG/ML SOLN Inject 1 Dose as directed once a  week.     allopurinol (ZYLOPRIM) 300 MG tablet TAKE 1 TABLET(300 MG) BY MOUTH DAILY 90 tablet 3   ALPRAZolam (XANAX) 0.5 MG tablet Take 1 tablet (0.5 mg total) by mouth 2 (two) times daily as needed. 60 tablet 0   Ascorbic Acid (VITAMIN C) 500 MG CAPS Take 1 capsule by mouth daily at 6 (six) AM.     Cholecalciferol (VITAMIN D) 50 MCG (2000 UT) tablet Take 2,000 Units by mouth daily.     glipiZIDE (GLUCOTROL XL) 5 MG 24 hr tablet Take 1 tablet (5 mg total) by mouth daily with breakfast. 90 tablet 3   glucose blood (ACCU-CHEK AVIVA PLUS) test strip Use as instructed 100 each 0   levothyroxine (SYNTHROID) 25 MCG tablet 1 tab by mouth once daily 90 tablet 3   metFORMIN (GLUCOPHAGE-XR) 500 MG 24 hr tablet 4 tab by mouth in the AM 360 tablet 3   omeprazole (PRILOSEC) 20 MG capsule Take 1 capsule (20 mg total) by mouth 2 (two) times daily. 180 capsule 3   sildenafil (VIAGRA) 100 MG tablet TAKE 1/2 TO 1 TABLET(50 TO 100 MG) BY MOUTH DAILY AS NEEDED FOR ERECTILE DYSFUNCTION 5 tablet 11   simvastatin (ZOCOR) 40 MG tablet TAKE 1 TABLET(40 MG) BY MOUTH AT BEDTIME 90 tablet 3  Zinc 100 MG TABS Take 1 tablet by mouth daily at 6 (six) AM.     zolpidem (AMBIEN) 10 MG tablet TAKE 1 TABLET(10 MG) BY MOUTH AT BEDTIME AS NEEDED FOR SLEEP 30 tablet 5   Current Facility-Administered Medications  Medication Dose Route Frequency Provider Last Rate Last Admin   0.9 %  sodium chloride infusion  500 mL Intravenous Once Thornton Park, MD        Allergies as of 12/17/2020 - Review Complete 12/17/2020  Allergen Reaction Noted   Lipitor [atorvastatin] Other (See Comments) 01/15/2012    Family History  Problem Relation Age of Onset   Diabetes Mother        most members of Maternal   Diabetes Paternal Grandfather    Colon cancer Other        neg. hx.   Heart disease Other    Hyperlipidemia Other    Hypertension Other    Stroke Other    Cancer Other    Esophageal cancer Neg Hx    Stomach cancer Neg Hx     Rectal cancer Neg Hx      Physical Exam: General:   Alert,  well-nourished, pleasant and cooperative in NAD Head:  Normocephalic and atraumatic. Eyes:  Sclera clear, no icterus.   Conjunctiva pink. Mouth:  No deformity or lesions.   Neck:  Supple; no masses or thyromegaly. Lungs:  Clear throughout to auscultation.   No wheezes. Heart:  Regular rate and rhythm; no murmurs. Abdomen:  Soft, non-tender, nondistended, normal bowel sounds, no rebound or guarding.  Msk:  Symmetrical. No boney deformities LAD: No inguinal or umbilical LAD Extremities:  No clubbing or edema. Neurologic:  Alert and  oriented x4;  grossly nonfocal Skin:  No obvious rash or bruise. Psych:  Alert and cooperative. Normal mood and affect.     Jessicamarie Amiri L. Tarri Glenn, MD, MPH 12/17/2020, 10:33 AM

## 2020-12-17 NOTE — Op Note (Signed)
[facility] Patient Name: Troy Parsons Procedure Date: 12/17/2020 10:32 AM MRN: [id number] Endoscopist: Thornton Park MD, MD Age: 60 Referring MD:  Date of Birth: 10/24/1960 Gender: Male Account #: [id number] Procedure:                Colonoscopy Indications:              Screening for colorectal malignant neoplasm, This                            is the patient's first colonoscopy                           Normal colonoscopy 2012 with Dr. Sharlett Iles Medicines:                Monitored Anesthesia Care Procedure:                Pre-Anesthesia Assessment:                           - Prior to the procedure, a History and Physical                            was performed, and patient medications and                            allergies were reviewed. The patient's tolerance of                            previous anesthesia was also reviewed. The risks                            and benefits of the procedure and the sedation                            options and risks were discussed with the patient.                            All questions were answered, and informed consent                            was obtained. Prior Anticoagulants: The patient has                            taken no previous anticoagulant or antiplatelet                            agents. ASA Grade Assessment: II - A patient with                            mild systemic disease. After reviewing the risks                            and benefits, the patient was deemed in  satisfactory condition to undergo the procedure.                           After obtaining informed consent, the colonoscope                            was passed under direct vision. Throughout the                            procedure, the patient's blood pressure, pulse, and                            oxygen saturations were monitored continuously. The                            Colonoscope was introduced  through the anus and                            advanced to the 3 cm into the ileum. A second                            forward view of the right colon was performed. The                            colonoscopy was performed without difficulty. The                            patient tolerated the procedure well. The quality                            of the bowel preparation was good. The terminal                            ileum, ileocecal valve, appendiceal orifice, and                            rectum were photographed. Scope In: 10:44:58 AM Scope Out: 10:58:46 AM Scope Withdrawal Time: 0 hours 9 minutes 17 seconds  Total Procedure Duration: 0 hours 13 minutes 48 seconds  Findings:                 The perianal and digital rectal examinations were                            normal.                           A 3 mm polyp was found in the hepatic flexure. The                            polyp was sessile. The polyp was removed with a                            cold snare. Resection and retrieval were complete.  Estimated blood loss was minimal.                           A 3 mm polyp was found in the transverse colon. The                            polyp was sessile. The polyp was removed with a                            cold snare. Resection and retrieval were complete.                            Estimated blood loss was minimal.                           The exam was otherwise without abnormality on                            direct and retroflexion views except for internal                            hemorrhoids. Complications:            No immediate complications. Estimated blood loss:                            Minimal. Estimated Blood Loss:     Estimated blood loss was minimal. Impression:               - One 3 mm polyp at the hepatic flexure, removed                            with a cold snare. Resected and retrieved.                           - One 3  mm polyp in the transverse colon, removed                            with a cold snare. Resected and retrieved.                           - Small internal hemorrhoids.                           - The examination was otherwise normal on direct                            and retroflexion views. Recommendation:           - Patient has a contact number available for                            emergencies. The signs and symptoms of potential  delayed complications were discussed with the                            patient. Return to normal activities tomorrow.                            Written discharge instructions were provided to the                            patient.                           - Resume previous diet.                           - Continue present medications.                           - Await pathology results.                           - Repeat colonoscopy date to be determined after                            pending pathology results are reviewed for                            surveillance.                           - Emerging evidence supports eating a diet of                            fruits, vegetables, grains, calcium, and yogurt                            while reducing red meat and alcohol may reduce the                            risk of colon cancer.                           - Thank you for allowing me to be involved in your                            colon cancer prevention. Thornton Park MD, MD 12/17/2020 11:02:46 AM This report has been signed electronically.

## 2020-12-17 NOTE — Progress Notes (Signed)
Pt's states no medical or surgical changes since previsit or office visit. Vitals done by [facility].

## 2020-12-17 NOTE — Patient Instructions (Signed)
YOU HAD AN ENDOSCOPIC PROCEDURE TODAY AT THE [facility]:   Refer to the procedure report that was given to you for any specific questions about what was found during the examination.  If the procedure report does not answer your questions, please call your gastroenterologist to clarify.  If you requested that your care partner not be given the details of your procedure findings, then the procedure report has been included in a sealed envelope for you to review at your convenience later.  YOU SHOULD EXPECT: Some feelings of bloating in the abdomen. Passage of more gas than usual.  Walking can help get rid of the air that was put into your GI tract during the procedure and reduce the bloating. If you had a lower endoscopy (such as a colonoscopy or flexible sigmoidoscopy) you may notice spotting of blood in your stool or on the toilet paper. If you underwent a bowel prep for your procedure, you may not have a normal bowel movement for a few days.  Please Note:  You might notice some irritation and congestion in your nose or some drainage.  This is from the oxygen used during your procedure.  There is no need for concern and it should clear up in a day or so.  SYMPTOMS TO REPORT IMMEDIATELY:   Following lower endoscopy (colonoscopy or flexible sigmoidoscopy):  Excessive amounts of blood in the stool  Significant tenderness or worsening of abdominal pains  Swelling of the abdomen that is new, acute  Fever of 100F or higher   Following upper endoscopy (EGD)  Vomiting of blood or coffee ground material  New chest pain or pain under the shoulder blades  Painful or persistently difficult swallowing  New shortness of breath  Fever of 100F or higher  Black, tarry-looking stools  For urgent or emergent issues, a gastroenterologist can be reached at any hour by calling [phone]. Do not use MyChart messaging for urgent concerns.    DIET:  We do recommend a small meal at first, but  then you may proceed to your regular diet.  Drink plenty of fluids but you should avoid alcoholic beverages for 24 hours.  ACTIVITY:  You should plan to take it easy for the rest of today and you should NOT DRIVE or use heavy machinery until tomorrow (because of the sedation medicines used during the test).    FOLLOW UP: Our staff will call the number listed on your records 48-72 hours following your procedure to check on you and address any questions or concerns that you may have regarding the information given to you following your procedure. If we do not reach you, we will leave a message.  We will attempt to reach you two times.  During this call, we will ask if you have developed any symptoms of COVID 19. If you develop any symptoms (ie: fever, flu-like symptoms, shortness of breath, cough etc.) before then, please call [phone].  If you test positive for Covid 19 in the 2 weeks post procedure, please call and report this information to us.    If any biopsies were taken you will be contacted by phone or by letter within the next 1-3 weeks.  Please call us at [phone] if you have not heard about the biopsies in 3 weeks.    SIGNATURES/CONFIDENTIALITY: You and/or your care partner have signed paperwork which will be entered into your electronic medical record.  These signatures attest to the fact that that the information above on   your After Visit Summary has been reviewed and is understood.  Full responsibility of the confidentiality of this discharge information lies with you and/or your care-partner. 

## 2020-12-17 NOTE — Progress Notes (Signed)
Report to PACU, RN, vss, BBS= Clear.  

## 2020-12-19 ENCOUNTER — Telehealth: Payer: Self-pay

## 2020-12-19 NOTE — Telephone Encounter (Signed)
  Follow up Call-  Call back number 12/17/2020  Post procedure Call Back phone  # [phone]  Permission to leave phone message Yes  Some recent data might be hidden     Patient questions:  Do you have a fever, pain , or abdominal swelling? No. Pain Score  0 *  Have you tolerated food without any problems? Yes.    Have you been able to return to your normal activities? Yes.    Do you have any questions about your discharge instructions: Diet   No. Medications  No. Follow up visit  No.  Do you have questions or concerns about your Care? No.  Actions: * If pain score is 4 or above: No action needed, pain <4.

## 2020-12-21 ENCOUNTER — Encounter: Payer: Self-pay | Admitting: Gastroenterology

## 2021-02-13 ENCOUNTER — Other Ambulatory Visit: Payer: Self-pay | Admitting: Internal Medicine

## 2021-03-02 ENCOUNTER — Encounter: Payer: Self-pay | Admitting: Internal Medicine

## 2021-03-03 ENCOUNTER — Other Ambulatory Visit: Payer: Self-pay | Admitting: Internal Medicine

## 2021-03-03 NOTE — Telephone Encounter (Signed)
Please refill as per office routine med refill policy (all routine meds to be refilled for 3 mo or monthly (per pt preference) up to one year from last visit, then month to month grace period for 3 mo, then further med refills will have to be denied) ? ?

## 2021-03-04 MED ORDER — TRULICITY 1.5 MG/0.5ML ~~LOC~~ SOAJ: 1.5000 mg | SUBCUTANEOUS | 6 refills | Status: DC

## 2021-03-05 ENCOUNTER — Other Ambulatory Visit: Payer: Self-pay | Admitting: Internal Medicine

## 2021-03-05 ENCOUNTER — Other Ambulatory Visit (HOSPITAL_COMMUNITY): Payer: Self-pay

## 2021-03-05 MED ORDER — OZEMPIC (0.25 OR 0.5 MG/DOSE) 2 MG/1.5ML ~~LOC~~ SOPN
0.5000 mg | PEN_INJECTOR | SUBCUTANEOUS | 1 refills | Status: DC
  Filled 2021-03-05: qty 4.5, 84d supply, fill #0
  Filled 2021-03-05: qty 1.5, 28d supply, fill #0

## 2021-03-27 ENCOUNTER — Other Ambulatory Visit: Payer: Self-pay | Admitting: Internal Medicine

## 2021-03-28 ENCOUNTER — Other Ambulatory Visit: Payer: Self-pay | Admitting: Internal Medicine

## 2021-03-28 ENCOUNTER — Other Ambulatory Visit (HOSPITAL_COMMUNITY): Payer: Self-pay

## 2021-03-29 ENCOUNTER — Other Ambulatory Visit (HOSPITAL_COMMUNITY): Payer: Self-pay

## 2021-04-28 ENCOUNTER — Other Ambulatory Visit: Payer: Self-pay | Admitting: Internal Medicine

## 2021-05-27 ENCOUNTER — Other Ambulatory Visit: Payer: Self-pay | Admitting: Internal Medicine

## 2021-05-29 LAB — HM DIABETES EYE EXAM

## 2021-05-30 ENCOUNTER — Other Ambulatory Visit (INDEPENDENT_AMBULATORY_CARE_PROVIDER_SITE_OTHER): Payer: 59

## 2021-05-30 DIAGNOSIS — Z0000 Encounter for general adult medical examination without abnormal findings: Secondary | ICD-10-CM | POA: Diagnosis not present

## 2021-05-30 DIAGNOSIS — E538 Deficiency of other specified B group vitamins: Secondary | ICD-10-CM | POA: Diagnosis not present

## 2021-05-30 DIAGNOSIS — E1165 Type 2 diabetes mellitus with hyperglycemia: Secondary | ICD-10-CM | POA: Diagnosis not present

## 2021-05-30 DIAGNOSIS — E559 Vitamin D deficiency, unspecified: Secondary | ICD-10-CM | POA: Diagnosis not present

## 2021-05-30 LAB — BASIC METABOLIC PANEL
BUN: 19 mg/dL (ref 6–23)
CO2: 27 mEq/L (ref 19–32)
Calcium: 9.5 mg/dL (ref 8.4–10.5)
Chloride: 104 mEq/L (ref 96–112)
Creatinine, Ser: 0.95 mg/dL (ref 0.40–1.50)
GFR: 88.04 mL/min (ref >60.00)
Glucose, Bld: 98 mg/dL (ref 70–99)
Potassium: 4.4 mEq/L (ref 3.5–5.1)
Sodium: 140 mEq/L (ref 135–145)

## 2021-05-30 LAB — MICROALBUMIN / CREATININE URINE RATIO
Creatinine,U: 257 mg/dL
Microalb Creat Ratio: 0.5 mg/g (ref 0.0–30.0)
Microalb, Ur: 1.2 mg/dL (ref 0.0–1.9)

## 2021-05-30 LAB — VITAMIN D 25 HYDROXY (VIT D DEFICIENCY, FRACTURES): VITD: 75.34 ng/mL (ref 30.00–100.00)

## 2021-05-30 LAB — HEPATIC FUNCTION PANEL
ALT: 21 U/L (ref 0–53)
AST: 27 U/L (ref 0–37)
Albumin: 4.4 g/dL (ref 3.5–5.2)
Alkaline Phosphatase: 38 U/L — ABNORMAL LOW (ref 39–117)
Bilirubin, Direct: 0.2 mg/dL (ref 0.0–0.3)
Total Bilirubin: 0.7 mg/dL (ref 0.2–1.2)
Total Protein: 6.3 g/dL (ref 6.0–8.3)

## 2021-05-30 LAB — PSA: PSA: 1.01 ng/mL (ref 0.10–4.00)

## 2021-05-30 LAB — CBC WITH DIFFERENTIAL/PLATELET
Basophils Absolute: 0 10*3/uL (ref 0.0–0.1)
Basophils Relative: 0.7 % (ref 0.0–3.0)
Eosinophils Absolute: 0.1 10*3/uL (ref 0.0–0.7)
Eosinophils Relative: 2 % (ref 0.0–5.0)
HCT: 46.2 % (ref 39.0–52.0)
Hemoglobin: 15.2 g/dL (ref 13.0–17.0)
Lymphocytes Relative: 36.1 % (ref 12.0–46.0)
Lymphs Abs: 1.8 10*3/uL (ref 0.7–4.0)
MCHC: 33 g/dL (ref 30.0–36.0)
MCV: 87.3 fl (ref 78.0–100.0)
Monocytes Absolute: 0.5 10*3/uL (ref 0.1–1.0)
Monocytes Relative: 10.8 % (ref 3.0–12.0)
Neutro Abs: 2.5 10*3/uL (ref 1.4–7.7)
Neutrophils Relative %: 50.4 % (ref 43.0–77.0)
Platelets: 219 10*3/uL (ref 150.0–400.0)
RBC: 5.29 Mil/uL (ref 4.22–5.81)
RDW: 16.1 % — ABNORMAL HIGH (ref 11.5–15.5)
WBC: 4.9 10*3/uL (ref 4.0–10.5)

## 2021-05-30 LAB — URINALYSIS, ROUTINE W REFLEX MICROSCOPIC
Bilirubin Urine: NEGATIVE
Hgb urine dipstick: NEGATIVE
Leukocytes,Ua: NEGATIVE
Nitrite: NEGATIVE
RBC / HPF: NONE SEEN (ref 0–?)
Specific Gravity, Urine: 1.02 (ref 1.000–1.030)
Total Protein, Urine: NEGATIVE
Urine Glucose: NEGATIVE
Urobilinogen, UA: 0.2 (ref 0.0–1.0)
pH: 6 (ref 5.0–8.0)

## 2021-05-30 LAB — HEMOGLOBIN A1C: Hgb A1c MFr Bld: 6 % (ref 4.6–6.5)

## 2021-05-30 LAB — LIPID PANEL
Cholesterol: 103 mg/dL (ref 0–200)
HDL: 45.1 mg/dL (ref >39.00)
LDL Cholesterol: 37 mg/dL (ref 0–99)
NonHDL: 57.81
Total CHOL/HDL Ratio: 2
Triglycerides: 103 mg/dL (ref 0.0–149.0)
VLDL: 20.6 mg/dL (ref 0.0–40.0)

## 2021-05-30 LAB — VITAMIN B12: Vitamin B-12: 1497 pg/mL — ABNORMAL HIGH (ref 211–911)

## 2021-05-30 LAB — TSH: TSH: 4.64 u[IU]/mL (ref 0.35–5.50)

## 2021-06-02 ENCOUNTER — Other Ambulatory Visit: Payer: Self-pay | Admitting: Internal Medicine

## 2021-06-02 NOTE — Telephone Encounter (Signed)
Please refill as per office routine med refill policy (all routine meds to be refilled for 3 mo or monthly (per pt preference) up to one year from last visit, then month to month grace period for 3 mo, then further med refills will have to be denied) ? ?

## 2021-06-05 ENCOUNTER — Other Ambulatory Visit: Payer: Self-pay | Admitting: Internal Medicine

## 2021-06-05 MED ORDER — SEMAGLUTIDE (1 MG/DOSE) 4 MG/3ML ~~LOC~~ SOPN: 1.0000 mg | PEN_INJECTOR | SUBCUTANEOUS | 1 refills | Status: DC

## 2021-06-07 ENCOUNTER — Encounter: Payer: Self-pay | Admitting: Internal Medicine

## 2021-06-07 ENCOUNTER — Ambulatory Visit (INDEPENDENT_AMBULATORY_CARE_PROVIDER_SITE_OTHER): Payer: 59 | Admitting: Internal Medicine

## 2021-06-07 VITALS — BP 122/70 | HR 61 | Temp 97.9°F | Ht 69.0 in | Wt 192.2 lb

## 2021-06-07 DIAGNOSIS — E782 Mixed hyperlipidemia: Secondary | ICD-10-CM

## 2021-06-07 DIAGNOSIS — I10 Essential (primary) hypertension: Secondary | ICD-10-CM | POA: Diagnosis not present

## 2021-06-07 DIAGNOSIS — Z0001 Encounter for general adult medical examination with abnormal findings: Secondary | ICD-10-CM | POA: Diagnosis not present

## 2021-06-07 DIAGNOSIS — E039 Hypothyroidism, unspecified: Secondary | ICD-10-CM

## 2021-06-07 DIAGNOSIS — E1165 Type 2 diabetes mellitus with hyperglycemia: Secondary | ICD-10-CM | POA: Diagnosis not present

## 2021-06-07 DIAGNOSIS — E538 Deficiency of other specified B group vitamins: Secondary | ICD-10-CM

## 2021-06-07 DIAGNOSIS — Z7184 Encounter for health counseling related to travel: Secondary | ICD-10-CM

## 2021-06-07 DIAGNOSIS — E559 Vitamin D deficiency, unspecified: Secondary | ICD-10-CM

## 2021-06-07 MED ORDER — TYPHOID VACCINE PO CPDR: 1.0000 | DELAYED_RELEASE_CAPSULE | ORAL | 0 refills | Status: DC

## 2021-06-07 MED ORDER — MEFLOQUINE HCL 250 MG PO TABS: 250.0000 mg | ORAL_TABLET | ORAL | 0 refills | Status: AC

## 2021-06-07 NOTE — Assessment & Plan Note (Signed)
Last vitamin D ?Lab Results  ?Component Value Date  ? VD25OH 75.34 05/30/2021  ? ?Stable, cont oral replacement ? ?

## 2021-06-07 NOTE — Patient Instructions (Addendum)
We can do lab testing for the Hep A and B and Measles - you can go to the [facility] lab for this at any time (or today downstairs) ? ?You are given the Rx for malaria and typhoid -  ? ?[facility] for the Yellow Fever and possible Rabies ? ?Ok to stop the glipizide ? ?Ok to cut back on the B12 supplement to every other day ? ?Please continue all other medications as before, and refills have been done if requested. ? ?Please have the pharmacy call with any other refills you may need. ? ?Please continue your efforts at being more active, low cholesterol diet, and weight control. ? ?You are otherwise up to date with prevention measures today. ? ?Please keep your appointments with your specialists as you may have planned ? ?Please make an Appointment to return in 6 months, or sooner if needed, also with Lab Appointment for testing done 3-5 days before at the [facility] (so this is for TWO appointments - please see the scheduling desk as you leave) ? ?Due to the ongoing Covid 19 pandemic, our lab now requires an appointment for any labs done at our office.  If you need labs done and do not have an appointment, please call our office ahead of time to schedule before presenting to the lab for your testing. ? ? ? ? ?

## 2021-06-07 NOTE — Progress Notes (Signed)
Patient ID: Troy Parsons, male   DOB: 07-08-1960, 61 y.o.   MRN: [id number] ? ? ? ?     Chief Complaint:: wellness exam and DM, low b12,  hld, htn, low thyorid, low Vit 3 ? ?     HPI:  Troy Parsons is a 61 y.o. male here for wellness exam; already up to date ?              Also Pt denies chest pain, increased sob or doe, wheezing, orthopnea, PND, increased LE swelling, palpitations, dizziness or syncope.   Pt denies polydipsia, polyuria, or new focal neuro s/s.  CBGs have been lower than 100 at times despite no symptoms.  Pt denies fever, wt loss, night sweats, loss of appetite, or other constitutional symptoms  Taking B12 daily and the Vit d.  Has travel needs for travel to [place] in august 2023 ? ?Wt Readings from Last 3 Encounters:  ?06/07/21 192 lb 3.2 oz (87.2 kg)  ?12/17/20 210 lb (95.3 kg)  ?12/05/20 209 lb (94.8 kg)  ? ?BP Readings from Last 3 Encounters:  ?06/07/21 122/70  ?12/17/20 120/74  ?12/05/20 128/70  ? ?Immunization History  ?Administered Date(s) Administered  ? Influenza Whole 11/22/2007, 12/18/2008  ? Influenza,inj,Quad PF,6+ Mos 01/19/2013, 12/13/2017, 10/27/2018, 11/29/2019, 12/05/2020  ? Influenza-Unspecified 11/28/2015, 10/30/2016, 12/11/2017  ? MMR 06/16/2017  ? PFIZER(Purple Top)SARS-COV-2 Vaccination 04/06/2019, 05/04/2019, 12/29/2019  ? Pneumococcal Conjugate-13 03/30/2015  ? Pneumococcal Polysaccharide-23 04/16/2017  ? Td 12/29/2008  ? Tdap 06/07/2020  ? Zoster Recombinat (Shingrix) 08/27/2020, 11/19/2020  ? ?There are no preventive care reminders to display for this patient. ? ?  ? ?Past Medical History:  ?Diagnosis Date  ? Abnormal liver function   ? Depression   ? on meds  ? Esophageal reflux   ? on meds  ? Fatty liver   ? Gout   ? Hemorrhoids   ? Hiatal hernia   ? Hyperlipemia   ? on meds  ? Hypertension   ? on meds  ? Seasonal allergies   ? Type II or unspecified type diabetes mellitus without mention of complication, not stated as uncontrolled   ? on meds  ? ?Past Surgical  History:  ?Procedure Laterality Date  ? APPENDECTOMY    ? NASAL SEPTUM SURGERY  2010  ? UMBILICAL HERNIA REPAIR  2009  ? ? reports that he has never smoked. He has never used smokeless tobacco. He reports current alcohol use of about 3.0 - 4.0 standard drinks per week. He reports that he does not use drugs. ?family history includes Cancer in an other family member; Colon cancer in an other family member; Diabetes in his mother and paternal grandfather; Heart disease in an other family member; Hyperlipidemia in an other family member; Hypertension in an other family member; Stroke in an other family member. ?Allergies  ?Allergen Reactions  ? Lipitor [Atorvastatin] Other (See Comments)  ?  Increased liver enxymes  ? ?Current Outpatient Medications on File Prior to Visit  ?Medication Sig Dispense Refill  ? allopurinol (ZYLOPRIM) 300 MG tablet TAKE 1 TABLET(300 MG) BY MOUTH DAILY 90 tablet 3  ? ALPRAZolam (XANAX) 0.5 MG tablet Take 1 tablet (0.5 mg total) by mouth 2 (two) times daily as needed. 60 tablet 0  ? anastrozole (ARIMIDEX) 1 MG tablet Take 1 tablet by mouth once a week.    ? Ascorbic Acid (VITAMIN C) 500 MG CAPS Take 1 capsule by mouth daily at 6 (six) AM.    ?  Cholecalciferol (VITAMIN D) 50 MCG (2000 UT) tablet Take 2,000 Units by mouth daily.    ? Continuous Blood Gluc Receiver (FREESTYLE LIBRE 14 DAY READER) DEVI Apply 1 Device topically 4 (four) times daily as needed. E11.9 1 Device 0  ? Continuous Blood Gluc Sensor (FREESTYLE LIBRE 3 SENSOR) MISC Apply 1 application topically every 14 (fourteen) days. Place 1 sensor on the skin every 14 days. E11.9 6 each 3  ? glipiZIDE (GLUCOTROL XL) 5 MG 24 hr tablet Take 1 tablet (5 mg total) by mouth daily with breakfast. 90 tablet 3  ? ibuprofen (ADVIL) 800 MG tablet TAKE 1 TABLET BY MOUTH TWICE DAILY AS NEEDED 60 tablet 2  ? irbesartan (AVAPRO) 300 MG tablet Take 300 mg by mouth daily.    ? levothyroxine (SYNTHROID) 25 MCG tablet TAKE 1 TABLET BY MOUTH EVERY DAY  Annual appt is due w/ labs must see provider for future refills 30 tablet 0  ? metFORMIN (GLUCOPHAGE-XR) 500 MG 24 hr tablet 4 tab by mouth in the AM 360 tablet 3  ? omeprazole (PRILOSEC) 20 MG capsule TAKE 1 CAPSULE(20 MG) BY MOUTH TWICE DAILY 180 capsule 3  ? Semaglutide, 1 MG/DOSE, 4 MG/3ML SOPN Inject 1 mg as directed once a week. 9 mL 1  ? sildenafil (VIAGRA) 100 MG tablet TAKE 1/2 TO 1 TABLET(50 TO 100 MG) BY MOUTH DAILY AS NEEDED FOR ERECTILE DYSFUNCTION 5 tablet 11  ? simvastatin (ZOCOR) 40 MG tablet TAKE 1 TABLET(40 MG) BY MOUTH AT BEDTIME 90 tablet 3  ? Testosterone Cypionate 200 MG/ML SOLN Inject 1 Dose as directed once a week.    ? Zinc 100 MG TABS Take 1 tablet by mouth daily at 6 (six) AM.    ? zolpidem (AMBIEN) 10 MG tablet TAKE 1 TABLET(10 MG) BY MOUTH AT BEDTIME AS NEEDED FOR SLEEP 90 tablet 1  ? ?No current facility-administered medications on file prior to visit.  ? ?     ROS:  All others reviewed and negative. ? ?Objective  ? ?     PE:  BP 122/70 (BP Location: Right Arm, Patient Position: Sitting, Cuff Size: Large)   Pulse 61   Temp 97.9 ?F (36.6 ?C) (Oral)   Ht _0  (1.753 m)   Wt 192 lb 3.2 oz (87.2 kg)   SpO2 98%   BMI 28.38 kg/m?  ? ?              Constitutional: Pt appears in NAD ?              HENT: Head: NCAT.  ?              Right Ear: External ear normal.   ?              Left Ear: External ear normal.  ?              Eyes: . Pupils are equal, round, and reactive to light. Conjunctivae and EOM are normal ?              Nose: without d/c or deformity ?              Neck: Neck supple. Gross normal ROM ?              Cardiovascular: Normal rate and regular rhythm.   ?              Pulmonary/Chest: Effort normal and breath sounds without rales or wheezing.  ?  Abd:  Soft, NT, ND, + BS, no organomegaly ?              Neurological: Pt is alert. At baseline orientation, motor grossly intact ?              Skin: Skin is warm. No rashes, no other new lesions, LE edema - none ?               Psychiatric: Pt behavior is normal without agitation  ? ?Micro: none ? ?Cardiac tracings I have personally interpreted today:  none ? ?Pertinent Radiological findings (summarize): none  ? ?Lab Results  ?Component Value Date  ? WBC 4.9 05/30/2021  ? HGB 15.2 05/30/2021  ? HCT 46.2 05/30/2021  ? PLT 219.0 05/30/2021  ? GLUCOSE 98 05/30/2021  ? CHOL 103 05/30/2021  ? TRIG 103.0 05/30/2021  ? HDL 45.10 05/30/2021  ? LDLDIRECT 57.0 11/23/2019  ? LDLCALC 37 05/30/2021  ? ALT 21 05/30/2021  ? AST 27 05/30/2021  ? NA 140 05/30/2021  ? K 4.4 05/30/2021  ? CL 104 05/30/2021  ? CREATININE 0.95 05/30/2021  ? BUN 19 05/30/2021  ? CO2 27 05/30/2021  ? TSH 4.64 05/30/2021  ? PSA 1.01 05/30/2021  ? INR 1.0 03/29/2010  ? HGBA1C 6.0 05/30/2021  ? MICROALBUR 1.2 05/30/2021  ? ?Assessment/Plan:  ?GURMAN ASHLAND is a 61 y.o. White or Caucasian [1] male with  has a past medical history of Abnormal liver function, Depression, Esophageal reflux, Fatty liver, Gout, Hemorrhoids, Hiatal hernia, Hyperlipemia, Hypertension, Seasonal allergies, and Type II or unspecified type diabetes mellitus without mention of complication, not stated as uncontrolled. ? ?Vitamin D deficiency ?Last vitamin D ?Lab Results  ?Component Value Date  ? VD25OH 75.34 05/30/2021  ? ?Stable, cont oral replacement ? ? ?Encounter for well adult exam with abnormal findings ?Age and sex appropriate education and counseling updated with regular exercise and diet ?Referrals for preventative services - none needed ?Immunizations addressed - none needed ?Smoking counseling  - none needed ?Evidence for depression or other mood disorder - none significant ?Most recent labs reviewed. ?I have personally reviewed and have noted: ?1) the patient's medical and social history ?2) The patient's current medications and supplements ?3) The patient's height, weight, and BMI have been recorded in the chart ? ? ?Type 2 diabetes mellitus ([place]) ?Lab Results  ?Component Value Date   ? HGBA1C 6.0 05/30/2021  ? ?Very well controlled, ok to dc the glipizide, pt to continue current medical treatment metformin and ozempic ? ? ?Mixed hyperlipidemia ?Lab Results  ?Component Value Date  ? LDLCAL

## 2021-06-08 ENCOUNTER — Encounter: Payer: Self-pay | Admitting: Internal Medicine

## 2021-06-08 DIAGNOSIS — E538 Deficiency of other specified B group vitamins: Secondary | ICD-10-CM | POA: Insufficient documentation

## 2021-06-08 DIAGNOSIS — Z7184 Encounter for health counseling related to travel: Secondary | ICD-10-CM | POA: Insufficient documentation

## 2021-06-08 NOTE — Assessment & Plan Note (Signed)
Lab Results  ?Component Value Date  ? VEXOGACG98 1,497 (H) 05/30/2021  ? ?Stable now overcontrolled, cont oral replacement at m-w-f only- b12 1000 mcg ? ?

## 2021-06-08 NOTE — Assessment & Plan Note (Signed)
BP Readings from Last 3 Encounters:  ?06/07/21 122/70  ?12/17/20 120/74  ?12/05/20 128/70  ? ?Stable, pt to continue medical treatment avapro ? ?

## 2021-06-08 NOTE — Assessment & Plan Note (Signed)
Lab Results  ?Component Value Date  ? HGBA1C 6.0 05/30/2021  ? ?Very well controlled, ok to dc the glipizide, pt to continue current medical treatment metformin and ozempic ? ?

## 2021-06-08 NOTE — Assessment & Plan Note (Signed)
We can do lab testing for the Hep A and B and Measles - you can go to the [facility] lab for this at any time (or today downstairs) ? ?You are given the Rx for malaria and typhoid -  ? ?[facility] to [facility] for the Yellow Fever and possible Rabies ?

## 2021-06-08 NOTE — Assessment & Plan Note (Signed)
Lab Results  ?Component Value Date  ? LDLCALC 37 05/30/2021  ? ?Stable, pt to continue current statin zocor 40 ? ?

## 2021-06-08 NOTE — Assessment & Plan Note (Signed)

## 2021-06-08 NOTE — Assessment & Plan Note (Signed)
Lab Results  ?Component Value Date  ? TSH 4.64 05/30/2021  ? ?Stable, pt to continue levothyroxine ? ?

## 2021-06-09 LAB — MEASLES/MUMPS/RUBELLA IMMUNITY
Mumps IgG: 13 AU/mL
Rubella: 12.7 Index
Rubeola IgG: 28.9 AU/mL

## 2021-06-09 LAB — HEPATITIS A ANTIBODY, TOTAL: Hepatitis A AB,Total: REACTIVE — AB

## 2021-06-09 LAB — HEPATITIS B SURFACE ANTIBODY,QUALITATIVE: Hep B S Ab: NONREACTIVE

## 2021-06-10 ENCOUNTER — Other Ambulatory Visit: Payer: Self-pay | Admitting: Internal Medicine

## 2021-06-10 NOTE — Telephone Encounter (Signed)
Please refill as per office routine med refill policy (all routine meds to be refilled for 3 mo or monthly (per pt preference) up to one year from last visit, then month to month grace period for 3 mo, then further med refills will have to be denied) ? ?

## 2021-08-15 ENCOUNTER — Encounter: Payer: Self-pay | Admitting: Internal Medicine

## 2021-08-16 MED ORDER — ALLOPURINOL 300 MG PO TABS: ORAL_TABLET | ORAL | 3 refills | Status: DC

## 2021-08-16 MED ORDER — ZOLPIDEM TARTRATE 10 MG PO TABS
ORAL_TABLET | ORAL | 1 refills | Status: DC
Start: 2021-08-16 — End: 2022-06-30

## 2021-08-16 NOTE — Telephone Encounter (Signed)
Patient requesting refill of Ambien.

## 2021-10-28 ENCOUNTER — Other Ambulatory Visit: Payer: Self-pay | Admitting: Internal Medicine

## 2021-11-03 ENCOUNTER — Other Ambulatory Visit: Payer: Self-pay | Admitting: Internal Medicine

## 2021-11-04 ENCOUNTER — Ambulatory Visit: Payer: 59 | Admitting: Podiatry

## 2021-11-04 MED ORDER — LEVOTHYROXINE SODIUM 25 MCG PO TABS: ORAL_TABLET | ORAL | 0 refills | Status: DC

## 2021-11-05 ENCOUNTER — Encounter: Payer: Self-pay | Admitting: Internal Medicine

## 2021-11-06 ENCOUNTER — Ambulatory Visit: Payer: 59 | Admitting: Podiatry

## 2021-11-10 ENCOUNTER — Encounter: Payer: Self-pay | Admitting: Internal Medicine

## 2021-11-11 ENCOUNTER — Ambulatory Visit (INDEPENDENT_AMBULATORY_CARE_PROVIDER_SITE_OTHER): Payer: 59 | Admitting: Podiatry

## 2021-11-11 DIAGNOSIS — B351 Tinea unguium: Secondary | ICD-10-CM

## 2021-11-11 MED ORDER — IRBESARTAN 300 MG PO TABS: 300.0000 mg | ORAL_TABLET | Freq: Every day | ORAL | 1 refills | Status: DC

## 2021-11-11 MED ORDER — TERBINAFINE HCL 250 MG PO TABS: 250.0000 mg | ORAL_TABLET | Freq: Every day | ORAL | 0 refills | Status: DC

## 2021-11-11 NOTE — Progress Notes (Signed)
Onycho b/l hallux - lamisil   Comp dm foot exam    Chief Complaint  Patient presents with   Ingrown Toenail    Patient is here for right foot great toe ingrown toe, patient also complains of toe nail fungus ( bilateral) he has not tried anything for the fungus, patient is diabetic.    Subjective: 61 y.o. male presenting today as a new patient for complaint of discoloration with thickening to the toenails 1-5 bilateral.  Patient states that he has had toenail fungus for several months now with no improvement.  He has not tried anything recently for treatment.  He is also diabetic requesting routine diabetic foot exam  Past Medical History:  Diagnosis Date   Abnormal liver function    Depression    on meds   Esophageal reflux    on meds   Fatty liver    Gout    Hemorrhoids    Hiatal hernia    Hyperlipemia    on meds   Hypertension    on meds   Seasonal allergies    Type II or unspecified type diabetes mellitus without mention of complication, not stated as uncontrolled    on meds    Past Surgical History:  Procedure Laterality Date   APPENDECTOMY     NASAL SEPTUM SURGERY  5102   UMBILICAL HERNIA REPAIR  2009    Allergies  Allergen Reactions   Lipitor [Atorvastatin] Other (See Comments)    Increased liver enxymes    Objective: Physical Exam General: The patient is alert and oriented x3 in no acute distress.  Dermatology: Hyperkeratotic, discolored, thickened, onychodystrophy noted bilateral. Skin is warm, dry and supple bilateral lower extremities. Negative for open lesions or macerations.  Vascular: Palpable pedal pulses bilaterally. No edema or erythema noted. Capillary refill within normal limits.  Neurological: Epicritic and protective threshold grossly intact bilaterally.   Musculoskeletal Exam: No pedal deformity noted  Assessment: #1 Onychomycosis of toenails #2 encounter for diabetic foot exam  Plan of Care:  #1 Patient was evaluated.   Comprehensive diabetic foot exam performed today #2  Today we discussed different treatment options including oral, topical, and laser antifungal treatment modalities.  We discussed their efficacies and side effects.  Patient opts for oral antifungal treatment modality #3 prescription for Lamisil 250 mg #90 daily.  Patient has routine blood work done and last labs 05/30/2021 hepatic function panel is WNL. #4 return to clinic 6 months    Edrick Kins, DPM [facility]  Dr. Edrick Kins, DPM    [address], [place] [address]                Office [phone]  Fax [phone]

## 2021-11-12 ENCOUNTER — Other Ambulatory Visit: Payer: Self-pay | Admitting: Internal Medicine

## 2021-12-02 ENCOUNTER — Other Ambulatory Visit: Payer: Self-pay | Admitting: Internal Medicine

## 2021-12-03 ENCOUNTER — Other Ambulatory Visit (INDEPENDENT_AMBULATORY_CARE_PROVIDER_SITE_OTHER): Payer: 59

## 2021-12-03 DIAGNOSIS — E538 Deficiency of other specified B group vitamins: Secondary | ICD-10-CM

## 2021-12-03 DIAGNOSIS — E559 Vitamin D deficiency, unspecified: Secondary | ICD-10-CM

## 2021-12-03 DIAGNOSIS — E1165 Type 2 diabetes mellitus with hyperglycemia: Secondary | ICD-10-CM | POA: Diagnosis not present

## 2021-12-03 LAB — BASIC METABOLIC PANEL
BUN: 19 mg/dL (ref 6–23)
CO2: 27 mEq/L (ref 19–32)
Calcium: 9.6 mg/dL (ref 8.4–10.5)
Chloride: 102 mEq/L (ref 96–112)
Creatinine, Ser: 0.84 mg/dL (ref 0.40–1.50)
GFR: 95.57 mL/min (ref >60.00)
Glucose, Bld: 112 mg/dL — ABNORMAL HIGH (ref 70–99)
Potassium: 4 mEq/L (ref 3.5–5.1)
Sodium: 137 mEq/L (ref 135–145)

## 2021-12-03 LAB — HEPATIC FUNCTION PANEL
ALT: 22 U/L (ref 0–53)
AST: 23 U/L (ref 0–37)
Albumin: 4.5 g/dL (ref 3.5–5.2)
Alkaline Phosphatase: 36 U/L — ABNORMAL LOW (ref 39–117)
Bilirubin, Direct: 0.2 mg/dL (ref 0.0–0.3)
Total Bilirubin: 0.8 mg/dL (ref 0.2–1.2)
Total Protein: 6.9 g/dL (ref 6.0–8.3)

## 2021-12-03 LAB — LIPID PANEL
Cholesterol: 120 mg/dL (ref 0–200)
HDL: 50.1 mg/dL (ref >39.00)
LDL Cholesterol: 36 mg/dL (ref 0–99)
NonHDL: 69.62
Total CHOL/HDL Ratio: 2
Triglycerides: 167 mg/dL — ABNORMAL HIGH (ref 0.0–149.0)
VLDL: 33.4 mg/dL (ref 0.0–40.0)

## 2021-12-03 LAB — VITAMIN D 25 HYDROXY (VIT D DEFICIENCY, FRACTURES): VITD: 68.27 ng/mL (ref 30.00–100.00)

## 2021-12-03 LAB — HEMOGLOBIN A1C: Hgb A1c MFr Bld: 6.3 % (ref 4.6–6.5)

## 2021-12-03 LAB — VITAMIN B12: Vitamin B-12: 1127 pg/mL — ABNORMAL HIGH (ref 211–911)

## 2021-12-09 ENCOUNTER — Ambulatory Visit: Payer: 59 | Admitting: Internal Medicine

## 2021-12-11 ENCOUNTER — Ambulatory Visit (INDEPENDENT_AMBULATORY_CARE_PROVIDER_SITE_OTHER): Payer: 59 | Admitting: Internal Medicine

## 2021-12-11 VITALS — BP 122/70 | HR 67 | Temp 97.9°F | Ht 69.0 in | Wt 190.0 lb

## 2021-12-11 DIAGNOSIS — I10 Essential (primary) hypertension: Secondary | ICD-10-CM | POA: Diagnosis not present

## 2021-12-11 DIAGNOSIS — E782 Mixed hyperlipidemia: Secondary | ICD-10-CM

## 2021-12-11 DIAGNOSIS — Z23 Encounter for immunization: Secondary | ICD-10-CM

## 2021-12-11 DIAGNOSIS — E559 Vitamin D deficiency, unspecified: Secondary | ICD-10-CM

## 2021-12-11 DIAGNOSIS — E1165 Type 2 diabetes mellitus with hyperglycemia: Secondary | ICD-10-CM | POA: Diagnosis not present

## 2021-12-11 DIAGNOSIS — E538 Deficiency of other specified B group vitamins: Secondary | ICD-10-CM

## 2021-12-11 DIAGNOSIS — Z125 Encounter for screening for malignant neoplasm of prostate: Secondary | ICD-10-CM

## 2021-12-11 DIAGNOSIS — R9431 Abnormal electrocardiogram [ECG] [EKG]: Secondary | ICD-10-CM

## 2021-12-11 MED ORDER — LEVOTHYROXINE SODIUM 25 MCG PO TABS: ORAL_TABLET | ORAL | 3 refills | Status: DC

## 2021-12-11 MED ORDER — IRBESARTAN 300 MG PO TABS: 300.0000 mg | ORAL_TABLET | Freq: Every day | ORAL | 3 refills | Status: DC

## 2021-12-11 NOTE — Progress Notes (Unsigned)
Patient ID: Troy Parsons, male   DOB: Dec 13, 1960, 61 y.o.   MRN: [id number]        Chief Complaint: follow up HTN, HLD and hyperglycemia       HPI:  Troy Parsons is a 61 y.o. male here with c/o        Wt down another 2 lbs peak of 227, total lost about 40 lbs in last yrs.  Due for flu shot.  Wt Readings from Last 3 Encounters:  12/11/21 190 lb (86.2 kg)  06/07/21 192 lb 3.2 oz (87.2 kg)  12/17/20 210 lb (95.3 kg)   BP Readings from Last 3 Encounters:  12/11/21 122/70  06/07/21 122/70  12/17/20 120/74         Past Medical History:  Diagnosis Date   Abnormal liver function    Depression    on meds   Esophageal reflux    on meds   Fatty liver    Gout    Hemorrhoids    Hiatal hernia    Hyperlipemia    on meds   Hypertension    on meds   Seasonal allergies    Type II or unspecified type diabetes mellitus without mention of complication, not stated as uncontrolled    on meds   Past Surgical History:  Procedure Laterality Date   APPENDECTOMY     NASAL SEPTUM SURGERY  9233   UMBILICAL HERNIA REPAIR  2009    reports that he has never smoked. He has never used smokeless tobacco. He reports current alcohol use of about 3.0 - 4.0 standard drinks of alcohol per week. He reports that he does not use drugs. family history includes Cancer in an other family member; Colon cancer in an other family member; Diabetes in his mother and paternal grandfather; Heart disease in an other family member; Hyperlipidemia in an other family member; Hypertension in an other family member; Stroke in an other family member. Allergies  Allergen Reactions   Lipitor [Atorvastatin] Other (See Comments)    Increased liver enxymes   Current Outpatient Medications on File Prior to Visit  Medication Sig Dispense Refill   allopurinol (ZYLOPRIM) 300 MG tablet TAKE 1 TABLET(300 MG) BY MOUTH DAILY 90 tablet 3   ALPRAZolam (XANAX) 0.5 MG tablet Take 1 tablet (0.5 mg total) by mouth 2 (two) times daily as  needed. 60 tablet 0   anastrozole (ARIMIDEX) 1 MG tablet Take 1 tablet by mouth once a week.     Ascorbic Acid (VITAMIN C) 500 MG CAPS Take 1 capsule by mouth daily at 6 (six) AM.     Cholecalciferol (VITAMIN D) 50 MCG (2000 UT) tablet Take 2,000 Units by mouth daily.     Continuous Blood Gluc Receiver (FREESTYLE LIBRE 14 DAY READER) DEVI Apply 1 Device topically 4 (four) times daily as needed. E11.9 1 Device 0   Continuous Blood Gluc Sensor (FREESTYLE LIBRE 3 SENSOR) MISC Apply 1 application topically every 14 (fourteen) days. Place 1 sensor on the skin every 14 days. E11.9 6 each 3   glipiZIDE (GLUCOTROL XL) 5 MG 24 hr tablet Take 1 tablet (5 mg total) by mouth daily with breakfast. 90 tablet 3   ibuprofen (ADVIL) 800 MG tablet TAKE 1 TABLET BY MOUTH TWICE DAILY AS NEEDED 60 tablet 2   irbesartan (AVAPRO) 300 MG tablet Take 1 tablet (300 mg total) by mouth daily. 90 tablet 1   levothyroxine (SYNTHROID) 25 MCG tablet TAKE 1 TABLET BY MOUTH EVERY DAY 90  tablet 1   mefloquine (LARIAM) 250 MG tablet Take 1 tablet (250 mg total) by mouth every 7 (seven) days. Start 1 week prior to travel 6 tablet 0   metFORMIN (GLUCOPHAGE-XR) 500 MG 24 hr tablet TAKE 4 TABLETS BY MOUTH IN THE MORNING 360 tablet 3   omeprazole (PRILOSEC) 20 MG capsule TAKE 1 CAPSULE(20 MG) BY MOUTH TWICE DAILY 180 capsule 3   Semaglutide, 1 MG/DOSE, 4 MG/3ML SOPN Inject 1 mg as directed once a week. 9 mL 1   sildenafil (VIAGRA) 100 MG tablet TAKE 1/2 TO 1 TABLET(50 TO 100 MG) BY MOUTH DAILY AS NEEDED FOR ERECTILE DYSFUNCTION 5 tablet 11   simvastatin (ZOCOR) 40 MG tablet TAKE 1 TABLET(40 MG) BY MOUTH AT BEDTIME 90 tablet 3   terbinafine (LAMISIL) 250 MG tablet Take 1 tablet (250 mg total) by mouth daily. 90 tablet 0   Testosterone Cypionate 200 MG/ML SOLN Inject 1 Dose as directed once a week.     typhoid (VIVOTIF) DR capsule Take 1 capsule by mouth every other day. 4 capsule 0   Zinc 100 MG TABS Take 1 tablet by mouth daily at 6  (six) AM.     zolpidem (AMBIEN) 10 MG tablet TAKE 1 TABLET(10 MG) BY MOUTH AT BEDTIME AS NEEDED FOR SLEEP 90 tablet 1   No current facility-administered medications on file prior to visit.        ROS:  All others reviewed and negative.  Objective        PE:  BP 122/70 (BP Location: Left Arm, Patient Position: Sitting, Cuff Size: Large)   Pulse 67   Temp 97.9 F (36.6 C) (Oral)   Ht '5\' 9"'$  (1.753 m)   Wt 190 lb (86.2 kg)   SpO2 99%   BMI 28.06 kg/m                 Constitutional: Pt appears in NAD               HENT: Head: NCAT.                Right Ear: External ear normal.                 Left Ear: External ear normal.                Eyes: . Pupils are equal, round, and reactive to light. Conjunctivae and EOM are normal               Nose: without d/c or deformity               Neck: Neck supple. Gross normal ROM               Cardiovascular: Normal rate and regular rhythm.                 Pulmonary/Chest: Effort normal and breath sounds without rales or wheezing.                Abd:  Soft, NT, ND, + BS, no organomegaly               Neurological: Pt is alert. At baseline orientation, motor grossly intact               Skin: Skin is warm. No rashes, no other new lesions, LE edema - ***               Psychiatric: Pt behavior is normal without agitation  Micro: none  Cardiac tracings I have personally interpreted today:  none  Pertinent Radiological findings (summarize): none   Lab Results  Component Value Date   WBC 4.9 05/30/2021   HGB 15.2 05/30/2021   HCT 46.2 05/30/2021   PLT 219.0 05/30/2021   GLUCOSE 112 (H) 12/03/2021   CHOL 120 12/03/2021   TRIG 167.0 (H) 12/03/2021   HDL 50.10 12/03/2021   LDLDIRECT 57.0 11/23/2019   LDLCALC 36 12/03/2021   ALT 22 12/03/2021   AST 23 12/03/2021   NA 137 12/03/2021   K 4.0 12/03/2021   CL 102 12/03/2021   CREATININE 0.84 12/03/2021   BUN 19 12/03/2021   CO2 27 12/03/2021   TSH 4.64 05/30/2021   PSA 1.01 05/30/2021    INR 1.0 03/29/2010   HGBA1C 6.3 12/03/2021   MICROALBUR 1.2 05/30/2021   Assessment/Plan:  Troy Parsons is a 61 y.o. White or Caucasian [1] male with  has a past medical history of Abnormal liver function, Depression, Esophageal reflux, Fatty liver, Gout, Hemorrhoids, Hiatal hernia, Hyperlipemia, Hypertension, Seasonal allergies, and Type II or unspecified type diabetes mellitus without mention of complication, not stated as uncontrolled.  No problem-specific Assessment & Plan notes found for this encounter.  Followup: No follow-ups on file.  Cathlean Cower, MD 12/11/2021 8:13 AM [facility]

## 2021-12-11 NOTE — Patient Instructions (Signed)
You had the flu shot today  You will be contacted regarding the referral for: Cardiac CT score  Please continue all other medications as before, and refills have been done if requested - irbesartan and thyroid medication  Please have the pharmacy call with any other refills you may need.  Please continue your efforts at being more active, low cholesterol diet, and weight control.  Please keep your appointments with your specialists as you may have planned  Please make an Appointment to return in 6 months, or sooner if needed, also with Lab Appointment for testing done 3-5 days before at the [facility] (so this is for TWO appointments - please see the scheduling desk as you leave)

## 2021-12-12 ENCOUNTER — Encounter: Payer: Self-pay | Admitting: Internal Medicine

## 2021-12-12 NOTE — Assessment & Plan Note (Addendum)
BP Readings from Last 3 Encounters:  12/11/21 122/70  06/07/21 122/70  12/17/20 120/74   Stable, pt to continue medical treatment avapro 300 mg qd, also to check Cardiac CT score

## 2021-12-12 NOTE — Assessment & Plan Note (Signed)
Last vitamin D Lab Results  Component Value Date   VD25OH 68.27 12/03/2021   Stable, cont oral replacement

## 2021-12-12 NOTE — Assessment & Plan Note (Signed)
Lab Results  Component Value Date   HGBA1C 6.3 12/03/2021   Stable, pt to continue current medical treatment metformin ER 500 mg = 4 qd, and ozemipic 1 mg weekly

## 2021-12-12 NOTE — Assessment & Plan Note (Signed)
Lab Results  Component Value Date   LDLCALC 36 12/03/2021   Stable, pt to continue current statin zocor 40 mg qd

## 2021-12-15 ENCOUNTER — Encounter: Payer: Self-pay | Admitting: Internal Medicine

## 2021-12-18 ENCOUNTER — Other Ambulatory Visit: Payer: Self-pay

## 2021-12-18 MED ORDER — SEMAGLUTIDE (1 MG/DOSE) 4 MG/3ML ~~LOC~~ SOPN: 1.0000 mg | PEN_INJECTOR | SUBCUTANEOUS | 1 refills | Status: DC

## 2022-01-06 ENCOUNTER — Other Ambulatory Visit: Payer: Self-pay | Admitting: Internal Medicine

## 2022-01-22 ENCOUNTER — Ambulatory Visit (INDEPENDENT_AMBULATORY_CARE_PROVIDER_SITE_OTHER): Payer: 59 | Admitting: Podiatry

## 2022-01-22 DIAGNOSIS — L600 Ingrowing nail: Secondary | ICD-10-CM | POA: Diagnosis not present

## 2022-01-22 NOTE — Progress Notes (Signed)
   Chief Complaint  Patient presents with   Ingrown Toenail     Right great toe nail ingrown , painful on the left side of the nail. No discharge , swelling or redness.     Subjective: Patient presents today for evaluation of pain to the lateral border right great toe. Patient is concerned for possible ingrown nail.  It is very sensitive to touch.  Patient presents today for further treatment and evaluation.  Past Medical History:  Diagnosis Date   Abnormal liver function    Depression    on meds   Esophageal reflux    on meds   Fatty liver    Gout    Hemorrhoids    Hiatal hernia    Hyperlipemia    on meds   Hypertension    on meds   Seasonal allergies    Type II or unspecified type diabetes mellitus without mention of complication, not stated as uncontrolled    on meds    Objective:  General: Well developed, nourished, in no acute distress, alert and oriented x3   Dermatology: Skin is warm, dry and supple bilateral.  Lateral border right great toe is tender with evidence of an ingrowing nail. Pain on palpation noted to the border of the nail fold. The remaining nails appear unremarkable at this time. There are no open sores, lesions.  Vascular: DP and PT pulses palpable.  No clinical evidence of vascular compromise  Neruologic: Grossly intact via light touch bilateral.  Musculoskeletal: No pedal deformity noted  Assesement: #1 Paronychia with ingrowing nail lateral border right great toe  Plan of Care:  1. Patient evaluated.  2. Discussed treatment alternatives and plan of care. Explained nail avulsion procedure and post procedure course to patient. 3. Patient opted for permanent partial nail avulsion of the ingrown portion of the nail.  4. Prior to procedure, local anesthesia infiltration utilized using 3 ml of a 50:50 mixture of 2% plain lidocaine and 0.5% plain marcaine in a normal hallux block fashion and a betadine prep performed.  5. Partial permanent nail  avulsion with chemical matrixectomy performed using [id number] applications of phenol followed by alcohol flush.  6. Light dressing applied.  Post care instructions provided 7.  Return to clinic 2 weeks.  Edrick Kins, DPM [facility]  Dr. Edrick Kins, DPM    [address], [place] [address]                Office [phone]  Fax [phone]

## 2022-01-28 ENCOUNTER — Other Ambulatory Visit: Payer: Self-pay | Admitting: Internal Medicine

## 2022-01-28 ENCOUNTER — Ambulatory Visit (HOSPITAL_COMMUNITY)
Admission: RE | Admit: 2022-01-28 | Discharge: 2022-01-28 | Disposition: A | Discharge status: Home / Self Care | Payer: 59 | Source: Ambulatory Visit | Attending: Internal Medicine | Admitting: Internal Medicine

## 2022-01-28 DIAGNOSIS — E782 Mixed hyperlipidemia: Secondary | ICD-10-CM

## 2022-01-28 DIAGNOSIS — R9431 Abnormal electrocardiogram [ECG] [EKG]: Secondary | ICD-10-CM

## 2022-01-28 DIAGNOSIS — E1165 Type 2 diabetes mellitus with hyperglycemia: Secondary | ICD-10-CM

## 2022-01-28 DIAGNOSIS — I10 Essential (primary) hypertension: Secondary | ICD-10-CM

## 2022-01-28 MED ORDER — ASPIRIN 81 MG PO TBEC: 81.0000 mg | DELAYED_RELEASE_TABLET | Freq: Every day | ORAL | 12 refills | Status: AC

## 2022-02-11 ENCOUNTER — Ambulatory Visit (INDEPENDENT_AMBULATORY_CARE_PROVIDER_SITE_OTHER): Payer: 59 | Admitting: Podiatry

## 2022-02-11 DIAGNOSIS — B351 Tinea unguium: Secondary | ICD-10-CM

## 2022-02-11 MED ORDER — TERBINAFINE HCL 250 MG PO TABS: 250.0000 mg | ORAL_TABLET | Freq: Every day | ORAL | 0 refills | Status: AC

## 2022-02-11 NOTE — Progress Notes (Signed)
   Chief Complaint  Patient presents with   Follow-up    3 week follow up from ingrown nail removal of right great hallux    Subjective: 62 y.o. male presents today status post permanent nail avulsion procedure of the lateral border of the right great toe that was performed on 01/22/2022.  Patient states that he feels much better.  He soaked his foot and applied antibiotic ointment as instructed..   Past Medical History:  Diagnosis Date   Abnormal liver function    Depression    on meds   Esophageal reflux    on meds   Fatty liver    Gout    Hemorrhoids    Hiatal hernia    Hyperlipemia    on meds   Hypertension    on meds   Seasonal allergies    Type II or unspecified type diabetes mellitus without mention of complication, not stated as uncontrolled    on meds    Objective: Neurovascular status intact.  Skin is warm, dry and supple. Nail and respective nail fold appears to be healing appropriately.   Assessment: #1 s/p partial permanent nail matrixectomy lateral border right great toe #2 fungal nail infection bilateral  Plan of care: #1 patient was evaluated  #2 light debridement of the periungual debris was performed to the border of the respective toe and nail plate using a tissue nipper. #3  Refill prescription for Lamisil 2 and 50 mg #90 daily.  Hepatic function panel 12/03/2021 WNL.  Alkaline phosphatase was 36 which is slightly below normative range.  Acceptable.   #4 patient is to return to clinic on a PRN basis.   Edrick Kins, DPM [facility]  Dr. Edrick Kins, DPM    [address], [place] [address]                Office [phone]  Fax [phone]

## 2022-02-12 ENCOUNTER — Encounter: Payer: Self-pay | Admitting: Internal Medicine

## 2022-02-12 ENCOUNTER — Other Ambulatory Visit: Payer: Self-pay | Admitting: Radiology

## 2022-02-12 MED ORDER — FREESTYLE LIBRE 3 SENSOR MISC: 1.0000 "application " | 3 refills | Status: DC

## 2022-03-06 ENCOUNTER — Telehealth: Payer: 59 | Admitting: Urgent Care

## 2022-03-06 DIAGNOSIS — K5900 Constipation, unspecified: Secondary | ICD-10-CM | POA: Diagnosis not present

## 2022-03-06 NOTE — Patient Instructions (Signed)
Troy Parsons, thank you for joining [name], PA for today's virtual visit.  While this provider is not your primary care provider (PCP), if your PCP is located in our provider database this encounter information will be shared with them immediately following your visit.   [facility] account gives you access to today's visit and all your visits, tests, and labs performed at [facility] " click here if you don't have a [place] account or go to [email or web address]  Consent: (Patient) Troy Parsons provided verbal consent for this virtual visit at the beginning of the encounter.  Current Medications:  Current Outpatient Medications:    allopurinol (ZYLOPRIM) 300 MG tablet, TAKE 1 TABLET(300 MG) BY MOUTH DAILY, Disp: 90 tablet, Rfl: 3   ALPRAZolam (XANAX) 0.5 MG tablet, Take 1 tablet (0.5 mg total) by mouth 2 (two) times daily as needed., Disp: 60 tablet, Rfl: 0   anastrozole (ARIMIDEX) 1 MG tablet, Take 1 tablet by mouth once a week., Disp: , Rfl:    Ascorbic Acid (VITAMIN C) 500 MG CAPS, Take 1 capsule by mouth daily at 6 (six) AM., Disp: , Rfl:    aspirin EC 81 MG tablet, Take 1 tablet (81 mg total) by mouth daily. Swallow whole., Disp: 30 tablet, Rfl: 12   Cholecalciferol (VITAMIN D) 50 MCG (2000 UT) tablet, Take 2,000 Units by mouth daily., Disp: , Rfl:    Continuous Blood Gluc Receiver (FREESTYLE LIBRE 14 DAY READER) DEVI, Apply 1 Device topically 4 (four) times daily as needed. E11.9, Disp: 1 Device, Rfl: 0   Continuous Blood Gluc Sensor (FREESTYLE LIBRE 3 SENSOR) MISC, Apply 1 application  topically every 14 (fourteen) days. Place 1 sensor on the skin every 14 days. E11.9, Disp: 6 each, Rfl: 3   ibuprofen (ADVIL) 800 MG tablet, TAKE 1 TABLET BY MOUTH TWICE DAILY AS NEEDED, Disp: 60 tablet, Rfl: 2   irbesartan (AVAPRO) 300 MG tablet, Take 1 tablet (300 mg total) by mouth daily., Disp: 90 tablet, Rfl: 3   levothyroxine (SYNTHROID) 25 MCG  tablet, TAKE 1 TABLET BY MOUTH EVERY DAY, Disp: 90 tablet, Rfl: 3   mefloquine (LARIAM) 250 MG tablet, Take 1 tablet (250 mg total) by mouth every 7 (seven) days. Start 1 week prior to travel, Disp: 6 tablet, Rfl: 0   metFORMIN (GLUCOPHAGE-XR) 500 MG 24 hr tablet, TAKE 4 TABLETS BY MOUTH IN THE MORNING, Disp: 360 tablet, Rfl: 3   omeprazole (PRILOSEC) 20 MG capsule, TAKE 1 CAPSULE(20 MG) BY MOUTH TWICE DAILY, Disp: 180 capsule, Rfl: 3   Semaglutide, 1 MG/DOSE, 4 MG/3ML SOPN, Inject 1 mg as directed once a week., Disp: 9 mL, Rfl: 1   sildenafil (VIAGRA) 100 MG tablet, TAKE 1/2 TO 1 TABLET(50 TO 100 MG) BY MOUTH DAILY AS NEEDED FOR ERECTILE DYSFUNCTION, Disp: 5 tablet, Rfl: 11   simvastatin (ZOCOR) 40 MG tablet, TAKE 1 TABLET(40 MG) BY MOUTH AT BEDTIME, Disp: 90 tablet, Rfl: 3   terbinafine (LAMISIL) 250 MG tablet, Take 1 tablet (250 mg total) by mouth daily., Disp: 90 tablet, Rfl: 0   Testosterone Cypionate 200 MG/ML SOLN, Inject 1 Dose as directed once a week., Disp: , Rfl:    typhoid (VIVOTIF) DR capsule, Take 1 capsule by mouth every other day., Disp: 4 capsule, Rfl: 0   Zinc 100 MG TABS, Take 1 tablet by mouth daily at 6 (six) AM., Disp: , Rfl:    zolpidem (AMBIEN) 10 MG tablet, TAKE 1 TABLET(10 MG)  BY MOUTH AT BEDTIME AS NEEDED FOR SLEEP, Disp: 90 tablet, Rfl: 1   Medications ordered in this encounter:  No orders of the defined types were placed in this encounter.    *If you need refills on other medications prior to your next appointment, please contact your pharmacy*  Follow-Up: Call back or seek an in-person evaluation if the symptoms worsen or if the condition fails to improve as anticipated.  [facility] [phone]  Other Instructions Your symptoms sound most consistent with constipation with overflow diarrhea. This usually occurs due to retained stool in the rectum. Please start with using an OTC enema such as a fleet enema x 1 use. If this does not resolve your  bowel movements to normal, then start taking one scoop (17g) of Miralax daily. Use this until normalcy achieved. If your symptoms persist, please follow up with your PCP. If you develop left upper quadrant pain worse with laying back, or nausea with vomiting and a fever, please head to the Emergency Room   If you have been instructed to have an in-person evaluation today at a local Urgent Care facility, please use the link below. It will take you to a list of all of our available [facility], including address, phone number and hours of operation. Please do not delay care.  [facility]  If you or a family member do not have a primary care provider, use the link below to schedule a visit and establish care. When you choose a [facility] primary care physician or advanced practice provider, you gain a long-term partner in health. Find a Primary Care Provider  Learn more about [facility] in-office and virtual care options: [facility]

## 2022-03-06 NOTE — Progress Notes (Signed)
Virtual Visit Consent   Troy Parsons, you are scheduled for a virtual visit with a [place] provider today. Just as with appointments in the office, your consent must be obtained to participate. Your consent will be active for this visit and any virtual visit you may have with one of our providers in the next 365 days. If you have a MyChart account, a copy of this consent can be sent to you electronically.  As this is a virtual visit, video technology does not allow for your provider to perform a traditional examination. This may limit your provider's ability to fully assess your condition. If your provider identifies any concerns that need to be evaluated in person or the need to arrange testing (such as labs, EKG, etc.), we will make arrangements to do so. Although advances in technology are sophisticated, we cannot ensure that it will always work on either your end or our end. If the connection with a video visit is poor, the visit may have to be switched to a telephone visit. With either a video or telephone visit, we are not always able to ensure that we have a secure connection.  By engaging in this virtual visit, you consent to the provision of healthcare and authorize for your insurance to be billed (if applicable) for the services provided during this visit. Depending on your insurance coverage, you may receive a charge related to this service.  I need to obtain your verbal consent now. Are you willing to proceed with your visit today? Troy Parsons has provided verbal consent on 03/06/2022 for a virtual visit (video or telephone). Troy Malling, PA  Date: 03/06/2022 8:12 AM  Virtual Visit via Video Note   I, Troy Parsons, connected with  Troy Parsons  ([id number], 1960/10/27) on 03/06/22 at  8:00 AM EST by a video-enabled telemedicine application and verified that I am speaking with the correct person using two identifiers.  Location: Patient: Virtual Visit Location Patient:  Other: hotel room Provider: Virtual Visit Location Provider: Home Office   I discussed the limitations of evaluation and management by telemedicine and the availability of in person appointments. The patient expressed understanding and agreed to proceed.    History of Present Illness: Troy Parsons is a 62 y.o. who identifies as a male is being seen today for constipation.  HPI: 62yo male with hx of DM presents today due to irregular bowel movements for the past two weeks, but worse over the last 5 days. Pt states that he has been on Ozempic for several years. Had some GI sx initially, but otherwise tolerates well. Over the past 5 days feels that he is having numerous small bowel movements with looser/ softer stools than normal. Pt reports some generalized abdominal discomfort but no acute pain. He states he feels "full". He is passing gas. He denies nausea or vomiting. No fever. He denies any recent change in his dietary or eating habits. No recent change to his ozempic dosage. Pt took OTC Mag-citrate a few days ago, and states his sx improved some yesterday/ this morning. No blood in stool.     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   RUQ pain 08/16/2019   Chest pain 04/26/2018   Ingrown toenail 01/14/2018   Hypothyroidism 04/16/2017   Abdominal pain 12/25/2016   Acute diarrhea 08/07/2016   Infectious gastroenteritis 08/07/2016   Hypertension 01/17/2016   Laryngopharyngeal reflux (LPR) 11/09/2015   Sudden idiopathic hearing loss, right ear 11/09/2015    Right otitis media 11/07/2015   Eustachian tube dysfunction 11/07/2015   Insomnia 10/17/2014   Lower back pain 06/23/2014   Encounter for well adult exam with abnormal findings 01/07/2011   NONSPEC ELEVATION OF LEVELS OF TRANSAMINASE/LDH 03/26/2010   GERD 10/22/2009   INGUINAL HERNIA, LEFT 12/13/2007   Left inguinal pain 12/13/2007   Type 2 diabetes mellitus ([place]) 11/30/2006   Allergic rhinitis 11/27/2006   Mixed hyperlipidemia 10/21/2006   GOUT, FOOT 10/17/2006   DEPRESSION 10/17/2006   ALLERGY, HX OF 10/17/2006    Allergies:  Allergies  Allergen Reactions   Lipitor [Atorvastatin] Other (See Comments)    Increased liver enxymes   Medications:  Current Outpatient Medications:    allopurinol (ZYLOPRIM) 300 MG tablet, TAKE 1 TABLET(300 MG) BY MOUTH DAILY, Disp: 90 tablet, Rfl: 3   ALPRAZolam (XANAX) 0.5 MG tablet, Take 1 tablet (0.5 mg total) by mouth 2 (two) times daily as needed., Disp: 60 tablet, Rfl: 0   anastrozole (ARIMIDEX) 1 MG tablet, Take 1 tablet by mouth once a week., Disp: , Rfl:    Ascorbic Acid (VITAMIN C) 500 MG CAPS, Take 1 capsule by mouth daily at 6 (six) AM., Disp: , Rfl:    aspirin EC 81 MG tablet, Take 1 tablet (81 mg total) by mouth daily. Swallow whole., Disp: 30 tablet, Rfl: 12   Cholecalciferol (VITAMIN D) 50 MCG (2000 UT) tablet, Take 2,000 Units by mouth daily., Disp: , Rfl:    Continuous Blood Gluc Receiver (FREESTYLE LIBRE 14 DAY READER) DEVI, Apply 1 Device topically 4 (four) times daily as needed. E11.9, Disp: 1 Device, Rfl: 0   Continuous Blood Gluc Sensor (FREESTYLE LIBRE 3 SENSOR) MISC, Apply 1 application  topically every 14 (fourteen) days. Place 1 sensor on the skin every 14 days. E11.9, Disp: 6 each, Rfl: 3   ibuprofen (ADVIL) 800 MG tablet, TAKE 1 TABLET BY MOUTH TWICE DAILY AS NEEDED, Disp: 60 tablet, Rfl: 2   irbesartan (AVAPRO) 300 MG tablet, Take 1 tablet (300 mg total) by mouth daily., Disp: 90 tablet, Rfl: 3   levothyroxine (SYNTHROID)  25 MCG tablet, TAKE 1 TABLET BY MOUTH EVERY DAY, Disp: 90 tablet, Rfl: 3   mefloquine (LARIAM) 250 MG tablet, Take 1 tablet (250 mg total) by mouth every 7 (seven) days. Start 1 week prior to travel, Disp: 6 tablet, Rfl: 0   metFORMIN (GLUCOPHAGE-XR) 500 MG 24 hr tablet, TAKE 4 TABLETS BY MOUTH IN THE MORNING, Disp: 360 tablet, Rfl: 3   omeprazole (PRILOSEC) 20 MG capsule, TAKE 1 CAPSULE(20 MG) BY MOUTH TWICE DAILY, Disp: 180 capsule, Rfl: 3   Semaglutide, 1 MG/DOSE, 4 MG/3ML SOPN, Inject 1 mg as directed once a week., Disp: 9 mL, Rfl: 1   sildenafil (VIAGRA) 100 MG tablet, TAKE 1/2 TO 1 TABLET(50 TO 100 MG) BY MOUTH DAILY AS NEEDED FOR ERECTILE DYSFUNCTION, Disp: 5 tablet, Rfl: 11   simvastatin (ZOCOR) 40 MG tablet, TAKE 1 TABLET(40 MG) BY MOUTH AT BEDTIME, Disp: 90 tablet, Rfl: 3   terbinafine (LAMISIL) 250 MG tablet, Take 1 tablet (250  mg total) by mouth daily., Disp: 90 tablet, Rfl: 0   Testosterone Cypionate 200 MG/ML SOLN, Inject 1 Dose as directed once a week., Disp: , Rfl:    typhoid (VIVOTIF) DR capsule, Take 1 capsule by mouth every other day., Disp: 4 capsule, Rfl: 0   Zinc 100 MG TABS, Take 1 tablet by mouth daily at 6 (six) AM., Disp: , Rfl:    zolpidem (AMBIEN) 10 MG tablet, TAKE 1 TABLET(10 MG) BY MOUTH AT BEDTIME AS NEEDED FOR SLEEP, Disp: 90 tablet, Rfl: 1  Observations/Objective: Patient is well-developed, well-nourished in no acute distress.  Resting comfortably sitting upright in chair in hotel room. Not acutely ill.  Head is normocephalic, atraumatic.  No labored breathing.  Speech is clear and coherent with logical content.  Patient is alert and oriented at baseline.  Pt smiling upon exam.  Assessment and Plan: 1. Constipation, unspecified constipation type  Pt sounds like he is experiencing overflow diarrhea around retained stool. He denies sx concerning for pancreatitis and looks well upon exam. I recommended he start with a single dose fleet enema. If no BM achieved  within 12 hours, then start with miralax once daily. Increase water intake and continue exercise. Pt will f/u with PCP if sx persist. ER precautions also discussed.  Follow Up Instructions: I discussed the assessment and treatment plan with the patient. The patient was provided an opportunity to ask questions and all were answered. The patient agreed with the plan and demonstrated an understanding of the instructions.  A copy of instructions were sent to the patient via MyChart unless otherwise noted below.    The patient was advised to call back or seek an in-person evaluation if the symptoms worsen or if the condition fails to improve as anticipated.  Time:  I spent 9 minutes with the patient via telehealth technology discussing the above problems/concerns.    Rhea, PA

## 2022-03-07 ENCOUNTER — Ambulatory Visit (INDEPENDENT_AMBULATORY_CARE_PROVIDER_SITE_OTHER): Payer: 59 | Admitting: Nurse Practitioner

## 2022-03-07 VITALS — BP 134/86 | HR 69 | Temp 97.9°F | Ht 69.0 in | Wt 186.1 lb

## 2022-03-07 DIAGNOSIS — R1011 Right upper quadrant pain: Secondary | ICD-10-CM | POA: Diagnosis not present

## 2022-03-07 DIAGNOSIS — R109 Unspecified abdominal pain: Secondary | ICD-10-CM | POA: Diagnosis not present

## 2022-03-07 LAB — CBC
HCT: 46.4 % (ref 39.0–52.0)
Hemoglobin: 15.6 g/dL (ref 13.0–17.0)
MCHC: 33.7 g/dL (ref 30.0–36.0)
MCV: 90.6 fl (ref 78.0–100.0)
Platelets: 224 10*3/uL (ref 150.0–400.0)
RBC: 5.12 Mil/uL (ref 4.22–5.81)
RDW: 15.1 % (ref 11.5–15.5)
WBC: 6.4 10*3/uL (ref 4.0–10.5)

## 2022-03-07 LAB — COMPREHENSIVE METABOLIC PANEL
ALT: 22 U/L (ref 0–53)
AST: 23 U/L (ref 0–37)
Albumin: 4.9 g/dL (ref 3.5–5.2)
Alkaline Phosphatase: 38 U/L — ABNORMAL LOW (ref 39–117)
BUN: 16 mg/dL (ref 6–23)
CO2: 28 mEq/L (ref 19–32)
Calcium: 9.8 mg/dL (ref 8.4–10.5)
Chloride: 101 mEq/L (ref 96–112)
Creatinine, Ser: 0.83 mg/dL (ref 0.40–1.50)
GFR: 95.75 mL/min (ref >60.00)
Glucose, Bld: 88 mg/dL (ref 70–99)
Potassium: 4 mEq/L (ref 3.5–5.1)
Sodium: 138 mEq/L (ref 135–145)
Total Bilirubin: 0.4 mg/dL (ref 0.2–1.2)
Total Protein: 7.2 g/dL (ref 6.0–8.3)

## 2022-03-07 LAB — URINALYSIS WITH CULTURE, IF INDICATED
Bilirubin Urine: NEGATIVE
Hgb urine dipstick: NEGATIVE
Ketones, ur: NEGATIVE
Leukocytes,Ua: NEGATIVE
Nitrite: NEGATIVE
RBC / HPF: NONE SEEN (ref 0–?)
Specific Gravity, Urine: 1.01 (ref 1.000–1.030)
Total Protein, Urine: NEGATIVE
Urine Glucose: NEGATIVE
Urobilinogen, UA: 0.2 (ref 0.0–1.0)
pH: 6 (ref 5.0–8.0)

## 2022-03-07 LAB — LIPASE: Lipase: 31 U/L (ref 11.0–59.0)

## 2022-03-07 NOTE — Progress Notes (Signed)
Established Patient Office Visit  Subjective   Patient ID: Troy Parsons, male    DOB: 1960-03-12  Age: 62 y.o. MRN: [id number]  Chief Complaint  Patient presents with   Abdominal Pain    RUQ pain, symptom onset 5 days ago.  He was experiencing constipation but after using magnesium citrate as well as enema has had a good bowel movement, last 1 was today.  Since then pain has improved.  Initially was 7/10 in intensity, now 4 out of 10 intensity described as dull.  It is constant but not affecting his ability to participate in normal activities of daily living.  Has been taking ibuprofen as needed, has not felt the need to take any today.  Has had some nausea but no vomiting.    Review of Systems  Constitutional:  Negative for chills and fever.  Cardiovascular:  Negative for chest pain and palpitations.  Gastrointestinal:  Positive for abdominal pain, constipation (improved) and nausea. Negative for blood in stool, melena and vomiting.  Genitourinary:  Negative for hematuria.      Objective:     BP 134/86   Pulse 69   Temp 97.9 F (36.6 C) (Temporal)   Ht '5\' 9"'$  (1.753 m)   Wt 186 lb 2 oz (84.4 kg)   SpO2 99%   BMI 27.49 kg/m    Physical Exam Vitals reviewed.  Constitutional:      Appearance: Normal appearance.  HENT:     Head: Normocephalic and atraumatic.  Cardiovascular:     Rate and Rhythm: Normal rate and regular rhythm.  Pulmonary:     Effort: Pulmonary effort is normal.     Breath sounds: Normal breath sounds.  Abdominal:     General: Abdomen is flat. Bowel sounds are normal.     Palpations: Abdomen is soft.     Tenderness: There is abdominal tenderness in the left lower quadrant. There is no guarding or rebound.     Comments: Very mild tenderness  Musculoskeletal:     Cervical back: Neck supple.  Skin:    General: Skin is warm and dry.  Neurological:     Mental Status: He is alert and oriented to person, place, and time.  Psychiatric:        Mood  and Affect: Mood normal.        Behavior: Behavior normal.        Thought Content: Thought content normal.        Judgment: Judgment normal.      No results found for any visits on 03/07/22.    The ASCVD Risk score (Arnett DK, et al., 2019) failed to calculate for the following reasons:   The valid total cholesterol range is 130 to 320 mg/dL    Assessment & Plan:   Problem List Items Addressed This Visit       Other   RUQ pain    Etiology unclear, however pain is improving.  I think this may likely have been related to his constipation however we will get ultrasound of right upper quadrant to visualize gallbladder.  We also check labs/urine testing for further evaluation.  Per shared decision making patient will consider skipping next dose of Ozempic, and was educated to avoid fatty foods for the next few days as well.  Patient to follow-up in 1 month for close monitoring, or sooner as needed.      Relevant Orders   CBC   Comprehensive metabolic panel   Lipase   US  Abdomen Limited RUQ (LIVER/GB)   Right flank pain - Primary   Relevant Orders   Urinalysis with Culture, if indicated    Return in about 1 month (around 04/07/2022) for f/u with Judson Roch.    Ailene Ards, NP

## 2022-03-07 NOTE — Assessment & Plan Note (Signed)
Etiology unclear, however pain is improving.  I think this may likely have been related to his constipation however we will get ultrasound of right upper quadrant to visualize gallbladder.  We also check labs/urine testing for further evaluation.  Per shared decision making patient will consider skipping next dose of Ozempic, and was educated to avoid fatty foods for the next few days as well.  Patient to follow-up in 1 month for close monitoring, or sooner as needed.

## 2022-03-17 ENCOUNTER — Ambulatory Visit
Admission: RE | Admit: 2022-03-17 | Discharge: 2022-03-17 | Disposition: A | Discharge status: Home / Self Care | Payer: 59 | Source: Ambulatory Visit | Attending: Nurse Practitioner | Admitting: Nurse Practitioner

## 2022-03-17 DIAGNOSIS — R1011 Right upper quadrant pain: Secondary | ICD-10-CM

## 2022-04-02 ENCOUNTER — Encounter: Payer: Self-pay | Admitting: Internal Medicine

## 2022-04-11 ENCOUNTER — Ambulatory Visit: Payer: 59 | Admitting: Nurse Practitioner

## 2022-05-20 ENCOUNTER — Encounter: Payer: Self-pay | Admitting: Internal Medicine

## 2022-05-20 MED ORDER — IBUPROFEN 800 MG PO TABS: 800.0000 mg | ORAL_TABLET | Freq: Two times a day (BID) | ORAL | 2 refills | Status: DC | PRN

## 2022-05-20 NOTE — Telephone Encounter (Signed)
LOV was with Maralyn Sago on 03/07/22

## 2022-05-27 ENCOUNTER — Other Ambulatory Visit: Payer: Self-pay

## 2022-05-27 ENCOUNTER — Encounter: Payer: Self-pay | Admitting: Internal Medicine

## 2022-05-27 MED ORDER — SEMAGLUTIDE (1 MG/DOSE) 4 MG/3ML ~~LOC~~ SOPN: 1.0000 mg | PEN_INJECTOR | SUBCUTANEOUS | 1 refills | Status: DC

## 2022-06-09 ENCOUNTER — Other Ambulatory Visit (INDEPENDENT_AMBULATORY_CARE_PROVIDER_SITE_OTHER): Payer: 59

## 2022-06-09 DIAGNOSIS — Z125 Encounter for screening for malignant neoplasm of prostate: Secondary | ICD-10-CM

## 2022-06-09 DIAGNOSIS — E1165 Type 2 diabetes mellitus with hyperglycemia: Secondary | ICD-10-CM

## 2022-06-09 DIAGNOSIS — E538 Deficiency of other specified B group vitamins: Secondary | ICD-10-CM

## 2022-06-09 DIAGNOSIS — E559 Vitamin D deficiency, unspecified: Secondary | ICD-10-CM

## 2022-06-09 LAB — TSH: TSH: 3.19 u[IU]/mL (ref 0.35–5.50)

## 2022-06-09 LAB — CBC WITH DIFFERENTIAL/PLATELET
Basophils Absolute: 0 10*3/uL (ref 0.0–0.1)
Basophils Relative: 0.7 % (ref 0.0–3.0)
Eosinophils Absolute: 0.2 10*3/uL (ref 0.0–0.7)
Eosinophils Relative: 3 % (ref 0.0–5.0)
HCT: 44.7 % (ref 39.0–52.0)
Hemoglobin: 15.2 g/dL (ref 13.0–17.0)
Lymphocytes Relative: 29.5 % (ref 12.0–46.0)
Lymphs Abs: 1.5 10*3/uL (ref 0.7–4.0)
MCHC: 34.1 g/dL (ref 30.0–36.0)
MCV: 91.6 fl (ref 78.0–100.0)
Monocytes Absolute: 0.6 10*3/uL (ref 0.1–1.0)
Monocytes Relative: 11.7 % (ref 3.0–12.0)
Neutro Abs: 2.9 10*3/uL (ref 1.4–7.7)
Neutrophils Relative %: 55.1 % (ref 43.0–77.0)
Platelets: 222 10*3/uL (ref 150.0–400.0)
RBC: 4.88 Mil/uL (ref 4.22–5.81)
RDW: 14.4 % (ref 11.5–15.5)
WBC: 5.2 10*3/uL (ref 4.0–10.5)

## 2022-06-09 LAB — URINALYSIS, ROUTINE W REFLEX MICROSCOPIC
Bilirubin Urine: NEGATIVE
Hgb urine dipstick: NEGATIVE
Ketones, ur: NEGATIVE
Leukocytes,Ua: NEGATIVE
Nitrite: NEGATIVE
RBC / HPF: NONE SEEN (ref 0–?)
Specific Gravity, Urine: 1.015 (ref 1.000–1.030)
Total Protein, Urine: NEGATIVE
Urine Glucose: NEGATIVE
Urobilinogen, UA: 0.2 (ref 0.0–1.0)
pH: 7.5 (ref 5.0–8.0)

## 2022-06-09 LAB — LIPID PANEL
Cholesterol: 103 mg/dL (ref 0–200)
HDL: 45.5 mg/dL (ref >39.00)
LDL Cholesterol: 24 mg/dL (ref 0–99)
NonHDL: 57.35
Total CHOL/HDL Ratio: 2
Triglycerides: 165 mg/dL — ABNORMAL HIGH (ref 0.0–149.0)
VLDL: 33 mg/dL (ref 0.0–40.0)

## 2022-06-09 LAB — BASIC METABOLIC PANEL
BUN: 14 mg/dL (ref 6–23)
CO2: 28 mEq/L (ref 19–32)
Calcium: 9.2 mg/dL (ref 8.4–10.5)
Chloride: 102 mEq/L (ref 96–112)
Creatinine, Ser: 0.79 mg/dL (ref 0.40–1.50)
GFR: 97.01 mL/min (ref >60.00)
Glucose, Bld: 122 mg/dL — ABNORMAL HIGH (ref 70–99)
Potassium: 4.4 mEq/L (ref 3.5–5.1)
Sodium: 137 mEq/L (ref 135–145)

## 2022-06-09 LAB — VITAMIN D 25 HYDROXY (VIT D DEFICIENCY, FRACTURES): VITD: 52.61 ng/mL (ref 30.00–100.00)

## 2022-06-09 LAB — VITAMIN B12: Vitamin B-12: 1148 pg/mL — ABNORMAL HIGH (ref 211–911)

## 2022-06-09 LAB — HEPATIC FUNCTION PANEL
ALT: 22 U/L (ref 0–53)
AST: 21 U/L (ref 0–37)
Albumin: 4.2 g/dL (ref 3.5–5.2)
Alkaline Phosphatase: 35 U/L — ABNORMAL LOW (ref 39–117)
Bilirubin, Direct: 0.1 mg/dL (ref 0.0–0.3)
Total Bilirubin: 0.5 mg/dL (ref 0.2–1.2)
Total Protein: 6 g/dL (ref 6.0–8.3)

## 2022-06-09 LAB — HEMOGLOBIN A1C: Hgb A1c MFr Bld: 6.1 % (ref 4.6–6.5)

## 2022-06-09 LAB — MICROALBUMIN / CREATININE URINE RATIO
Creatinine,U: 92.5 mg/dL
Microalb Creat Ratio: 0.8 mg/g (ref 0.0–30.0)
Microalb, Ur: 0.7 mg/dL (ref 0.0–1.9)

## 2022-06-09 LAB — PSA: PSA: 1.4 ng/mL (ref 0.10–4.00)

## 2022-06-11 ENCOUNTER — Ambulatory Visit: Payer: 59 | Admitting: Internal Medicine

## 2022-06-17 ENCOUNTER — Encounter: Payer: Self-pay | Admitting: Internal Medicine

## 2022-06-17 ENCOUNTER — Ambulatory Visit (INDEPENDENT_AMBULATORY_CARE_PROVIDER_SITE_OTHER): Payer: 59 | Admitting: Internal Medicine

## 2022-06-17 VITALS — BP 122/78 | HR 70 | Temp 98.1°F | Ht 69.0 in | Wt 185.0 lb

## 2022-06-17 DIAGNOSIS — R7989 Other specified abnormal findings of blood chemistry: Secondary | ICD-10-CM | POA: Diagnosis not present

## 2022-06-17 DIAGNOSIS — Z7984 Long term (current) use of oral hypoglycemic drugs: Secondary | ICD-10-CM

## 2022-06-17 DIAGNOSIS — E039 Hypothyroidism, unspecified: Secondary | ICD-10-CM

## 2022-06-17 DIAGNOSIS — E559 Vitamin D deficiency, unspecified: Secondary | ICD-10-CM

## 2022-06-17 DIAGNOSIS — I2510 Atherosclerotic heart disease of native coronary artery without angina pectoris: Secondary | ICD-10-CM | POA: Diagnosis not present

## 2022-06-17 DIAGNOSIS — I10 Essential (primary) hypertension: Secondary | ICD-10-CM | POA: Diagnosis not present

## 2022-06-17 DIAGNOSIS — Z7985 Long-term (current) use of injectable non-insulin antidiabetic drugs: Secondary | ICD-10-CM

## 2022-06-17 DIAGNOSIS — Z0001 Encounter for general adult medical examination with abnormal findings: Secondary | ICD-10-CM

## 2022-06-17 DIAGNOSIS — E1165 Type 2 diabetes mellitus with hyperglycemia: Secondary | ICD-10-CM

## 2022-06-17 DIAGNOSIS — E782 Mixed hyperlipidemia: Secondary | ICD-10-CM

## 2022-06-17 DIAGNOSIS — Z0000 Encounter for general adult medical examination without abnormal findings: Secondary | ICD-10-CM

## 2022-06-17 MED ORDER — SIMVASTATIN 40 MG PO TABS: ORAL_TABLET | ORAL | 3 refills | Status: DC

## 2022-06-17 MED ORDER — OMEPRAZOLE 20 MG PO CPDR: DELAYED_RELEASE_CAPSULE | ORAL | 3 refills | Status: DC

## 2022-06-17 NOTE — Patient Instructions (Signed)
Please remember to take the Asa 81 mg per day  Please continue all other medications as before, and refills have been done if requested.  Please have the pharmacy call with any other refills you may need.  Please continue your efforts at being more active, low cholesterol diet, and weight control.  You are otherwise up to date with prevention measures today.  Please keep your appointments with your specialists as you may have planned  Please call if you feel you would want to see Cardiology  Please make an Appointment to return in 6 months, or sooner if needed, also with Lab Appointment for testing done 3-5 days before at the FIRST FLOOR Lab (so this is for TWO appointments - please see the scheduling desk as you leave)

## 2022-06-17 NOTE — Progress Notes (Unsigned)
Patient ID: Troy Parsons, male   DOB: 30-Jan-1961, 62 y.o.   MRN: [id number]         Chief Complaint:: wellness exam and low b12, htn, low thryoid, dm, low vit d       HPI:  Troy Parsons is a 62 y.o. male here for wellness exam; has eye exam sched for jiuly 2024; o/w up to date                        Also lost total 45 lbs in last 3 yrs, 5 just in past 6 months, goes to the gym over 5 days per wk. .Not always taking the Aspirin every day. Pt denies chest pain, increased sob or doe, wheezing, orthopnea, PND, increased LE swelling, palpitations, dizziness or syncope.   Pt denies polydipsia, polyuria, or new focal neuro s/s.    Pt denies fever, wt loss, night sweats, loss of appetite, or other constitutional symptoms Denies hyper or hypo thyroid symptoms such as voice, skin or hair change.   Wt Readings from Last 3 Encounters:  06/17/22 185 lb (83.9 kg)  03/07/22 186 lb 2 oz (84.4 kg)  12/11/21 190 lb (86.2 kg)   BP Readings from Last 3 Encounters:  06/17/22 122/78  03/07/22 134/86  12/11/21 122/70   Immunization History  Administered Date(s) Administered   Influenza Whole 11/22/2007, 12/18/2008   Influenza,inj,Quad PF,6+ Mos 01/19/2013, 12/13/2017, 10/27/2018, 11/29/2019, 12/05/2020, 12/11/2021   Influenza-Unspecified 11/28/2015, 10/30/2016, 12/11/2017   MMR 06/16/2017   PFIZER(Purple Top)SARS-COV-2 Vaccination 04/06/2019, 05/04/2019, 12/29/2019   Pneumococcal Conjugate-13 03/30/2015   Pneumococcal Polysaccharide-23 04/16/2017   Td 12/29/2008   Tdap 06/07/2020   Zoster Recombinat (Shingrix) 08/27/2020, 11/19/2020   Health Maintenance Due  Topic Date Due   OPHTHALMOLOGY EXAM  05/30/2022      Past Medical History:  Diagnosis Date   Abnormal liver function    Depression    on meds   Esophageal reflux    on meds   Fatty liver    Gout    Hemorrhoids    Hiatal hernia    Hyperlipemia    on meds   Hypertension    on meds   Seasonal allergies    Type II or unspecified  type diabetes mellitus without mention of complication, not stated as uncontrolled    on meds   Past Surgical History:  Procedure Laterality Date   APPENDECTOMY     NASAL SEPTUM SURGERY  2010   UMBILICAL HERNIA REPAIR  2009    reports that he has never smoked. He has never used smokeless tobacco. He reports current alcohol use of about 3.0 - 4.0 standard drinks of alcohol per week. He reports that he does not use drugs. family history includes Cancer in an other family member; Colon cancer in an other family member; Diabetes in his mother and paternal grandfather; Heart disease in an other family member; Hyperlipidemia in an other family member; Hypertension in an other family member; Stroke in an other family member. Allergies  Allergen Reactions   Lipitor [Atorvastatin] Other (See Comments)    Increased liver enxymes   Current Outpatient Medications on File Prior to Visit  Medication Sig Dispense Refill   allopurinol (ZYLOPRIM) 300 MG tablet TAKE 1 TABLET(300 MG) BY MOUTH DAILY 90 tablet 3   ALPRAZolam (XANAX) 0.5 MG tablet Take 1 tablet (0.5 mg total) by mouth 2 (two) times daily as needed. 60 tablet 0   anastrozole (ARIMIDEX) 1 MG  tablet Take 1 tablet by mouth once a week.     Ascorbic Acid (VITAMIN C) 500 MG CAPS Take 1 capsule by mouth daily at 6 (six) AM.     aspirin EC 81 MG tablet Take 1 tablet (81 mg total) by mouth daily. Swallow whole. 30 tablet 12   Cholecalciferol (VITAMIN D) 50 MCG (2000 UT) tablet Take 2,000 Units by mouth daily.     Continuous Blood Gluc Receiver (FREESTYLE LIBRE 14 DAY READER) DEVI Apply 1 Device topically 4 (four) times daily as needed. E11.9 1 Device 0   Continuous Blood Gluc Sensor (FREESTYLE LIBRE 3 SENSOR) MISC Apply 1 application  topically every 14 (fourteen) days. Place 1 sensor on the skin every 14 days. E11.9 6 each 3   ibuprofen (ADVIL) 800 MG tablet Take 1 tablet (800 mg total) by mouth 2 (two) times daily as needed. 60 tablet 2   irbesartan  (AVAPRO) 300 MG tablet Take 1 tablet (300 mg total) by mouth daily. 90 tablet 3   levothyroxine (SYNTHROID) 25 MCG tablet TAKE 1 TABLET BY MOUTH EVERY DAY 90 tablet 3   mefloquine (LARIAM) 250 MG tablet Take 1 tablet (250 mg total) by mouth every 7 (seven) days. Start 1 week prior to travel 6 tablet 0   metFORMIN (GLUCOPHAGE-XR) 500 MG 24 hr tablet TAKE 4 TABLETS BY MOUTH IN THE MORNING 360 tablet 3   Semaglutide, 1 MG/DOSE, 4 MG/3ML SOPN Inject 1 mg as directed once a week. 9 mL 1   sildenafil (VIAGRA) 100 MG tablet TAKE 1/2 TO 1 TABLET(50 TO 100 MG) BY MOUTH DAILY AS NEEDED FOR ERECTILE DYSFUNCTION 5 tablet 11   terbinafine (LAMISIL) 250 MG tablet Take 1 tablet (250 mg total) by mouth daily. 90 tablet 0   Testosterone Cypionate 200 MG/ML SOLN Inject 1 Dose as directed once a week.     Zinc 100 MG TABS Take 1 tablet by mouth daily at 6 (six) AM.     zolpidem (AMBIEN) 10 MG tablet TAKE 1 TABLET(10 MG) BY MOUTH AT BEDTIME AS NEEDED FOR SLEEP 90 tablet 1   No current facility-administered medications on file prior to visit.        ROS:  All others reviewed and negative.  Objective        PE:  BP 122/78 (BP Location: Right Arm, Patient Position: Sitting, Cuff Size: Normal)   Pulse 70   Temp 98.1 F (36.7 C) (Oral)   Ht 5\' 9"  (1.753 m)   Wt 185 lb (83.9 kg)   SpO2 99%   BMI 27.32 kg/m                 Constitutional: Pt appears in NAD               HENT: Head: NCAT.                Right Ear: External ear normal.                 Left Ear: External ear normal.                Eyes: . Pupils are equal, round, and reactive to light. Conjunctivae and EOM are normal               Nose: without d/c or deformity               Neck: Neck supple. Gross normal ROM  Cardiovascular: Normal rate and regular rhythm.                 Pulmonary/Chest: Effort normal and breath sounds without rales or wheezing.                Abd:  Soft, NT, ND, + BS, no organomegaly                Neurological: Pt is alert. At baseline orientation, motor grossly intact               Skin: Skin is warm. No rashes, no other new lesions, LE edema - none               Psychiatric: Pt behavior is normal without agitation   Micro: none  Cardiac tracings I have personally interpreted today:  none  Pertinent Radiological findings (summarize): none   Lab Results  Component Value Date   WBC 5.2 06/09/2022   HGB 15.2 06/09/2022   HCT 44.7 06/09/2022   PLT 222.0 06/09/2022   GLUCOSE 122 (H) 06/09/2022   CHOL 103 06/09/2022   TRIG 165.0 (H) 06/09/2022   HDL 45.50 06/09/2022   LDLDIRECT 57.0 11/23/2019   LDLCALC 24 06/09/2022   ALT 22 06/09/2022   AST 21 06/09/2022   NA 137 06/09/2022   K 4.4 06/09/2022   CL 102 06/09/2022   CREATININE 0.79 06/09/2022   BUN 14 06/09/2022   CO2 28 06/09/2022   TSH 3.19 06/09/2022   PSA 1.40 06/09/2022   INR 1.0 03/29/2010   HGBA1C 6.1 06/09/2022   MICROALBUR 0.7 06/09/2022   Assessment/Plan:  HENDRIXX MCQUOWN is a 62 y.o. White or Caucasian [1] male with  has a past medical history of Abnormal liver function, Depression, Esophageal reflux, Fatty liver, Gout, Hemorrhoids, Hiatal hernia, Hyperlipemia, Hypertension, Seasonal allergies, and Type II or unspecified type diabetes mellitus without mention of complication, not stated as uncontrolled.  Encounter for well adult exam with abnormal findings Age and sex appropriate education and counseling updated with regular exercise and diet Referrals for preventative services - for eye exam July 2024 scheduled Immunizations addressed - none needed Smoking counseling  - none needed Evidence for depression or other mood disorder - none significant Most recent labs reviewed. I have personally reviewed and have noted: 1) the patient's medical and social history 2) The patient's current medications and supplements 3) The patient's height, weight, and BMI have been recorded in the chart   Hypertension BP  Readings from Last 3 Encounters:  06/17/22 122/78  03/07/22 134/86  12/11/21 122/70   Stable, pt to continue medical treatment avapro 300 qd   Hypothyroidism Lab Results  Component Value Date   TSH 3.19 06/09/2022   Stable, pt to continue levothyroxine 25 mcg qd   Low vitamin B12 level Lab Results  Component Value Date   VITAMINB12 1,148 (H) 06/09/2022   Overcontrolled, pt to reduce oral replacement - b12 1000 mcg to 3 times weekly   Mixed hyperlipidemia Lab Results  Component Value Date   LDLCALC 24 06/09/2022   Stable, pt to continue current statin zocor 40 qd   Type 2 diabetes mellitus (HCC) Lab Results  Component Value Date   HGBA1C 6.1 06/09/2022   Stable, pt to continue current medical treatment metformin eR 500 gm - 4 qd, ozemipc 1 mg weekly   Vitamin D deficiency Last vitamin D Lab Results  Component Value Date   VD25OH 52.61 06/09/2022   Stable, cont oral replacement  Coronary artery calcification seen on CT scan Pt advised to restart asa 81 mg qd, continue statin  Followup: Return in about 6 months (around 12/18/2022).  Oliver Barre, MD 06/18/2022 8:35 PM [facility]

## 2022-06-18 ENCOUNTER — Encounter: Payer: Self-pay | Admitting: Internal Medicine

## 2022-06-18 DIAGNOSIS — I2510 Atherosclerotic heart disease of native coronary artery without angina pectoris: Secondary | ICD-10-CM | POA: Insufficient documentation

## 2022-06-18 NOTE — Assessment & Plan Note (Signed)
Age and sex appropriate education and counseling updated with regular exercise and diet Referrals for preventative services - for eye exam July 2024 scheduled Immunizations addressed - none needed Smoking counseling  - none needed Evidence for depression or other mood disorder - none significant Most recent labs reviewed. I have personally reviewed and have noted: 1) the patient's medical and social history 2) The patient's current medications and supplements 3) The patient's height, weight, and BMI have been recorded in the chart

## 2022-06-18 NOTE — Assessment & Plan Note (Signed)
Lab Results  Component Value Date   LDLCALC 24 06/09/2022   Stable, pt to continue current statin zocor 40 qd

## 2022-06-18 NOTE — Assessment & Plan Note (Signed)
Lab Results  Component Value Date   VITAMINB12 1,148 (H) 06/09/2022   Overcontrolled, pt to reduce oral replacement - b12 1000 mcg to 3 times weekly

## 2022-06-18 NOTE — Assessment & Plan Note (Signed)
Pt advised to restart asa 81 mg qd, continue statin

## 2022-06-18 NOTE — Assessment & Plan Note (Signed)
Last vitamin D Lab Results  Component Value Date   VD25OH 52.61 06/09/2022   Stable, cont oral replacement

## 2022-06-18 NOTE — Assessment & Plan Note (Signed)
Lab Results  Component Value Date   HGBA1C 6.1 06/09/2022   Stable, pt to continue current medical treatment metformin eR 500 gm - 4 qd, ozemipc 1 mg weekly

## 2022-06-18 NOTE — Assessment & Plan Note (Signed)
Lab Results  Component Value Date   TSH 3.19 06/09/2022   Stable, pt to continue levothyroxine 25 mcg qd

## 2022-06-18 NOTE — Assessment & Plan Note (Signed)
BP Readings from Last 3 Encounters:  06/17/22 122/78  03/07/22 134/86  12/11/21 122/70   Stable, pt to continue medical treatment avapro 300 qd

## 2022-06-28 ENCOUNTER — Encounter: Payer: Self-pay | Admitting: Internal Medicine

## 2022-06-30 ENCOUNTER — Other Ambulatory Visit: Payer: Self-pay

## 2022-06-30 MED ORDER — ZOLPIDEM TARTRATE 10 MG PO TABS: ORAL_TABLET | ORAL | 1 refills | Status: DC

## 2022-06-30 MED ORDER — METFORMIN HCL ER 500 MG PO TB24: ORAL_TABLET | ORAL | 3 refills | Status: DC

## 2022-06-30 MED ORDER — ALLOPURINOL 300 MG PO TABS: ORAL_TABLET | ORAL | 3 refills | Status: DC

## 2022-07-23 ENCOUNTER — Encounter: Payer: Self-pay | Admitting: Internal Medicine

## 2022-07-24 ENCOUNTER — Other Ambulatory Visit: Payer: Self-pay | Admitting: Radiology

## 2022-07-24 MED ORDER — SILDENAFIL CITRATE 100 MG PO TABS: ORAL_TABLET | ORAL | 11 refills | Status: DC

## 2022-10-14 ENCOUNTER — Ambulatory Visit
Admission: RE | Admit: 2022-10-14 | Discharge: 2022-10-14 | Disposition: A | Discharge status: Home / Self Care | Payer: 59 | Source: Ambulatory Visit | Attending: Internal Medicine | Admitting: Internal Medicine

## 2022-10-14 VITALS — BP 162/82 | HR 64 | Temp 98.2°F | Resp 17

## 2022-10-14 DIAGNOSIS — Z113 Encounter for screening for infections with a predominantly sexual mode of transmission: Secondary | ICD-10-CM | POA: Diagnosis present

## 2022-10-14 DIAGNOSIS — N489 Disorder of penis, unspecified: Secondary | ICD-10-CM | POA: Insufficient documentation

## 2022-10-14 NOTE — Discharge Instructions (Signed)
The clinic will contact you with results of the testing done today if positive.  Please follow-up with your PCP if your symptoms do not improve.  Please go to the ER for any worsening symptoms.

## 2022-10-14 NOTE — ED Triage Notes (Signed)
Pt presents for STD testing and reports a penile lesion X 3 days.   States the lesion is irritated and itchy. Denies penile discharge and urinary issues.

## 2022-10-14 NOTE — ED Provider Notes (Signed)
[facility]    CSN: [id number] Arrival date & time: 10/14/22  0919      History   Chief Complaint Chief Complaint  Patient presents with   SEXUALLY TRANSMITTED DISEASE    Possible STD with penis lesion. - Entered by patient    HPI Troy Parsons is a 62 y.o. male presents for evaluation of STD screening/penile lesion.  Patient reports 3 days of a mildly irritating/itchy penile lesion.  Denies any drainage, swelling.  Thought it was a pimple and tried to squeeze it without success.  Does state he trims the area.  Denies any penile drainage or discharge, testicular pain or swelling, dysuria, fevers.  Has recently had unprotected intercourse but no known STD exposure.  No other concerns at this time.  HPI  Past Medical History:  Diagnosis Date   Abnormal liver function    Depression    on meds   Esophageal reflux    on meds   Fatty liver    Gout    Hemorrhoids    Hiatal hernia    Hyperlipemia    on meds   Hypertension    on meds   Seasonal allergies    Type II or unspecified type diabetes mellitus without mention of complication, not stated as uncontrolled    on meds    Patient Active Problem List   Diagnosis Date Noted   Coronary artery calcification seen on CT scan 06/18/2022   Right flank pain 03/07/2022   Low vitamin B12 level 06/08/2021   Travel advice encounter 06/08/2021   Asymmetrical sensorineural hearing loss 11/07/2020   Tinnitus of both ears 11/07/2020   Vitamin D deficiency 06/10/2020   Anxiety 06/10/2020   Body mass index (BMI) 33.0-33.9, adult 06/07/2020   Fatty (change of) liver, not elsewhere classified 06/07/2020   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   RUQ pain 08/16/2019   Chest pain 04/26/2018   Ingrown toenail 01/14/2018   Hypothyroidism 04/16/2017   Abdominal pain 12/25/2016   Acute diarrhea 08/07/2016   Infectious gastroenteritis 08/07/2016   Hypertension 01/17/2016    Laryngopharyngeal reflux (LPR) 11/09/2015   Sudden idiopathic hearing loss, right ear 11/09/2015   Right otitis media 11/07/2015   Eustachian tube dysfunction 11/07/2015   Insomnia 10/17/2014   Lower back pain 06/23/2014   Encounter for well adult exam with abnormal findings 01/07/2011   NONSPEC ELEVATION OF LEVELS OF TRANSAMINASE/LDH 03/26/2010   GERD 10/22/2009   INGUINAL HERNIA, LEFT 12/13/2007   Left inguinal pain 12/13/2007   Type 2 diabetes mellitus (HCC) 11/30/2006   Allergic rhinitis 11/27/2006   Mixed hyperlipidemia 10/21/2006   GOUT, FOOT 10/17/2006   DEPRESSION 10/17/2006   ALLERGY, HX OF 10/17/2006    Past Surgical History:  Procedure Laterality Date   APPENDECTOMY     NASAL SEPTUM SURGERY  2010   UMBILICAL HERNIA REPAIR  2009       Home Medications    Prior to Admission medications   Medication Sig Start Date End Date Taking? Authorizing Provider  allopurinol (ZYLOPRIM) 300 MG tablet TAKE 1 TABLET(300 MG) BY MOUTH DAILY 06/30/22   Corwin Levins, MD  ALPRAZolam Prudy Feeler) 0.5 MG tablet Take 1 tablet (0.5 mg total) by mouth 2 (two) times daily as needed. 06/07/20   Corwin Levins, MD  anastrozole (ARIMIDEX) 1 MG tablet Take 1 tablet by mouth once a week. 10/11/20   [provider]  Ascorbic Acid (VITAMIN C) 500 MG CAPS Take 1 capsule  by mouth daily at 6 (six) AM.    [provider]  aspirin EC 81 MG tablet Take 1 tablet (81 mg total) by mouth daily. Swallow whole. 01/28/22   Corwin Levins, MD  Cholecalciferol (VITAMIN D) 50 MCG (2000 UT) tablet Take 2,000 Units by mouth daily.    [provider]  Continuous Blood Gluc Receiver (FREESTYLE LIBRE 14 DAY READER) DEVI Apply 1 Device topically 4 (four) times daily as needed. E11.9 01/12/18   Corwin Levins, MD  Continuous Blood Gluc Sensor (FREESTYLE LIBRE 3 SENSOR) MISC Apply 1 application  topically every 14 (fourteen) days. Place 1 sensor on the skin every 14 days. E11.9 02/12/22   Corwin Levins, MD   ibuprofen (ADVIL) 800 MG tablet Take 1 tablet (800 mg total) by mouth 2 (two) times daily as needed. 05/20/22   Corwin Levins, MD  irbesartan (AVAPRO) 300 MG tablet Take 1 tablet (300 mg total) by mouth daily. 12/11/21   Corwin Levins, MD  levothyroxine (SYNTHROID) 25 MCG tablet TAKE 1 TABLET BY MOUTH EVERY DAY 12/11/21   Corwin Levins, MD  mefloquine (LARIAM) 250 MG tablet Take 1 tablet (250 mg total) by mouth every 7 (seven) days. Start 1 week prior to travel 06/07/21   Corwin Levins, MD  metFORMIN (GLUCOPHAGE-XR) 500 MG 24 hr tablet TAKE 4 TABLETS BY MOUTH IN THE MORNING 06/30/22   Corwin Levins, MD  omeprazole (PRILOSEC) 20 MG capsule TAKE 1 CAPSULE(20 MG) BY MOUTH TWICE DAILY 06/17/22   Corwin Levins, MD  Semaglutide, 1 MG/DOSE, 4 MG/3ML SOPN Inject 1 mg as directed once a week. 05/27/22   Corwin Levins, MD  sildenafil (VIAGRA) 100 MG tablet TakeTAKE 1/2 TO 1 TABLET(50 TO 100 MG) BY MOUTH DAILY AS NEEDED FOR ERECTILE DYSFUNCTION 07/24/22   Corwin Levins, MD  simvastatin (ZOCOR) 40 MG tablet TAKE 1 TABLET(40 MG) BY MOUTH AT BEDTIME 06/17/22   Corwin Levins, MD  terbinafine (LAMISIL) 250 MG tablet Take 1 tablet (250 mg total) by mouth daily. 02/11/22   Felecia Shelling, DPM  Testosterone Cypionate 200 MG/ML SOLN Inject 1 Dose as directed once a week.    [provider]  Zinc 100 MG TABS Take 1 tablet by mouth daily at 6 (six) AM.    [provider]  zolpidem (AMBIEN) 10 MG tablet TAKE 1 TABLET(10 MG) BY MOUTH AT BEDTIME AS NEEDED FOR SLEEP 06/30/22   Corwin Levins, MD    Family History Family History  Problem Relation Age of Onset   Diabetes Mother        most members of Maternal   Diabetes Paternal Grandfather    Colon cancer Other        neg. hx.   Heart disease Other    Hyperlipidemia Other    Hypertension Other    Stroke Other    Cancer Other    Esophageal cancer Neg Hx    Stomach cancer Neg Hx    Rectal cancer Neg Hx     Social History Social History   Tobacco Use    Smoking status: Never   Smokeless tobacco: Never   Tobacco comments:    Domestic Patent examiner   Vaping status: Never Used  Substance Use Topics   Alcohol use: Yes    Alcohol/week: 3.0 - 4.0 standard drinks of alcohol    Types: 3 - 4 Standard drinks or equivalent per week   Drug use:  No     Allergies   Lipitor [atorvastatin]   Review of Systems Review of Systems  Genitourinary:  Positive for genital sores.     Physical Exam Triage Vital Signs ED Triage Vitals [10/14/22 0934]  Encounter Vitals Group     BP (!) 162/82     Systolic BP Percentile      Diastolic BP Percentile      Pulse Rate 64     Resp 17     Temp 98.2 F (36.8 C)     Temp Source Oral     SpO2 96 %     Weight      Height      Head Circumference      Peak Flow      Pain Score 0     Pain Loc      Pain Education      Exclude from Growth Chart    No data found.  Updated Vital Signs BP (!) 162/82 (BP Location: Right Arm)   Pulse 64   Temp 98.2 F (36.8 C) (Oral)   Resp 17   SpO2 96%   Visual Acuity Right Eye Distance:   Left Eye Distance:   Bilateral Distance:    Right Eye Near:   Left Eye Near:    Bilateral Near:     Physical Exam Vitals and nursing note reviewed. Exam conducted with a chaperone present (Adriana MA).  Constitutional:      General: He is not in acute distress.    Appearance: Normal appearance. He is not ill-appearing.  HENT:     Head: Normocephalic and atraumatic.  Eyes:     Pupils: Pupils are equal, round, and reactive to light.  Cardiovascular:     Rate and Rhythm: Normal rate.  Pulmonary:     Effort: Pulmonary effort is normal.  Genitourinary:    Penis: Circumcised.        Comments: Scabbed lesion to the penile shaft with mild erythematous base without swelling, tenderness, vesicles, ulcerations, chancre,drainage. Skin:    General: Skin is warm and dry.  Neurological:     General: No focal deficit present.     Mental Status: He is alert and  oriented to person, place, and time.  Psychiatric:        Mood and Affect: Mood normal.        Behavior: Behavior normal.      UC Treatments / Results  Labs (all labs ordered are listed, but only abnormal results are displayed) Labs Reviewed  HSV CULTURE AND TYPING  RPR  HIV ANTIBODY (ROUTINE TESTING W REFLEX)  CYTOLOGY, (ORAL, ANAL, URETHRAL) ANCILLARY ONLY    EKG   Radiology No results found.  Procedures Procedures (including critical care time)  Medications Ordered in UC Medications - No data to display  Initial Impression / Assessment and Plan / UC Course  I have reviewed the triage vital signs and the nursing notes.  Pertinent labs & imaging results that were available during my care of the patient were reviewed by me and considered in my medical decision making (see chart for details).     Reviewed exam and symptoms with patient.  Discussed lesion more consistent with ingrown hair.  STD testing is ordered and will contact for any positive result.  Patient to follow-up with PCP if symptoms do not improve.  ER precautions reviewed and patient verbalized understanding. Final Clinical Impressions(s) / UC Diagnoses   Final diagnoses:  Penile lesion  Screening examination for  STD (sexually transmitted disease)     Discharge Instructions      The clinic will contact you with results of the testing done today if positive.  Please follow-up with your PCP if your symptoms do not improve.  Please go to the ER for any worsening symptoms.    ED Prescriptions   None    PDMP not reviewed this encounter.   Radford Pax, NP 10/14/22 [phone]

## 2022-10-15 LAB — RPR: RPR Ser Ql: NONREACTIVE

## 2022-10-15 LAB — HIV ANTIBODY (ROUTINE TESTING W REFLEX): HIV Screen 4th Generation wRfx: NONREACTIVE

## 2022-10-16 LAB — CYTOLOGY, (ORAL, ANAL, URETHRAL) ANCILLARY ONLY
Chlamydia: NEGATIVE
Comment: NEGATIVE
Comment: NEGATIVE
Comment: NORMAL
Neisseria Gonorrhea: NEGATIVE
Trichomonas: NEGATIVE

## 2022-10-17 LAB — HSV CULTURE AND TYPING

## 2022-11-02 ENCOUNTER — Telehealth: Payer: 59 | Admitting: Family

## 2022-11-02 DIAGNOSIS — N489 Disorder of penis, unspecified: Secondary | ICD-10-CM | POA: Diagnosis not present

## 2022-11-02 MED ORDER — VALACYCLOVIR HCL 1 G PO TABS: 1000.0000 mg | ORAL_TABLET | Freq: Two times a day (BID) | ORAL | 0 refills | Status: AC

## 2022-11-02 NOTE — Progress Notes (Signed)
Virtual Visit Consent   Troy Parsons, you are scheduled for a virtual visit with a [facility] provider today. Just as with appointments in the office, your consent must be obtained to participate. Your consent will be active for this visit and any virtual visit you may have with one of our providers in the next 365 days. If you have a MyChart account, a copy of this consent can be sent to you electronically.  As this is a virtual visit, video technology does not allow for your provider to perform a traditional examination. This may limit your provider's ability to fully assess your condition. If your provider identifies any concerns that need to be evaluated in person or the need to arrange testing (such as labs, EKG, etc.), we will make arrangements to do so. Although advances in technology are sophisticated, we cannot ensure that it will always work on either your end or our end. If the connection with a video visit is poor, the visit may have to be switched to a telephone visit. With either a video or telephone visit, we are not always able to ensure that we have a secure connection.  By engaging in this virtual visit, you consent to the provision of healthcare and authorize for your insurance to be billed (if applicable) for the services provided during this visit. Depending on your insurance coverage, you may receive a charge related to this service.  I need to obtain your verbal consent now. Are you willing to proceed with your visit today? Troy Parsons has provided verbal consent on 11/02/2022 for a virtual visit (video or telephone). Jannifer Rodney, FNP  Date: 11/02/2022 3:09 PM  Virtual Visit via Video Note   I, Jannifer Rodney, connected with  Troy Parsons  ([id number], 11-11-1960) on 11/02/22 at  3:00 PM EDT by a video-enabled telemedicine application and verified that I am speaking with the correct person using two identifiers.  Location: Patient: Virtual Visit Location Patient:  Home Provider: Virtual Visit Location Provider: Home Office   I discussed the limitations of evaluation and management by telemedicine and the availability of in person appointments. The patient expressed understanding and agreed to proceed.    History of Present Illness: Troy Parsons is a 62 y.o. who identifies as a male who was assigned male at birth, and is being seen today for penile lesion three weeks ago. Reports it started as "pimple" then crusted over and now looks like a ulcer.  He went to the Urgent care on 10/14/22 and had a negative herpes culture, gonorrhea, chlamydia, trichomonas, HIV, and syphilis was negative.   The lesion has slightly improved, but complaining of headache, flu like symptoms, blurry vision. He has been using eye drops that help. Denies any fever.   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   RUQ pain 08/16/2019   Chest pain 04/26/2018   Ingrown toenail 01/14/2018   Hypothyroidism 04/16/2017   Abdominal pain 12/25/2016  Acute diarrhea 08/07/2016   Infectious gastroenteritis 08/07/2016   Hypertension 01/17/2016   Laryngopharyngeal reflux (LPR) 11/09/2015   Sudden idiopathic hearing loss, right ear 11/09/2015   Right otitis media 11/07/2015   Eustachian tube dysfunction 11/07/2015   Insomnia 10/17/2014   Lower back pain 06/23/2014   Encounter for well adult exam with abnormal findings 01/07/2011   NONSPEC ELEVATION OF  LEVELS OF TRANSAMINASE/LDH 03/26/2010   GERD 10/22/2009   INGUINAL HERNIA, LEFT 12/13/2007   Left inguinal pain 12/13/2007   Type 2 diabetes mellitus (HCC) 11/30/2006   Allergic rhinitis 11/27/2006   Mixed hyperlipidemia 10/21/2006   GOUT, FOOT 10/17/2006   DEPRESSION 10/17/2006   ALLERGY, HX OF 10/17/2006    Allergies:  Allergies  Allergen Reactions   Lipitor [Atorvastatin] Other (See Comments)    Increased liver enxymes   Medications:  Current Outpatient Medications:    valACYclovir (VALTREX) 1000 MG tablet, Take 1 tablet (1,000 mg total) by mouth 2 (two) times daily for 10 days., Disp: 20 tablet, Rfl: 0   allopurinol (ZYLOPRIM) 300 MG tablet, TAKE 1 TABLET(300 MG) BY MOUTH DAILY, Disp: 90 tablet, Rfl: 3   ALPRAZolam (XANAX) 0.5 MG tablet, Take 1 tablet (0.5 mg total) by mouth 2 (two) times daily as needed., Disp: 60 tablet, Rfl: 0   anastrozole (ARIMIDEX) 1 MG tablet, Take 1 tablet by mouth once a week., Disp: , Rfl:    Ascorbic Acid (VITAMIN C) 500 MG CAPS, Take 1 capsule by mouth daily at 6 (six) AM., Disp: , Rfl:    aspirin EC 81 MG tablet, Take 1 tablet (81 mg total) by mouth daily. Swallow whole., Disp: 30 tablet, Rfl: 12   Cholecalciferol (VITAMIN D) 50 MCG (2000 UT) tablet, Take 2,000 Units by mouth daily., Disp: , Rfl:    Continuous Blood Gluc Receiver (FREESTYLE LIBRE 14 DAY READER) DEVI, Apply 1 Device topically 4 (four) times daily as needed. E11.9, Disp: 1 Device, Rfl: 0   Continuous Blood Gluc Sensor (FREESTYLE LIBRE 3 SENSOR) MISC, Apply 1 application  topically every 14 (fourteen) days. Place 1 sensor on the skin every 14 days. E11.9, Disp: 6 each, Rfl: 3   ibuprofen (ADVIL) 800 MG tablet, Take 1 tablet (800 mg total) by mouth 2 (two) times daily as needed., Disp: 60 tablet, Rfl: 2   irbesartan (AVAPRO) 300 MG tablet, Take 1 tablet (300 mg total) by mouth daily., Disp: 90 tablet, Rfl: 3   levothyroxine (SYNTHROID) 25 MCG tablet, TAKE 1 TABLET BY MOUTH EVERY DAY, Disp:  90 tablet, Rfl: 3   mefloquine (LARIAM) 250 MG tablet, Take 1 tablet (250 mg total) by mouth every 7 (seven) days. Start 1 week prior to travel, Disp: 6 tablet, Rfl: 0   metFORMIN (GLUCOPHAGE-XR) 500 MG 24 hr tablet, TAKE 4 TABLETS BY MOUTH IN THE MORNING, Disp: 360 tablet, Rfl: 3   omeprazole (PRILOSEC) 20 MG capsule, TAKE 1 CAPSULE(20 MG) BY MOUTH TWICE DAILY, Disp: 180 capsule, Rfl: 3   Semaglutide, 1 MG/DOSE, 4 MG/3ML SOPN, Inject 1 mg as directed once a week., Disp: 9 mL, Rfl: 1   sildenafil (VIAGRA) 100 MG tablet, TakeTAKE 1/2 TO 1 TABLET(50 TO 100 MG) BY MOUTH DAILY AS NEEDED FOR ERECTILE DYSFUNCTION, Disp: 5 tablet, Rfl: 11   simvastatin (ZOCOR) 40 MG tablet, TAKE 1 TABLET(40 MG) BY MOUTH AT BEDTIME, Disp: 90 tablet, Rfl: 3   terbinafine (LAMISIL) 250 MG tablet, Take 1 tablet (250 mg total) by mouth daily., Disp: 90 tablet, Rfl: 0  Testosterone Cypionate 200 MG/ML SOLN, Inject 1 Dose as directed once a week., Disp: , Rfl:    Zinc 100 MG TABS, Take 1 tablet by mouth daily at 6 (six) AM., Disp: , Rfl:    zolpidem (AMBIEN) 10 MG tablet, TAKE 1 TABLET(10 MG) BY MOUTH AT BEDTIME AS NEEDED FOR SLEEP, Disp: 90 tablet, Rfl: 1  Observations/Objective: Patient is well-developed, well-nourished in no acute distress.  Resting comfortably  at home.  Head is normocephalic, atraumatic.  No labored breathing.  Speech is clear and coherent with logical content.  Patient is alert and oriented at baseline.  Right eye slightly swollen   Assessment and Plan: 1. Penile lesion - valACYclovir (VALTREX) 1000 MG tablet; Take 1 tablet (1,000 mg total) by mouth 2 (two) times daily for 10 days.  Dispense: 20 tablet; Refill: 0  Will give rx of Valtrex Discussed to follow up with PCP for lab work. If negative and not herpes, may need culture to rule out infection.  Pt out of town, but will follow up with PCP this week Safe sex   Follow Up Instructions: I discussed the assessment and treatment plan with the  patient. The patient was provided an opportunity to ask questions and all were answered. The patient agreed with the plan and demonstrated an understanding of the instructions.  A copy of instructions were sent to the patient via MyChart unless otherwise noted below.     The patient was advised to call back or seek an in-person evaluation if the symptoms worsen or if the condition fails to improve as anticipated.  Time:  I spent 12 minutes with the patient via telehealth technology discussing the above problems/concerns.    Jannifer Rodney, FNP

## 2022-11-02 NOTE — Patient Instructions (Signed)
Genital Herpes Genital herpes is a common sexually transmitted infection (STI) that is caused by a virus. The virus spreads from person to person through contact with a sore, infected saliva, or infected skin. The virus can cause itching, blisters, and sores around the genitals or rectum. During an outbreak of infection, symptoms may last for several days and then go away. However, the virus remains in the body, so more outbreaks may happen in the future. The time between outbreaks varies and can be from months to years. Genital herpes can affect anyone. It is particularly concerning for pregnant women because the virus can be passed to the baby during delivery. Genital herpes is also a concern for people who have a weak disease-fighting system (immune system). What are the causes? This condition is caused by the herpes simplex virus, type 1 or type 2 (HSV-1 or HSV-2). The virus may spread through: Sexual contact with an infected person, including vaginal, anal, and oral sex. Contact with a herpes sore. The skin. This means that you can get herpes from an infected partner even if there are no blisters or sores present. Your partner may not know that he or she is infected. What increases the risk? You are more likely to develop this condition if: You have sex with many partners. You do not use latex or polyurethane condoms during sex. What are the signs or symptoms? Most people do not have symptoms or they have mild symptoms that may be mistaken for other skin problems. Symptoms may include: Small, red bumps near the genitals, rectum, or mouth. These bumps turn into blisters and then sores. Flu-like (influenza-like) symptoms, including: Fever. Body aches. Swollen lymph nodes. Headache. Painful urination. Pain and itching in the genital area or rectal area. Vaginal discharge. Tingling or shooting pain in the legs and buttocks. Generally, symptoms are more severe and last longer during the first  (primary) outbreak. Influenza-like symptoms are also more common during the primary outbreak. How is this diagnosed? This condition may be diagnosed based on: A physical exam. Your medical history. Blood tests. A test of a fluid sample (culture) from an open sore. How is this treated? There is no cure for this condition, but treatment with antiviral medicines can do the following: Speed up healing and relieve symptoms. Help to reduce the spread of the virus to sexual partners. Limit the chance of future outbreaks, or make future outbreaks shorter. Lessen symptoms of future outbreaks. Your health care provider may also recommend over-the-counter medicines to help with pain and itching. Follow these instructions at home: If you have an outbreak:  Keep the affected areas dry and clean. Avoid rubbing or touching blisters and sores. If you do touch blisters or sores: Wash your hands thoroughly with soap and water for at least 20 seconds. If soap and water are not available, use an alcohol-based hand sanitizer. Do not touch your eyes afterward. Sexual activity Do not have sexual contact during active outbreaks. Practice safe sex. Herpes can spread even if your partner does not have blisters or sores. Latex or polyurethane condoms and male condoms may help prevent the spread of the herpes virus. Managing pain and discomfort If directed, put ice on the painful area. To do this: Put ice in a plastic bag. Place a towel between your skin and the bag. Leave the ice on for 20 minutes, 2-3 times a day. Remove the ice if your skin turns bright red. This is very important. If you cannot feel pain, heat, or  cold, you have a greater risk of damage to the area. If told, take a cool sitz bath to help relieve pain or itching. A sitz bath is a water bath that you take while sitting down in water that is deep enough to cover your hips and buttocks. General instructions Take over-the-counter and  prescription medicines only as told by your health care provider. If you were prescribed an antiviral medicine, use it as told by your health care provider. Do not stop using the antiviral even if you start to feel better. Keep all follow-up visits. This is important. How is this prevented? Use condoms. Although you can get genital herpes during sexual contact even with the use of a condom, a condom can provide some protection. Avoid having multiple sexual partners. Talk with your sexual partner about any symptoms either of you may have. Also, talk with your partner about any history of STIs. Do not have sexual contact if you have active symptoms of genital herpes. Contact a health care provider if: Your symptoms are not improving with medicine. Your symptoms return, or you have new symptoms. You have a fever. You have abdominal pain. You have redness, swelling, or pain in your eye. You notice new sores on other parts of your body. You have had herpes and you become pregnant or plan to become pregnant. Get help right away if: You have symptoms of viral meningitis. This is rare but may happen if the virus spreads to the brain. Symptoms may include: Severe headache or stiff neck. Muscle aches. Nausea and vomiting. Sensitivity to light. Summary Genital herpes is a common sexually transmitted infection (STI) that is caused by the herpes simplex virus, type 1 or type 2 (HSV-1 or HSV-2). These viruses are most often spread through sexual contact with an infected person. You are more likely to develop this condition if you have sex with many partners or you do not use condoms during sex. Most people do not have symptoms or have mild symptoms that may be mistaken for other skin problems. Symptoms occur as outbreaks that may happen months or years apart. There is no cure for this condition, but treatment with oral antiviral medicines can reduce symptoms, reduce the chance of spreading the virus to  a partner, prevent future outbreaks, or shorten future outbreaks. This information is not intended to replace advice given to you by your health care provider. Make sure you discuss any questions you have with your health care provider. Document Revised: 11/01/2020 Document Reviewed: 11/01/2020 Elsevier Patient Education  2024 [employer].

## 2022-11-17 ENCOUNTER — Other Ambulatory Visit: Payer: Self-pay

## 2022-11-17 ENCOUNTER — Encounter: Payer: Self-pay | Admitting: Internal Medicine

## 2022-11-17 MED ORDER — IBUPROFEN 800 MG PO TABS: 800.0000 mg | ORAL_TABLET | Freq: Two times a day (BID) | ORAL | 2 refills | Status: DC | PRN

## 2022-11-20 ENCOUNTER — Ambulatory Visit: Payer: 59 | Admitting: Internal Medicine

## 2022-11-20 ENCOUNTER — Encounter: Payer: Self-pay | Admitting: Internal Medicine

## 2022-11-20 VITALS — BP 130/82 | HR 67 | Temp 98.0°F | Ht 69.0 in | Wt 192.0 lb

## 2022-11-20 DIAGNOSIS — Z7984 Long term (current) use of oral hypoglycemic drugs: Secondary | ICD-10-CM

## 2022-11-20 DIAGNOSIS — E1165 Type 2 diabetes mellitus with hyperglycemia: Secondary | ICD-10-CM | POA: Diagnosis not present

## 2022-11-20 DIAGNOSIS — E039 Hypothyroidism, unspecified: Secondary | ICD-10-CM

## 2022-11-20 DIAGNOSIS — Z7985 Long-term (current) use of injectable non-insulin antidiabetic drugs: Secondary | ICD-10-CM | POA: Diagnosis not present

## 2022-11-20 DIAGNOSIS — Z202 Contact with and (suspected) exposure to infections with a predominantly sexual mode of transmission: Secondary | ICD-10-CM

## 2022-11-20 DIAGNOSIS — I10 Essential (primary) hypertension: Secondary | ICD-10-CM

## 2022-11-20 DIAGNOSIS — E782 Mixed hyperlipidemia: Secondary | ICD-10-CM

## 2022-11-20 DIAGNOSIS — E559 Vitamin D deficiency, unspecified: Secondary | ICD-10-CM

## 2022-11-20 DIAGNOSIS — Z23 Encounter for immunization: Secondary | ICD-10-CM

## 2022-11-20 DIAGNOSIS — R7989 Other specified abnormal findings of blood chemistry: Secondary | ICD-10-CM

## 2022-11-20 LAB — BASIC METABOLIC PANEL
BUN: 20 mg/dL (ref 6–23)
CO2: 27 meq/L (ref 19–32)
Calcium: 10.5 mg/dL (ref 8.4–10.5)
Chloride: 100 meq/L (ref 96–112)
Creatinine, Ser: 0.91 mg/dL (ref 0.40–1.50)
GFR: 91.75 mL/min (ref >60.00)
Glucose, Bld: 102 mg/dL — ABNORMAL HIGH (ref 70–99)
Potassium: 5 meq/L (ref 3.5–5.1)
Sodium: 136 meq/L (ref 135–145)

## 2022-11-20 LAB — HEPATIC FUNCTION PANEL
ALT: 36 U/L (ref 0–53)
AST: 32 U/L (ref 0–37)
Albumin: 4.9 g/dL (ref 3.5–5.2)
Alkaline Phosphatase: 37 U/L — ABNORMAL LOW (ref 39–117)
Bilirubin, Direct: 0.1 mg/dL (ref 0.0–0.3)
Total Bilirubin: 0.6 mg/dL (ref 0.2–1.2)
Total Protein: 6.9 g/dL (ref 6.0–8.3)

## 2022-11-20 LAB — LIPID PANEL
Cholesterol: 131 mg/dL (ref 0–200)
HDL: 41.5 mg/dL (ref >39.00)
LDL Cholesterol: 45 mg/dL (ref 0–99)
NonHDL: 89.05
Total CHOL/HDL Ratio: 3
Triglycerides: 221 mg/dL — ABNORMAL HIGH (ref 0.0–149.0)
VLDL: 44.2 mg/dL — ABNORMAL HIGH (ref 0.0–40.0)

## 2022-11-20 LAB — TSH: TSH: 2.95 u[IU]/mL (ref 0.35–5.50)

## 2022-11-20 LAB — HEMOGLOBIN A1C: Hgb A1c MFr Bld: 6.2 % (ref 4.6–6.5)

## 2022-11-20 MED ORDER — SEMAGLUTIDE (2 MG/DOSE) 8 MG/3ML ~~LOC~~ SOPN: 2.0000 mg | PEN_INJECTOR | SUBCUTANEOUS | 3 refills | Status: DC

## 2022-11-20 NOTE — Assessment & Plan Note (Signed)
Lab Results  Component Value Date   LDLCALC 24 06/09/2022   Stable, pt to continue current statin zocor 40 mg qd

## 2022-11-20 NOTE — Progress Notes (Signed)
The test results show that your current treatment is OK, as the tests are stable.  Please continue the same plan.  There is no other need for change of treatment or further evaluation based on these results, at this time.  thanks 

## 2022-11-20 NOTE — Progress Notes (Signed)
Patient ID: Troy Parsons, male   DOB: 07/31/1960, 62 y.o.   MRN: [id number]        Chief Complaint: follow up feeling ill for 5-6 wks overall       HPI:  Troy Parsons is a 62 y.o. male here with STD check about 6 wks ago, he is not sure the culture for herpes was done correctly.  Has had several wks of stiffness overall, No lymph nodes felt but he still thinks might be there, and headaches. Did complete a 10 days course of valtrex per telemedicine as out of town and felt some improved, finished about 10 days ago.  No fever, rash, lymph notdes, and achiness of medical thighs. And spot on tongue not sure if related.   Pt denies chest pain, increased sob or doe, wheezing, orthopnea, PND, increased LE swelling, palpitations, dizziness or syncope.   Pt denies polydipsia, polyuria, or new focal neuro s/s.    Pt denies fever, wt loss, night sweats, loss of appetite, or other constitutional symptoms   of note, also has eye exam sched for next mont.         Wt Readings from Last 3 Encounters:  11/20/22 192 lb (87.1 kg)  06/17/22 185 lb (83.9 kg)  03/07/22 186 lb 2 oz (84.4 kg)   BP Readings from Last 3 Encounters:  11/20/22 130/82  10/14/22 (!) 162/82  06/17/22 122/78         Past Medical History:  Diagnosis Date   Abnormal liver function    Depression    on meds   Esophageal reflux    on meds   Fatty liver    Gout    Hemorrhoids    Hiatal hernia    Hyperlipemia    on meds   Hypertension    on meds   Seasonal allergies    Type II or unspecified type diabetes mellitus without mention of complication, not stated as uncontrolled    on meds   Past Surgical History:  Procedure Laterality Date   APPENDECTOMY     NASAL SEPTUM SURGERY  2010   UMBILICAL HERNIA REPAIR  2009    reports that he has never smoked. He has never used smokeless tobacco. He reports current alcohol use of about 3.0 - 4.0 standard drinks of alcohol per week. He reports that he does not use drugs. family history  includes Cancer in an other family member; Colon cancer in an other family member; Diabetes in his mother and paternal grandfather; Heart disease in an other family member; Hyperlipidemia in an other family member; Hypertension in an other family member; Stroke in an other family member. Allergies  Allergen Reactions   Lipitor [Atorvastatin] Other (See Comments)    Increased liver enxymes   Current Outpatient Medications on File Prior to Visit  Medication Sig Dispense Refill   allopurinol (ZYLOPRIM) 300 MG tablet TAKE 1 TABLET(300 MG) BY MOUTH DAILY 90 tablet 3   ALPRAZolam (XANAX) 0.5 MG tablet Take 1 tablet (0.5 mg total) by mouth 2 (two) times daily as needed. 60 tablet 0   anastrozole (ARIMIDEX) 1 MG tablet Take 1 tablet by mouth once a week.     Ascorbic Acid (VITAMIN C) 500 MG CAPS Take 1 capsule by mouth daily at 6 (six) AM.     aspirin EC 81 MG tablet Take 1 tablet (81 mg total) by mouth daily. Swallow whole. 30 tablet 12   Cholecalciferol (VITAMIN D) 50 MCG (2000 UT) tablet Take  2,000 Units by mouth daily.     Continuous Blood Gluc Receiver (FREESTYLE LIBRE 14 DAY READER) DEVI Apply 1 Device topically 4 (four) times daily as needed. E11.9 1 Device 0   Continuous Blood Gluc Sensor (FREESTYLE LIBRE 3 SENSOR) MISC Apply 1 application  topically every 14 (fourteen) days. Place 1 sensor on the skin every 14 days. E11.9 6 each 3   ibuprofen (ADVIL) 800 MG tablet Take 1 tablet (800 mg total) by mouth 2 (two) times daily as needed. 60 tablet 2   irbesartan (AVAPRO) 300 MG tablet Take 1 tablet (300 mg total) by mouth daily. 90 tablet 3   levothyroxine (SYNTHROID) 25 MCG tablet TAKE 1 TABLET BY MOUTH EVERY DAY 90 tablet 3   mefloquine (LARIAM) 250 MG tablet Take 1 tablet (250 mg total) by mouth every 7 (seven) days. Start 1 week prior to travel 6 tablet 0   metFORMIN (GLUCOPHAGE-XR) 500 MG 24 hr tablet TAKE 4 TABLETS BY MOUTH IN THE MORNING 360 tablet 3   omeprazole (PRILOSEC) 20 MG capsule TAKE  1 CAPSULE(20 MG) BY MOUTH TWICE DAILY 180 capsule 3   sildenafil (VIAGRA) 100 MG tablet TakeTAKE 1/2 TO 1 TABLET(50 TO 100 MG) BY MOUTH DAILY AS NEEDED FOR ERECTILE DYSFUNCTION 5 tablet 11   simvastatin (ZOCOR) 40 MG tablet TAKE 1 TABLET(40 MG) BY MOUTH AT BEDTIME 90 tablet 3   terbinafine (LAMISIL) 250 MG tablet Take 1 tablet (250 mg total) by mouth daily. 90 tablet 0   Testosterone Cypionate 200 MG/ML SOLN Inject 1 Dose as directed once a week.     Zinc 100 MG TABS Take 1 tablet by mouth daily at 6 (six) AM.     zolpidem (AMBIEN) 10 MG tablet TAKE 1 TABLET(10 MG) BY MOUTH AT BEDTIME AS NEEDED FOR SLEEP 90 tablet 1   No current facility-administered medications on file prior to visit.        ROS:  All others reviewed and negative.  Objective        PE:  BP 130/82 (BP Location: Right Arm, Patient Position: Sitting, Cuff Size: Normal)   Pulse 67   Temp 98 F (36.7 C) (Oral)   Ht 5\' 9"  (1.753 m)   Wt 192 lb (87.1 kg)   SpO2 99%   BMI 28.35 kg/m                 Constitutional: Pt appears in NAD               HENT: Head: NCAT.                Right Ear: External ear normal.                 Left Ear: External ear normal.                Eyes: . Pupils are equal, round, and reactive to light. Conjunctivae and EOM are normal               Nose: without d/c or deformity               Neck: Neck supple. Gross normal ROM               Cardiovascular: Normal rate and regular rhythm.                 Pulmonary/Chest: Effort normal and breath sounds without rales or wheezing.  Abd:  Soft, NT, ND, + BS, no organomegaly               Neurological: Pt is alert. At baseline orientation, motor grossly intact               Skin: Skin is warm. No rashes, no other new lesions, LE edema - none, no appreaciable palpable LA throughout               Psychiatric: Pt behavior is normal without agitation   Micro: none  Cardiac tracings I have personally interpreted today:  none  Pertinent  Radiological findings (summarize): none   Lab Results  Component Value Date   WBC 5.2 06/09/2022   HGB 15.2 06/09/2022   HCT 44.7 06/09/2022   PLT 222.0 06/09/2022   GLUCOSE 122 (H) 06/09/2022   CHOL 103 06/09/2022   TRIG 165.0 (H) 06/09/2022   HDL 45.50 06/09/2022   LDLDIRECT 57.0 11/23/2019   LDLCALC 24 06/09/2022   ALT 22 06/09/2022   AST 21 06/09/2022   NA 137 06/09/2022   K 4.4 06/09/2022   CL 102 06/09/2022   CREATININE 0.79 06/09/2022   BUN 14 06/09/2022   CO2 28 06/09/2022   TSH 3.19 06/09/2022   PSA 1.40 06/09/2022   INR 1.0 03/29/2010   HGBA1C 6.1 06/09/2022   MICROALBUR 0.7 06/09/2022   Assessment/Plan:  DELVIS KAU is a 62 y.o. White or Caucasian [1] male with  has a past medical history of Abnormal liver function, Depression, Esophageal reflux, Fatty liver, Gout, Hemorrhoids, Hiatal hernia, Hyperlipemia, Hypertension, Seasonal allergies, and Type II or unspecified type diabetes mellitus without mention of complication, not stated as uncontrolled.  Type 2 diabetes mellitus (HCC) Lab Results  Component Value Date   HGBA1C 6.1 06/09/2022   In the setting of obesity, pt to continue current medical treatment metformin ER 500 mg -4 every day, but also increase the ozempic 2 mg weekly, for A1c today and consider start reducing the metformin   Mixed hyperlipidemia Lab Results  Component Value Date   LDLCALC 24 06/09/2022   Stable, pt to continue current statin zocor 40 mg qd   Hypothyroidism Lab Results  Component Value Date   TSH 3.19 06/09/2022   Stable, pt to continue levothyroxine 25 mcg qd  Current Outpatient Medications (Endocrine & Metabolic):    levothyroxine (SYNTHROID) 25 MCG tablet, TAKE 1 TABLET BY MOUTH EVERY DAY   metFORMIN (GLUCOPHAGE-XR) 500 MG 24 hr tablet, TAKE 4 TABLETS BY MOUTH IN THE MORNING   Semaglutide, 2 MG/DOSE, 8 MG/3ML SOPN, Inject 2 mg as directed once a week.   Testosterone Cypionate 200 MG/ML SOLN, Inject 1 Dose as  directed once a week.  Current Outpatient Medications (Cardiovascular):    irbesartan (AVAPRO) 300 MG tablet, Take 1 tablet (300 mg total) by mouth daily.   sildenafil (VIAGRA) 100 MG tablet, TakeTAKE 1/2 TO 1 TABLET(50 TO 100 MG) BY MOUTH DAILY AS NEEDED FOR ERECTILE DYSFUNCTION   simvastatin (ZOCOR) 40 MG tablet, TAKE 1 TABLET(40 MG) BY MOUTH AT BEDTIME   Current Outpatient Medications (Analgesics):    allopurinol (ZYLOPRIM) 300 MG tablet, TAKE 1 TABLET(300 MG) BY MOUTH DAILY   aspirin EC 81 MG tablet, Take 1 tablet (81 mg total) by mouth daily. Swallow whole.   ibuprofen (ADVIL) 800 MG tablet, Take 1 tablet (800 mg total) by mouth 2 (two) times daily as needed.   Current Outpatient Medications (Other):    ALPRAZolam (XANAX) 0.5 MG tablet, Take  1 tablet (0.5 mg total) by mouth 2 (two) times daily as needed.   anastrozole (ARIMIDEX) 1 MG tablet, Take 1 tablet by mouth once a week.   Ascorbic Acid (VITAMIN C) 500 MG CAPS, Take 1 capsule by mouth daily at 6 (six) AM.   Cholecalciferol (VITAMIN D) 50 MCG (2000 UT) tablet, Take 2,000 Units by mouth daily.   Continuous Blood Gluc Receiver (FREESTYLE LIBRE 14 DAY READER) DEVI, Apply 1 Device topically 4 (four) times daily as needed. E11.9   Continuous Blood Gluc Sensor (FREESTYLE LIBRE 3 SENSOR) MISC, Apply 1 application  topically every 14 (fourteen) days. Place 1 sensor on the skin every 14 days. E11.9   mefloquine (LARIAM) 250 MG tablet, Take 1 tablet (250 mg total) by mouth every 7 (seven) days. Start 1 week prior to travel   omeprazole (PRILOSEC) 20 MG capsule, TAKE 1 CAPSULE(20 MG) BY MOUTH TWICE DAILY   terbinafine (LAMISIL) 250 MG tablet, Take 1 tablet (250 mg total) by mouth daily.   Zinc 100 MG TABS, Take 1 tablet by mouth daily at 6 (six) AM.   zolpidem (AMBIEN) 10 MG tablet, TAKE 1 TABLET(10 MG) BY MOUTH AT BEDTIME AS NEEDED FOR SLEEP   Hypertension BP Readings from Last 3 Encounters:  11/20/22 130/82  10/14/22 (!) 162/82   06/17/22 122/78   Stable, pt to continue medical treatment avapro 300 mg qd   Vitamin D deficiency Last vitamin D Lab Results  Component Value Date   VD25OH 52.61 06/09/2022   Stable, cont oral replacement   Low vitamin B12 level Lab Results  Component Value Date   VITAMINB12 1,148 (H) 06/09/2022   Stable, cont oral replacement - b12 1000 mcg qd   Exposure to sexually transmitted disease (STD) Recent general testing neg, pt asking also for herpes serology to further evaluate  Followup: Return in about 6 months (around 05/21/2023).  Oliver Barre, MD 11/20/2022 9:24 AM [facility]

## 2022-11-20 NOTE — Assessment & Plan Note (Signed)
Last vitamin D Lab Results  Component Value Date   VD25OH 52.61 06/09/2022   Stable, cont oral replacement

## 2022-11-20 NOTE — Assessment & Plan Note (Signed)
Lab Results  Component Value Date   TSH 3.19 06/09/2022   Stable, pt to continue levothyroxine 25 mcg qd  Current Outpatient Medications (Endocrine & Metabolic):    levothyroxine (SYNTHROID) 25 MCG tablet, TAKE 1 TABLET BY MOUTH EVERY DAY   metFORMIN (GLUCOPHAGE-XR) 500 MG 24 hr tablet, TAKE 4 TABLETS BY MOUTH IN THE MORNING   Semaglutide, 2 MG/DOSE, 8 MG/3ML SOPN, Inject 2 mg as directed once a week.   Testosterone Cypionate 200 MG/ML SOLN, Inject 1 Dose as directed once a week.  Current Outpatient Medications (Cardiovascular):    irbesartan (AVAPRO) 300 MG tablet, Take 1 tablet (300 mg total) by mouth daily.   sildenafil (VIAGRA) 100 MG tablet, TakeTAKE 1/2 TO 1 TABLET(50 TO 100 MG) BY MOUTH DAILY AS NEEDED FOR ERECTILE DYSFUNCTION   simvastatin (ZOCOR) 40 MG tablet, TAKE 1 TABLET(40 MG) BY MOUTH AT BEDTIME   Current Outpatient Medications (Analgesics):    allopurinol (ZYLOPRIM) 300 MG tablet, TAKE 1 TABLET(300 MG) BY MOUTH DAILY   aspirin EC 81 MG tablet, Take 1 tablet (81 mg total) by mouth daily. Swallow whole.   ibuprofen (ADVIL) 800 MG tablet, Take 1 tablet (800 mg total) by mouth 2 (two) times daily as needed.   Current Outpatient Medications (Other):    ALPRAZolam (XANAX) 0.5 MG tablet, Take 1 tablet (0.5 mg total) by mouth 2 (two) times daily as needed.   anastrozole (ARIMIDEX) 1 MG tablet, Take 1 tablet by mouth once a week.   Ascorbic Acid (VITAMIN C) 500 MG CAPS, Take 1 capsule by mouth daily at 6 (six) AM.   Cholecalciferol (VITAMIN D) 50 MCG (2000 UT) tablet, Take 2,000 Units by mouth daily.   Continuous Blood Gluc Receiver (FREESTYLE LIBRE 14 DAY READER) DEVI, Apply 1 Device topically 4 (four) times daily as needed. E11.9   Continuous Blood Gluc Sensor (FREESTYLE LIBRE 3 SENSOR) MISC, Apply 1 application  topically every 14 (fourteen) days. Place 1 sensor on the skin every 14 days. E11.9   mefloquine (LARIAM) 250 MG tablet, Take 1 tablet (250 mg total) by mouth every 7  (seven) days. Start 1 week prior to travel   omeprazole (PRILOSEC) 20 MG capsule, TAKE 1 CAPSULE(20 MG) BY MOUTH TWICE DAILY   terbinafine (LAMISIL) 250 MG tablet, Take 1 tablet (250 mg total) by mouth daily.   Zinc 100 MG TABS, Take 1 tablet by mouth daily at 6 (six) AM.   zolpidem (AMBIEN) 10 MG tablet, TAKE 1 TABLET(10 MG) BY MOUTH AT BEDTIME AS NEEDED FOR SLEEP

## 2022-11-20 NOTE — Assessment & Plan Note (Signed)
Lab Results  Component Value Date   HGBA1C 6.1 06/09/2022   In the setting of obesity, pt to continue current medical treatment metformin ER 500 mg -4 every day, but also increase the ozempic 2 mg weekly, for A1c today and consider start reducing the metformin

## 2022-11-20 NOTE — Assessment & Plan Note (Signed)
BP Readings from Last 3 Encounters:  11/20/22 130/82  10/14/22 (!) 162/82  06/17/22 122/78   Stable, pt to continue medical treatment avapro 300 mg qd

## 2022-11-20 NOTE — Assessment & Plan Note (Signed)
Recent general testing neg, pt asking also for herpes serology to further evaluate

## 2022-11-20 NOTE — Patient Instructions (Addendum)
Your flu shot was done today  Ok to increase the ozempic to 2 mg weekly  Please continue all other medications as before, and refills have been done if requested.  Please have the pharmacy call with any other refills you may need.  Please continue your efforts at being more active, low cholesterol diet, and weight control.  Please keep your appointments with your specialists as you may have planned  Please go to the LAB at the blood drawing area for the tests to be done  You will be contacted by phone if any changes need to be made immediately.  Otherwise, you will receive a letter about your results with an explanation, but please check with MyChart first.  Ok to cancel the appt next month  Please make an Appointment to return in 6 months, or sooner if needed, also with Lab Appointment for testing done 3-5 days before at the FIRST FLOOR Lab (so this is for TWO appointments - please see the scheduling desk as you leave)

## 2022-11-20 NOTE — Assessment & Plan Note (Signed)
Lab Results  Component Value Date   VITAMINB12 1,148 (H) 06/09/2022   Stable, cont oral replacement - b12 1000 mcg qd

## 2022-11-21 LAB — HSV(HERPES SIMPLEX VRS) I + II AB-IGG
HAV 1 IGG,TYPE SPECIFIC AB: 24.2 {index} — ABNORMAL HIGH
HSV 2 IGG,TYPE SPECIFIC AB: <0.9 {index}

## 2022-11-22 ENCOUNTER — Other Ambulatory Visit: Payer: Self-pay | Admitting: Internal Medicine

## 2022-11-22 ENCOUNTER — Encounter: Payer: Self-pay | Admitting: Internal Medicine

## 2022-11-22 MED ORDER — VALACYCLOVIR HCL 1 G PO TABS: 1000.0000 mg | ORAL_TABLET | Freq: Two times a day (BID) | ORAL | 3 refills | Status: AC

## 2022-12-17 LAB — HM DIABETES EYE EXAM

## 2022-12-18 ENCOUNTER — Ambulatory Visit: Payer: 59 | Admitting: Internal Medicine

## 2023-01-11 ENCOUNTER — Other Ambulatory Visit: Payer: Self-pay | Admitting: Internal Medicine

## 2023-01-11 ENCOUNTER — Encounter: Payer: Self-pay | Admitting: Internal Medicine

## 2023-01-12 ENCOUNTER — Other Ambulatory Visit: Payer: Self-pay

## 2023-01-16 ENCOUNTER — Encounter: Payer: Self-pay | Admitting: Internal Medicine

## 2023-01-16 ENCOUNTER — Other Ambulatory Visit: Payer: Self-pay

## 2023-01-16 MED ORDER — IRBESARTAN 300 MG PO TABS: 300.0000 mg | ORAL_TABLET | Freq: Every day | ORAL | 3 refills | Status: DC

## 2023-02-11 ENCOUNTER — Encounter: Payer: Self-pay | Admitting: Internal Medicine

## 2023-02-12 MED ORDER — TADALAFIL 20 MG PO TABS: 20.0000 mg | ORAL_TABLET | Freq: Every day | ORAL | 11 refills | Status: DC | PRN

## 2023-02-23 ENCOUNTER — Other Ambulatory Visit: Payer: Self-pay | Admitting: Internal Medicine

## 2023-02-24 ENCOUNTER — Other Ambulatory Visit: Payer: Self-pay

## 2023-02-24 ENCOUNTER — Encounter: Payer: Self-pay | Admitting: Internal Medicine

## 2023-03-17 ENCOUNTER — Encounter: Payer: Self-pay | Admitting: Family Medicine

## 2023-03-17 ENCOUNTER — Ambulatory Visit (INDEPENDENT_AMBULATORY_CARE_PROVIDER_SITE_OTHER): Payer: 59 | Admitting: Family Medicine

## 2023-03-17 VITALS — BP 122/80 | Ht 69.0 in | Wt 195.0 lb

## 2023-03-17 DIAGNOSIS — M75101 Unspecified rotator cuff tear or rupture of right shoulder, not specified as traumatic: Secondary | ICD-10-CM | POA: Diagnosis not present

## 2023-03-17 DIAGNOSIS — E1165 Type 2 diabetes mellitus with hyperglycemia: Secondary | ICD-10-CM

## 2023-03-17 DIAGNOSIS — M25571 Pain in right ankle and joints of right foot: Secondary | ICD-10-CM | POA: Diagnosis not present

## 2023-03-17 MED ORDER — METHYLPREDNISOLONE ACETATE 40 MG/ML IJ SUSP
40.0000 mg | Freq: Once | INTRAMUSCULAR | Status: AC
Start: 2023-03-17 — End: 2023-03-17
  Administered 2023-03-17: 40 mg via INTRA_ARTICULAR

## 2023-03-17 NOTE — Assessment & Plan Note (Signed)
-  Controlled, last A1c 6.2 on 11/20/2022 currently taking Ozempic  and metformin   PLAN: -Went subacromial cortisone injection today.  He has a CGM.  Was advised that he can expect his blood sugars to be a little bit higher than normal over the next several days.  This is typically transient.  If continues to have elevated blood sugars should reach out and let us  know  -Will continue current regimen of Ozempic  and metformin

## 2023-03-17 NOTE — Progress Notes (Signed)
 DATE OF VISIT: 03/17/2023        Troy Parsons Brain DOB: October 10, 1960 MRN: [id number]  CC:  Rt shoulder pain  History- Troy Parsons is a 63 y.o. RT-hand dominant male for evaluation and treatment of Rt shoulder pain Having pain in the right lateral shoulder over the last 2 weeks.  Denies any injury or trauma Typically works out 5 days a week in the gym.  No changes to his exercise program. Some pain when lifting the arm.  Some pain with push-ups. Has been using ibuprofen  800 mg up to twice daily as needed, also using topical lidocaine Has been improving, but still pain.  Today rates pain 3/10 overall Denies any prior shoulder issues Denies any night pain Denies any numbness, tingling, weakness No prior imaging  Patient is a diabetic.  He uses a CGM Taking metformin  and Ozempic  States last A1c was 6.2 Average blood sugar is around 120-130   Past Medical History Past Medical History:  Diagnosis Date   Abnormal liver function    Depression    on meds   Esophageal reflux    on meds   Fatty liver    Gout    Hemorrhoids    Hiatal hernia    Hyperlipemia    on meds   Hypertension    on meds   Seasonal allergies    Type II or unspecified type diabetes mellitus without mention of complication, not stated as uncontrolled    on meds    Past Surgical History Past Surgical History:  Procedure Laterality Date   APPENDECTOMY     NASAL SEPTUM SURGERY  2010   UMBILICAL HERNIA REPAIR  2009    Medications Current Outpatient Medications  Medication Sig Dispense Refill   levothyroxine  (SYNTHROID ) 25 MCG tablet TAKE 1 TABLET BY MOUTH EVERY DAY 90 tablet 3   allopurinol  (ZYLOPRIM ) 300 MG tablet TAKE 1 TABLET(300 MG) BY MOUTH DAILY 90 tablet 3   ALPRAZolam  (XANAX ) 0.5 MG tablet Take 1 tablet (0.5 mg total) by mouth 2 (two) times daily as needed. 60 tablet 0   anastrozole (ARIMIDEX) 1 MG tablet Take 1 tablet by mouth once a week.     Ascorbic Acid (VITAMIN C) 500 MG CAPS Take 1 capsule by  mouth daily at 6 (six) AM.     aspirin  EC 81 MG tablet Take 1 tablet (81 mg total) by mouth daily. Swallow whole. 30 tablet 12   Cholecalciferol (VITAMIN D ) 50 MCG (2000 UT) tablet Take 2,000 Units by mouth daily.     Continuous Blood Gluc Receiver (FREESTYLE LIBRE 14 DAY READER) DEVI Apply 1 Device topically 4 (four) times daily as needed. E11.9 1 Device 0   Continuous Glucose Sensor (FREESTYLE LIBRE 3 PLUS SENSOR) MISC PLACE 1 SENSOR ON THE SKIN EVERY 14 DAYS 6 each 3   ibuprofen  (ADVIL ) 800 MG tablet Take 1 tablet (800 mg total) by mouth 2 (two) times daily as needed. 60 tablet 2   irbesartan  (AVAPRO ) 300 MG tablet Take 1 tablet (300 mg total) by mouth daily. 90 tablet 3   mefloquine  (LARIAM ) 250 MG tablet Take 1 tablet (250 mg total) by mouth every 7 (seven) days. Start 1 week prior to travel 6 tablet 0   metFORMIN  (GLUCOPHAGE -XR) 500 MG 24 hr tablet TAKE 4 TABLETS BY MOUTH IN THE MORNING 360 tablet 3   omeprazole  (PRILOSEC) 20 MG capsule TAKE 1 CAPSULE(20 MG) BY MOUTH TWICE DAILY 180 capsule 3   Semaglutide , 2 MG/DOSE, 8 MG/3ML  SOPN Inject 2 mg as directed once a week. 9 mL 3   sildenafil  (VIAGRA ) 100 MG tablet TakeTAKE 1/2 TO 1 TABLET(50 TO 100 MG) BY MOUTH DAILY AS NEEDED FOR ERECTILE DYSFUNCTION 5 tablet 11   simvastatin  (ZOCOR ) 40 MG tablet TAKE 1 TABLET(40 MG) BY MOUTH AT BEDTIME 90 tablet 3   tadalafil  (CIALIS ) 20 MG tablet Take 1 tablet (20 mg total) by mouth daily as needed for erectile dysfunction. 10 tablet 11   terbinafine  (LAMISIL ) 250 MG tablet Take 1 tablet (250 mg total) by mouth daily. 90 tablet 0   Testosterone Cypionate 200 MG/ML SOLN Inject 1 Dose as directed once a week.     Zinc 100 MG TABS Take 1 tablet by mouth daily at 6 (six) AM.     zolpidem  (AMBIEN ) 10 MG tablet TAKE 1 TABLET(10 MG) BY MOUTH AT BEDTIME AS NEEDED FOR SLEEP 90 tablet 1   No current facility-administered medications for this visit.    Allergies is allergic to lipitor [atorvastatin ].  Family  History - reviewed per EMR and intake form  Social History   reports current alcohol use of about 3.0 - 4.0 standard drinks of alcohol per week.  reports that he has never smoked. He has never used smokeless tobacco.  reports no history of drug use. 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  Mild tenderness palpation over the bicipital groove and greater tuberosity.  No tenderness over the [facility] joint.  Positive empty can, negative Hawkins, negative Neer.  Rotator cuff strength 5 -/5 with abduction and external rotation, otherwise 5/5 throughout. Left shoulder with full range of motion without pain, weakness, instability Normal grip strength  NEURO: sensation intact to light touch, DTR + 2/4, tricep, brachioradialis bilaterally VASC: pulses 2+ and symmetric artery bilaterally, no edema   Assessment & Plan Rotator cuff syndrome, right Acute right shoulder pain x 2 weeks without any specific injury or trauma.  Exam and history most consistent with rotator cuff syndrome, may have subacromial bursitis/impingement as well  Plan: -Diagnosis and treatment discussed -Discussed cortisone injection.  He is a diabetic and is currently well-controlled.  He is interested in this.  I think this will speed his recovery.  Right shoulder subacromial injection was completed today.  tolerated well.  PROCEDURE:  Risks & benefits of RT shoulder subacromial injection reviewed. Consent obtained. Time-out completed. Patient prepped and draped in the normal fashion. Area cleansed with alcohol. Ethyl chloride spray used to  anesthetize the skin. Solution of 4 mL 1% lidocaine with 1 mL methylprednisolone  (Depo-medrol ) 40mg /mL injected into the RT subacromial space using a 25-gauge 1.5-inch needle via the posterior approach. Patient tolerated procedure well without any complications. Area covered with adhesive bandage. Post-procedure care reviewed. All questions answered.  -Continue ibuprofen  800 mg twice daily to 3 times daily as needed.  Advised should take with food to avoid GI upset -Can continue topical lidocaine as needed -Recommended therapy.  Discussed home exercise program versus formal PT.  He would like to proceed with HEP.  A home exercise program was provided today -Was recommended to rest from  upper body work in the gym over the next 1 to 2 weeks, then progress as tolerated if improving.  Is okay for him to do cardio and lower body workouts in the gym -Follow-up with me in 6 weeks for reevaluation, if no improvement consider ultrasound Type 2 diabetes mellitus with hyperglycemia, without long-term current use of insulin (HCC) -Controlled, last A1c 6.2 on 11/20/2022 currently taking Ozempic  and metformin   PLAN: -Went subacromial cortisone injection today.  He has a CGM.  Was advised that he can expect his blood sugars to be a little bit higher than normal over the next several days.  This is typically transient.  If continues to have elevated blood sugars should reach out and let us  know  -Will continue current regimen of Ozempic  and metformin  Acute right ankle pain At end of visit had mentioned having some intermittent right lateral ankle pain.  Seems to be along the peroneal tendons.  No specific injury or trauma.  Has overall good alignment while wearing his shoes.  Plan: -Can continue ibuprofen  800 mg 3 times daily as needed -Heat or ice as needed -Should follow-up for further evaluation if worsening or not improving  Patient expressed understanding & agreement with above.  Encounter Diagnoses   Name Primary?   Rotator cuff syndrome, right Yes   Type 2 diabetes mellitus with hyperglycemia, without long-term current use of insulin (HCC)    Acute right ankle pain     No orders of the defined types were placed in this encounter.   No orders of the defined types were placed in this encounter.

## 2023-03-17 NOTE — Patient Instructions (Signed)

## 2023-04-10 ENCOUNTER — Encounter: Payer: Self-pay | Admitting: Internal Medicine

## 2023-04-10 ENCOUNTER — Ambulatory Visit (INDEPENDENT_AMBULATORY_CARE_PROVIDER_SITE_OTHER): Payer: 59 | Admitting: Internal Medicine

## 2023-04-10 VITALS — BP 138/82 | HR 60 | Temp 97.9°F | Ht 69.0 in | Wt 197.0 lb

## 2023-04-10 DIAGNOSIS — Z125 Encounter for screening for malignant neoplasm of prostate: Secondary | ICD-10-CM

## 2023-04-10 DIAGNOSIS — E1165 Type 2 diabetes mellitus with hyperglycemia: Secondary | ICD-10-CM

## 2023-04-10 DIAGNOSIS — Z7984 Long term (current) use of oral hypoglycemic drugs: Secondary | ICD-10-CM

## 2023-04-10 DIAGNOSIS — E559 Vitamin D deficiency, unspecified: Secondary | ICD-10-CM | POA: Diagnosis not present

## 2023-04-10 DIAGNOSIS — R7989 Other specified abnormal findings of blood chemistry: Secondary | ICD-10-CM

## 2023-04-10 DIAGNOSIS — J309 Allergic rhinitis, unspecified: Secondary | ICD-10-CM | POA: Diagnosis not present

## 2023-04-10 DIAGNOSIS — G500 Trigeminal neuralgia: Secondary | ICD-10-CM

## 2023-04-10 DIAGNOSIS — Z7985 Long-term (current) use of injectable non-insulin antidiabetic drugs: Secondary | ICD-10-CM

## 2023-04-10 MED ORDER — IBUPROFEN 800 MG PO TABS: 800.0000 mg | ORAL_TABLET | Freq: Two times a day (BID) | ORAL | 2 refills | Status: DC | PRN

## 2023-04-10 MED ORDER — CARBAMAZEPINE ER 200 MG PO TB12: 200.0000 mg | ORAL_TABLET | Freq: Two times a day (BID) | ORAL | 5 refills | Status: DC

## 2023-04-10 NOTE — Progress Notes (Signed)
 Patient ID: Troy Parsons, male   DOB: 24-May-1960, 63 y.o.   MRN: [id number]         Chief Complaint::  Trigeminal Neuralgia (Has been going on for about 2 to 3 weeks , has never had this before )        HPI:  Troy Parsons is a 63 y.o. male here with 2 wks onset left facial pain and sensitivity to touch without swelling, trauma, overall moderate, has already seen dental and ENT and suggested he may have trigeminal neuralgia.  Pt denies chest pain, increased sob or doe, wheezing, orthopnea, PND, increased LE swelling, palpitations, dizziness or syncope.   Pt denies polydipsia, polyuria, or new focal neuro s/s.   Does have several wks ongoing nasal allergy symptoms with clearish congestion, itch and sneezing, without fever, pain, ST, cough, swelling or wheezing.   Wt Readings from Last 3 Encounters:  04/10/23 197 lb (89.4 kg)  03/17/23 195 lb (88.5 kg)  11/20/22 192 lb (87.1 kg)   BP Readings from Last 3 Encounters:  04/10/23 138/82  03/17/23 122/80  11/20/22 130/82   Immunization History  Administered Date(s) Administered   Influenza Whole 11/22/2007, 12/18/2008   Influenza, Seasonal, Injecte, Preservative Fre 11/20/2022   Influenza,inj,Quad PF,6+ Mos 01/19/2013, 12/13/2017, 10/27/2018, 11/29/2019, 12/05/2020, 12/11/2021   Influenza-Unspecified 11/28/2015, 10/30/2016, 12/11/2017   MMR 06/16/2017   PFIZER(Purple Top)SARS-COV-2 Vaccination 04/06/2019, 05/04/2019, 12/29/2019   Pneumococcal Conjugate-13 03/30/2015   Pneumococcal Polysaccharide-23 04/16/2017   Td 12/29/2008   Tdap 06/07/2020   Zoster Recombinant(Shingrix) 08/27/2020, 11/19/2020  There are no preventive care reminders to display for this patient.    Past Medical History:  Diagnosis Date   Abnormal liver function    Depression    on meds   Esophageal reflux    on meds   Fatty liver    Gout    Hemorrhoids    Hiatal hernia    Hyperlipemia    on meds   Hypertension    on meds   Seasonal allergies    Type II  or unspecified type diabetes mellitus without mention of complication, not stated as uncontrolled    on meds   Past Surgical History:  Procedure Laterality Date   APPENDECTOMY     NASAL SEPTUM SURGERY  2010   UMBILICAL HERNIA REPAIR  2009    reports that he has never smoked. He has never used smokeless tobacco. He reports current alcohol use of about 3.0 - 4.0 standard drinks of alcohol per week. He reports that he does not use drugs. family history includes Cancer in an other family member; Colon cancer in an other family member; Diabetes in his mother and paternal grandfather; Heart disease in an other family member; Hyperlipidemia in an other family member; Hypertension in an other family member; Stroke in an other family member. Allergies  Allergen Reactions   Lipitor [Atorvastatin] Other (See Comments)    Increased liver enxymes   Current Outpatient Medications on File Prior to Visit  Medication Sig Dispense Refill   allopurinol (ZYLOPRIM) 300 MG tablet TAKE 1 TABLET(300 MG) BY MOUTH DAILY 90 tablet 3   ALPRAZolam (XANAX) 0.5 MG tablet Take 1 tablet (0.5 mg total) by mouth 2 (two) times daily as needed. 60 tablet 0   anastrozole (ARIMIDEX) 1 MG tablet Take 1 tablet by mouth once a week.     Ascorbic Acid (VITAMIN C) 500 MG CAPS Take 1 capsule by mouth daily at 6 (six) AM.     aspirin  EC 81 MG tablet Take 1 tablet (81 mg total) by mouth daily. Swallow whole. 30 tablet 12   Cholecalciferol (VITAMIN D) 50 MCG (2000 UT) tablet Take 2,000 Units by mouth daily.     Continuous Blood Gluc Receiver (FREESTYLE LIBRE 14 DAY READER) DEVI Apply 1 Device topically 4 (four) times daily as needed. E11.9 1 Device 0   Continuous Glucose Sensor (FREESTYLE LIBRE 3 PLUS SENSOR) MISC PLACE 1 SENSOR ON THE SKIN EVERY 14 DAYS 6 each 3   irbesartan (AVAPRO) 300 MG tablet Take 1 tablet (300 mg total) by mouth daily. 90 tablet 3   levothyroxine (SYNTHROID) 25 MCG tablet TAKE 1 TABLET BY MOUTH EVERY DAY 90  tablet 3   mefloquine (LARIAM) 250 MG tablet Take 1 tablet (250 mg total) by mouth every 7 (seven) days. Start 1 week prior to travel 6 tablet 0   metFORMIN (GLUCOPHAGE-XR) 500 MG 24 hr tablet TAKE 4 TABLETS BY MOUTH IN THE MORNING 360 tablet 3   omeprazole (PRILOSEC) 20 MG capsule TAKE 1 CAPSULE(20 MG) BY MOUTH TWICE DAILY 180 capsule 3   Semaglutide, 2 MG/DOSE, 8 MG/3ML SOPN Inject 2 mg as directed once a week. 9 mL 3   sildenafil (VIAGRA) 100 MG tablet TakeTAKE 1/2 TO 1 TABLET(50 TO 100 MG) BY MOUTH DAILY AS NEEDED FOR ERECTILE DYSFUNCTION 5 tablet 11   simvastatin (ZOCOR) 40 MG tablet TAKE 1 TABLET(40 MG) BY MOUTH AT BEDTIME 90 tablet 3   terbinafine (LAMISIL) 250 MG tablet Take 1 tablet (250 mg total) by mouth daily. 90 tablet 0   Testosterone Cypionate 200 MG/ML SOLN Inject 1 Dose as directed once a week.     valACYclovir (VALTREX) 1000 MG tablet Take 1,000 mg by mouth 2 (two) times daily.     Zinc 100 MG TABS Take 1 tablet by mouth daily at 6 (six) AM.     zolpidem (AMBIEN) 10 MG tablet TAKE 1 TABLET(10 MG) BY MOUTH AT BEDTIME AS NEEDED FOR SLEEP 90 tablet 1   tadalafil (CIALIS) 20 MG tablet Take 1 tablet (20 mg total) by mouth daily as needed for erectile dysfunction. 10 tablet 11   No current facility-administered medications on file prior to visit.        ROS:  All others reviewed and negative.  Objective        PE:  BP 138/82 (BP Location: Right Arm, Patient Position: Sitting, Cuff Size: Normal)   Pulse 60   Temp 97.9 F (36.6 C) (Oral)   Ht 5\' 9"  (1.753 m)   Wt 197 lb (89.4 kg)   SpO2 99%   BMI 29.09 kg/m                 Constitutional: Pt appears in NAD               HENT: Head: NCAT.                Right Ear: External ear normal.                 Left Ear: External ear normal.                Eyes: . Pupils are equal, round, and reactive to light. Conjunctivae and EOM are normal               Nose: without d/c or deformity               Neck: Neck supple. Gross normal  ROM               Cardiovascular: Normal rate and regular rhythm.                 Pulmonary/Chest: Effort normal and breath sounds without rales or wheezing.                Abd:  Soft, NT, ND, + BS, no organomegaly               Neurological: Pt is alert. At baseline orientation, motor grossly intact               Skin: Skin is warm. No rashes, no other new lesions, LE edema - none               Psychiatric: Pt behavior is normal without agitation   Micro: none  Cardiac tracings I have personally interpreted today:  none  Pertinent Radiological findings (summarize): none   Lab Results  Component Value Date   WBC 5.2 06/09/2022   HGB 15.2 06/09/2022   HCT 44.7 06/09/2022   PLT 222.0 06/09/2022   GLUCOSE 102 (H) 11/20/2022   CHOL 131 11/20/2022   TRIG 221.0 (H) 11/20/2022   HDL 41.50 11/20/2022   LDLDIRECT 57.0 11/23/2019   LDLCALC 45 11/20/2022   ALT 36 11/20/2022   AST 32 11/20/2022   NA 136 11/20/2022   K 5.0 11/20/2022   CL 100 11/20/2022   CREATININE 0.91 11/20/2022   BUN 20 11/20/2022   CO2 27 11/20/2022   TSH 2.95 11/20/2022   PSA 1.40 06/09/2022   INR 1.0 03/29/2010   HGBA1C 6.2 11/20/2022   MICROALBUR 0.7 06/09/2022   Assessment/Plan:  Troy Parsons is a 63 y.o. White or Caucasian [1] male with  has a past medical history of Abnormal liver function, Depression, Esophageal reflux, Fatty liver, Gout, Hemorrhoids, Hiatal hernia, Hyperlipemia, Hypertension, Seasonal allergies, and Type II or unspecified type diabetes mellitus without mention of complication, not stated as uncontrolled.  Trigeminal neuralgia of left side of face Mild to mod, for tegretol ER 200 bid, refer neurology, to f/u any worsening symptoms or concerns  Low vitamin B12 level Lab Results  Component Value Date   VITAMINB12 1,148 (H) 06/09/2022   Stable, cont oral replacement - b12 1000 mcg qd   Type 2 diabetes mellitus (HCC) Lab Results  Component Value Date   HGBA1C 6.2 11/20/2022    Stable, pt to continue current medical treatment metformin ER 500 mg - 4 every day, ozempic 2 mg weekly   Vitamin D deficiency Last vitamin D Lab Results  Component Value Date   VD25OH 52.61 06/09/2022   Stable, cont oral replacement   Allergic rhinitis Mild to mod, for otc allegra and/or nasacort asd,,  to f/u any worsening symptoms or concerns Followup: Return in about 2 months (around 06/08/2023).  Oliver Barre, MD 04/12/2023 9:00 PM [facility]

## 2023-04-10 NOTE — Patient Instructions (Signed)
 Please take all new medication as prescribed - the tegretol XR 200 mg twice per day  Please continue all other medications as before, and refills have been done if requested - the ibuprofen  Please have the pharmacy call with any other refills you may need.  Please keep your appointments with your specialists as you may have planned  You will be contacted regarding the referral for: Neurology  Please make an Appointment to return in about 2  months, or sooner if needed, also with Lab Appointment for testing done 3-5 days before at the FIRST FLOOR Lab (so this is for TWO appointments - please see the scheduling desk as you leave)

## 2023-04-12 ENCOUNTER — Encounter: Payer: Self-pay | Admitting: Internal Medicine

## 2023-04-12 DIAGNOSIS — G500 Trigeminal neuralgia: Secondary | ICD-10-CM | POA: Insufficient documentation

## 2023-04-12 NOTE — Assessment & Plan Note (Signed)
Last vitamin D Lab Results  Component Value Date   VD25OH 52.61 06/09/2022   Stable, cont oral replacement

## 2023-04-12 NOTE — Assessment & Plan Note (Signed)
Lab Results  Component Value Date   VITAMINB12 1,148 (H) 06/09/2022   Stable, cont oral replacement - b12 1000 mcg qd

## 2023-04-12 NOTE — Assessment & Plan Note (Signed)
 Mild to mod, for tegretol ER 200 bid, refer neurology, to f/u any worsening symptoms or concerns

## 2023-04-12 NOTE — Assessment & Plan Note (Signed)
Mild to mod, for otc allegra and/or nasacort asd,,  to f/u any worsening symptoms or concerns

## 2023-04-12 NOTE — Assessment & Plan Note (Signed)
 Lab Results  Component Value Date   HGBA1C 6.2 11/20/2022   Stable, pt to continue current medical treatment metformin ER 500 mg - 4 every day, ozempic 2 mg weekly

## 2023-04-21 IMAGING — MR MR HEAD WO/W CM
11 of 12 series · 37 of 48 positions shown · IV contrast (20 ML MULTIHANCE)
Comparison: None.

CLINICAL DATA: Asymmetric sensorineural hearing loss

EXAM:
MRI HEAD WITHOUT AND WITH CONTRAST
TECHNIQUE: Multiplanar, multiecho pulse sequences of the brain and surrounding
structures were obtained without and with intravenous contrast.
CONTRAST:  20mL MULTIHANCE GADOBENATE DIMEGLUMINE 529 MG/ML IV SOLN

[Series 2: T1 · sagittal · 5.0mm · 0.45mm/px · 3 of 21 slices shown (1 of 3)]
[im 1/21]
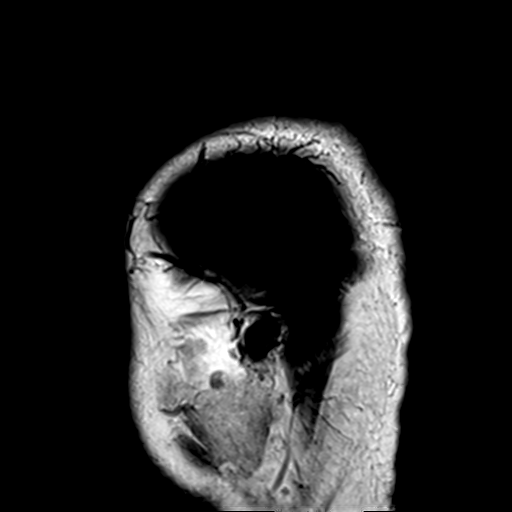
[im 11/21]
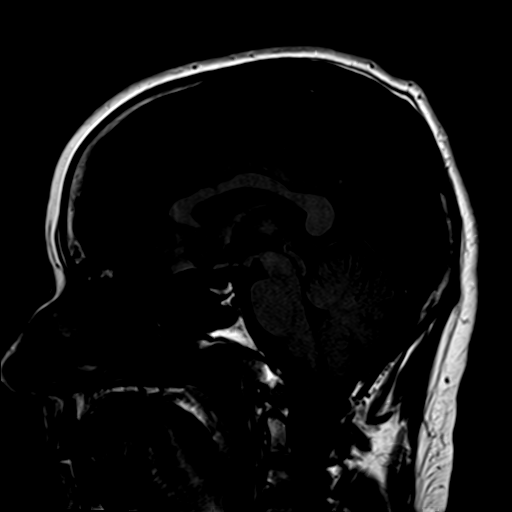
[im 21/21]
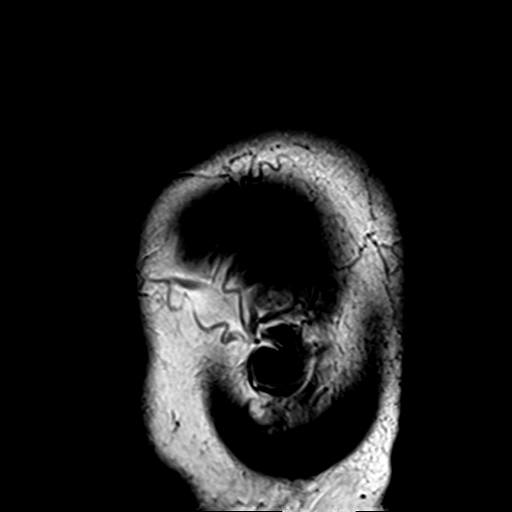

[Series 3: DWI · axial · 3.0mm · 1.80mm/px · z∈[-70,+77]mm · 9 of 100 slices shown (1 of 2)]
[im 1/100]
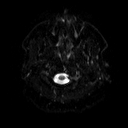
[im 19/100]
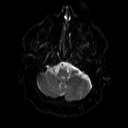
[im 28/100]
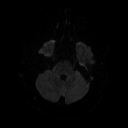
[im 46/100]
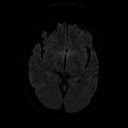
[im 55/100]
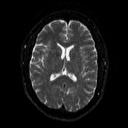
[im 73/100]
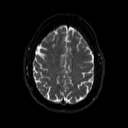
[im 82/100]
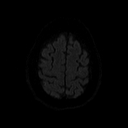
[im 91/100]
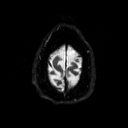
[im 100/100]
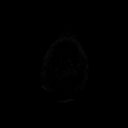

[Series 4: DWI · axial · 3.0mm · 1.80mm/px · z∈[-70,+77]mm · 6 of 48 slices shown (2 of 2)]
[im 1/48]
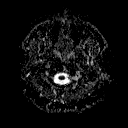
[im 10/48]
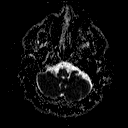
[im 19/48]
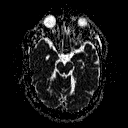
[im 29/48]
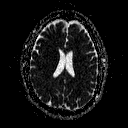
[im 38/48]
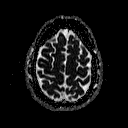
[im 48/48]
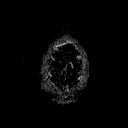

[Series 5: T2 · axial · 5.0mm · 0.45mm/px · z∈[-60,+83]mm · 3 of 23 slices shown]
[im 1/23]
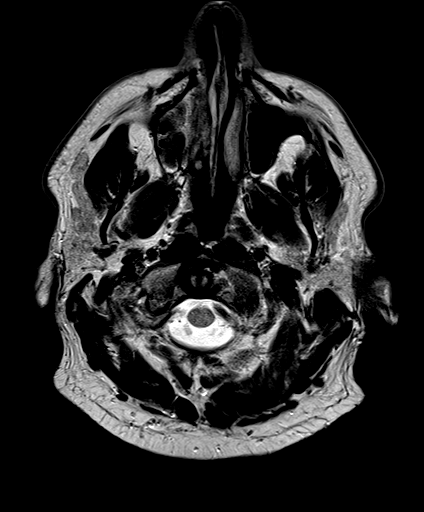
[im 12/23]
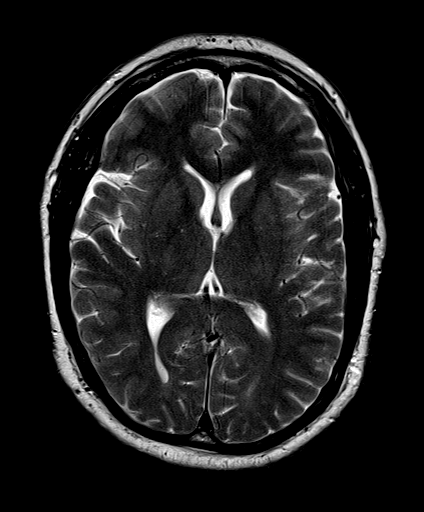
[im 23/23]
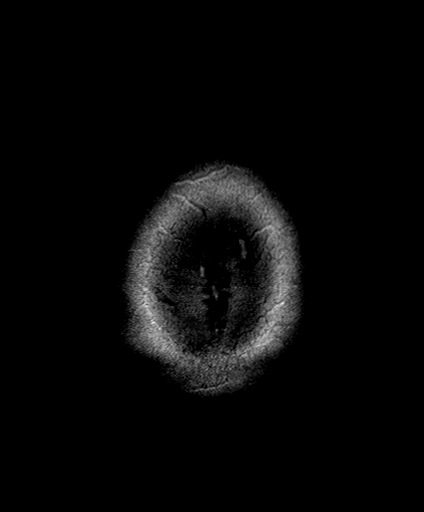

[Series 6: FLAIR · axial · 3.0mm · 0.45mm/px · z∈[-66,+89]mm · 3 of 27 slices shown]
[im 1/27]
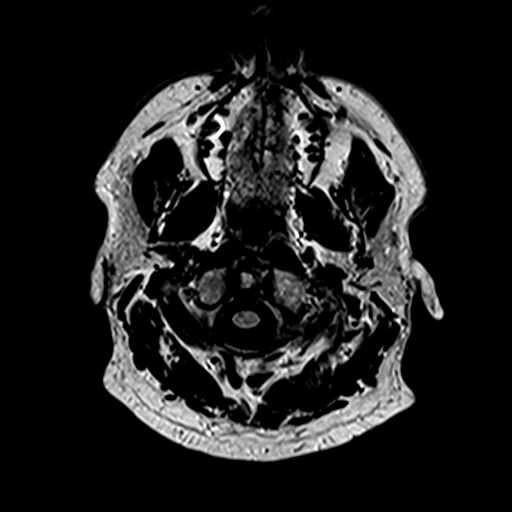
[im 14/27]
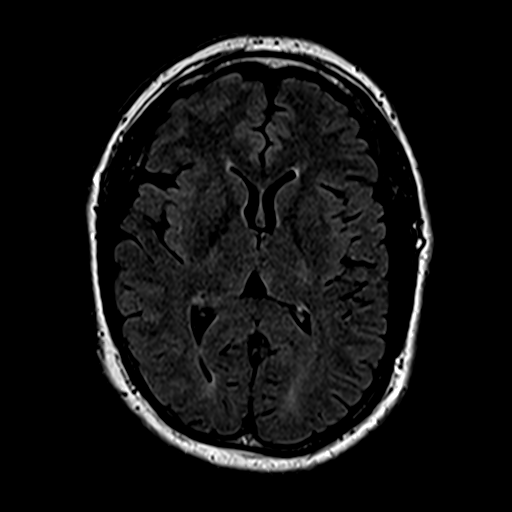
[im 27/27]
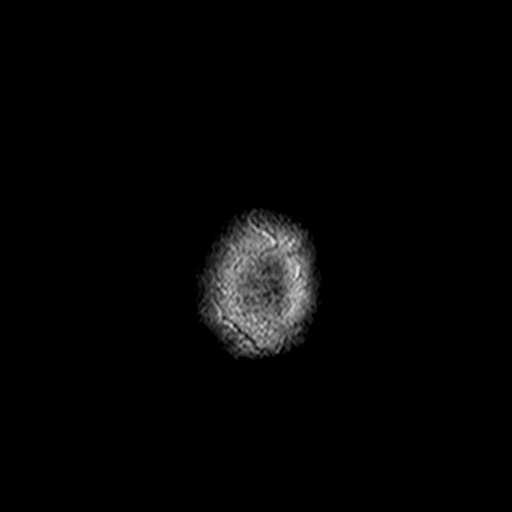

[Series 7: T1 · coronal · 3.0mm · 0.35mm/px · 1 of 11 slices shown (2 of 3)]
[im 1/11]
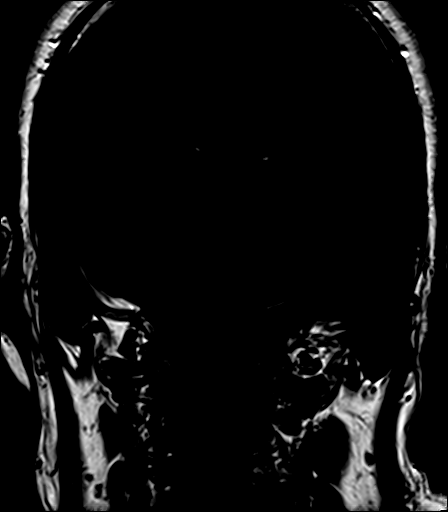

[Series 9: swi_images · axial · 3.0mm · 0.90mm/px · z∈[-59,+82]mm · 5 of 48 slices shown]
[im 1/48]
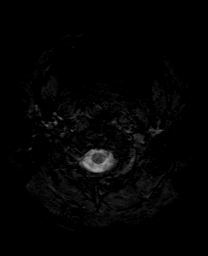
[im 12/48]
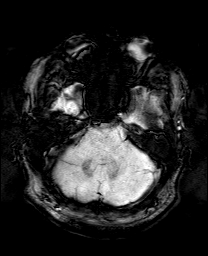
[im 24/48]
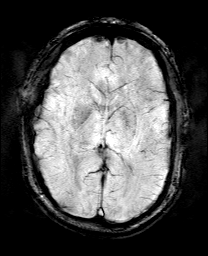
[im 36/48]
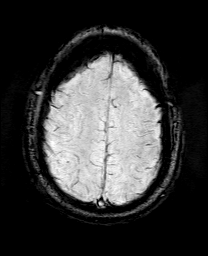
[im 48/48]
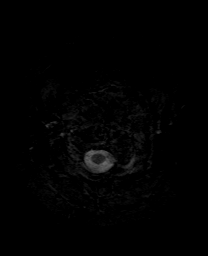

[Series 10: T1 · axial · 3.0mm · 0.35mm/px · 1 of 11 slices shown (3 of 3)]
[im 1/11]
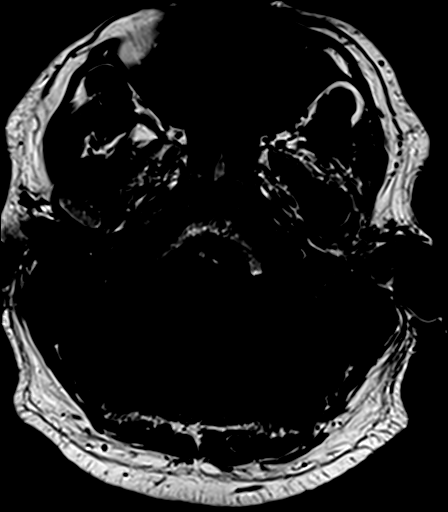

[Series 11: bSSFP · axial · 1.0mm · 0.28mm/px · z∈[-34,+0]mm · 4 of 36 slices shown]
[im 1/36]
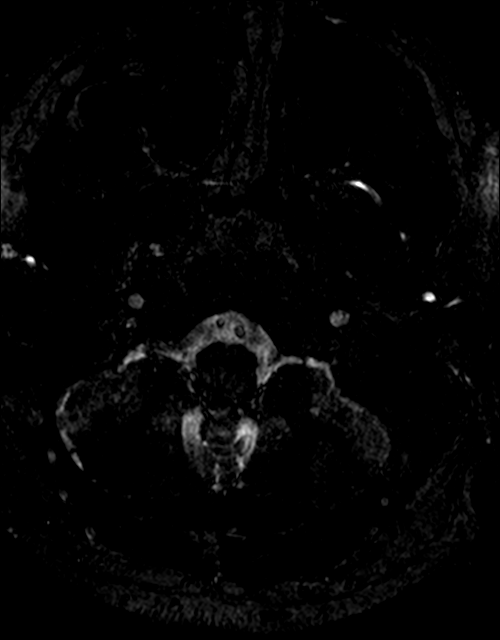
[im 12/36]
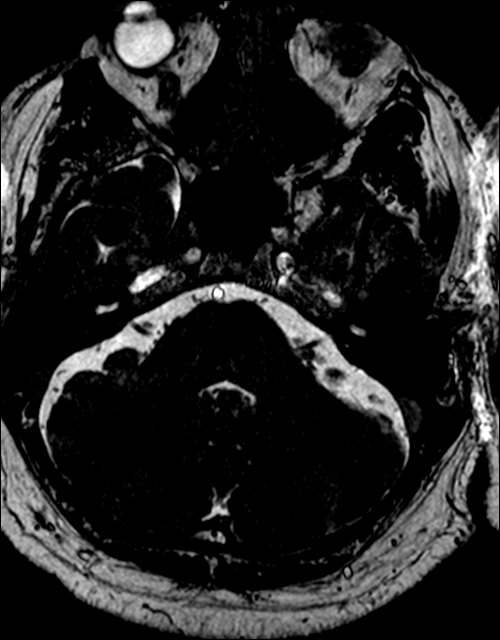
[im 24/36]
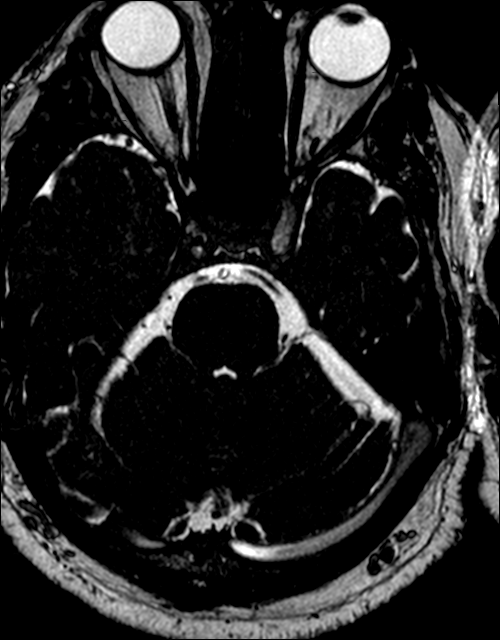
[im 36/36]
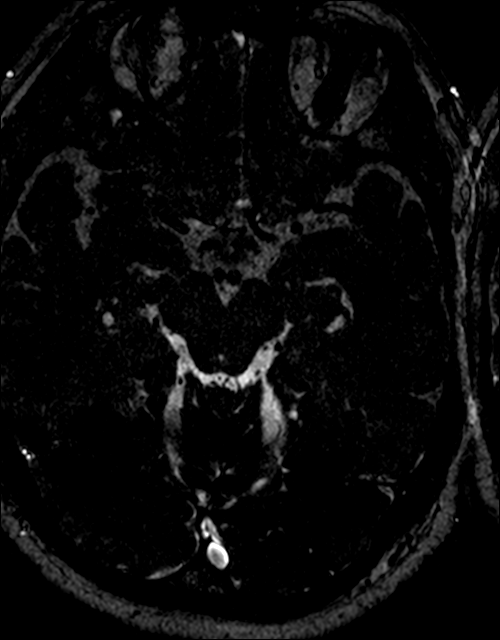

[Series 12: T1 post-contrast · coronal · 3.0mm · 0.35mm/px · 1 of 11 slices shown (1 of 2)]
[im 1/11]
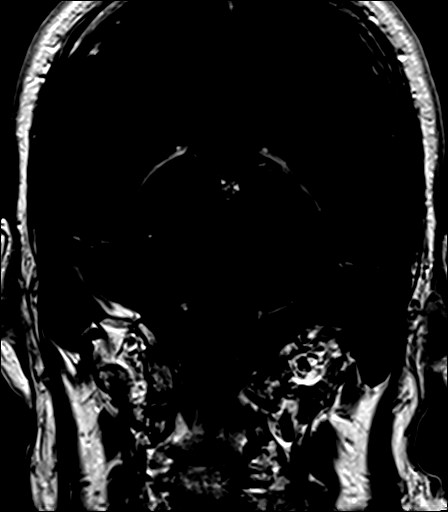

[Series 13: T1 post-contrast · axial · 3.0mm · 0.35mm/px · 1 of 11 slices shown (2 of 2)]
[im 1/11]
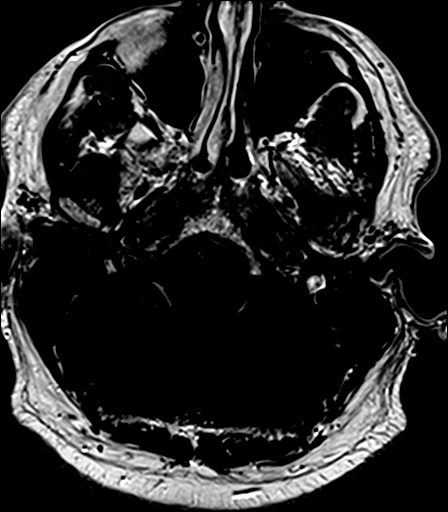

[37 of 48 positions shown; findings below may reference images not displayed]

FINDINGS: Brain: There is no cerebellopontine angle mass. Inner ear structures
demonstrate an unremarkable MR appearance. There is no abnormal
enhancement within the internal auditory canals.

No acute infarction or intracranial hemorrhage. There is no mass
effect or edema. Ventricles and sulci are normal in size and
configuration. There is no extra-axial fluid collection. No abnormal
enhancement.

Vascular: Major vessel flow voids at the skull base are preserved.

Skull and upper cervical spine: Normal marrow signal is preserved.

Sinuses/Orbits: Paranasal sinuses are clear. The orbits are
unremarkable.

Other: The sella is unremarkable.  Mastoid air cells are clear.
IMPRESSION: No mass or abnormal enhancement.

## 2023-04-23 ENCOUNTER — Encounter: Payer: Self-pay | Admitting: Family Medicine

## 2023-04-23 ENCOUNTER — Ambulatory Visit (INDEPENDENT_AMBULATORY_CARE_PROVIDER_SITE_OTHER): Payer: 59 | Admitting: Family Medicine

## 2023-04-23 ENCOUNTER — Other Ambulatory Visit: Payer: Self-pay

## 2023-04-23 VITALS — BP 122/84 | Ht 69.0 in | Wt 195.0 lb

## 2023-04-23 DIAGNOSIS — M75101 Unspecified rotator cuff tear or rupture of right shoulder, not specified as traumatic: Secondary | ICD-10-CM | POA: Insufficient documentation

## 2023-04-23 DIAGNOSIS — M67921 Unspecified disorder of synovium and tendon, right upper arm: Secondary | ICD-10-CM | POA: Diagnosis not present

## 2023-04-23 DIAGNOSIS — S46811D Strain of other muscles, fascia and tendons at shoulder and upper arm level, right arm, subsequent encounter: Secondary | ICD-10-CM

## 2023-04-23 DIAGNOSIS — S46811A Strain of other muscles, fascia and tendons at shoulder and upper arm level, right arm, initial encounter: Secondary | ICD-10-CM | POA: Insufficient documentation

## 2023-04-23 MED ORDER — NITROGLYCERIN 0.2 MG/HR TD PT24: MEDICATED_PATCH | TRANSDERMAL | 1 refills | Status: AC

## 2023-04-23 NOTE — Progress Notes (Signed)
 DATE OF VISIT: 04/23/2023        Troy Parsons DOB: 1960-05-03 MRN: [id number]  CC:  f/u Rt shoulder  History of present Illness: Troy Parsons is a 63 y.o. male who presents for a follow-up visit for Rt shoulder pain Last seen by me 03/17/23 - underwent subacromial CSI at that time - recommended ongoing Ibuprofen 800mg  tid prn and topical lidocaine prn - given HEP  Since last visit he reports he is improving, but still with pain in certain positions Feels about 70% improved, still pain with abduction and overhead activities Has returned to working out lifting weights -No pain with activity, but does note pain after doing push-ups Has been using ibuprofen 800 mg daily as needed Has been doing home exercise program regularly Denies any new injury or trauma  Denies any history of migraine headaches Denies any history of adhesive allergy Does use Viagra intermittently, approximately once every 2 weeks for erectile dysfunction  Medications:  Outpatient Encounter Medications as of 04/23/2023  Medication Sig   nitroGLYCERIN (NITRODUR - DOSED IN MG/24 HR) 0.2 mg/hr patch Use 1/4 patch daily to the affected area.   allopurinol (ZYLOPRIM) 300 MG tablet TAKE 1 TABLET(300 MG) BY MOUTH DAILY   ALPRAZolam (XANAX) 0.5 MG tablet Take 1 tablet (0.5 mg total) by mouth 2 (two) times daily as needed.   anastrozole (ARIMIDEX) 1 MG tablet Take 1 tablet by mouth once a week.   Ascorbic Acid (VITAMIN C) 500 MG CAPS Take 1 capsule by mouth daily at 6 (six) AM.   aspirin EC 81 MG tablet Take 1 tablet (81 mg total) by mouth daily. Swallow whole.   carbamazepine (TEGRETOL-XR) 200 MG 12 hr tablet Take 1 tablet (200 mg total) by mouth 2 (two) times daily.   Cholecalciferol (VITAMIN D) 50 MCG (2000 UT) tablet Take 2,000 Units by mouth daily.   Continuous Blood Gluc Receiver (FREESTYLE LIBRE 14 DAY READER) DEVI Apply 1 Device topically 4 (four) times daily as needed. E11.9   Continuous Glucose Sensor (FREESTYLE  LIBRE 3 PLUS SENSOR) MISC PLACE 1 SENSOR ON THE SKIN EVERY 14 DAYS   ibuprofen (ADVIL) 800 MG tablet Take 1 tablet (800 mg total) by mouth 2 (two) times daily as needed.   irbesartan (AVAPRO) 300 MG tablet Take 1 tablet (300 mg total) by mouth daily.   levothyroxine (SYNTHROID) 25 MCG tablet TAKE 1 TABLET BY MOUTH EVERY DAY   mefloquine (LARIAM) 250 MG tablet Take 1 tablet (250 mg total) by mouth every 7 (seven) days. Start 1 week prior to travel   metFORMIN (GLUCOPHAGE-XR) 500 MG 24 hr tablet TAKE 4 TABLETS BY MOUTH IN THE MORNING   omeprazole (PRILOSEC) 20 MG capsule TAKE 1 CAPSULE(20 MG) BY MOUTH TWICE DAILY   Semaglutide, 2 MG/DOSE, 8 MG/3ML SOPN Inject 2 mg as directed once a week.   sildenafil (VIAGRA) 100 MG tablet TakeTAKE 1/2 TO 1 TABLET(50 TO 100 MG) BY MOUTH DAILY AS NEEDED FOR ERECTILE DYSFUNCTION   simvastatin (ZOCOR) 40 MG tablet TAKE 1 TABLET(40 MG) BY MOUTH AT BEDTIME   tadalafil (CIALIS) 20 MG tablet Take 1 tablet (20 mg total) by mouth daily as needed for erectile dysfunction.   terbinafine (LAMISIL) 250 MG tablet Take 1 tablet (250 mg total) by mouth daily.   Testosterone Cypionate 200 MG/ML SOLN Inject 1 Dose as directed once a week.   valACYclovir (VALTREX) 1000 MG tablet Take 1,000 mg by mouth 2 (two) times daily.   Zinc 100 MG  TABS Take 1 tablet by mouth daily at 6 (six) AM.   zolpidem (AMBIEN) 10 MG tablet TAKE 1 TABLET(10 MG) BY MOUTH AT BEDTIME AS NEEDED FOR SLEEP   No facility-administered encounter medications on file as of 04/23/2023.    Allergies: is allergic to lipitor [atorvastatin].  Physical Examination: Vitals: BP 122/84   Ht 5\' 9"  (1.753 m)   Wt 195 lb (88.5 kg)   BMI 28.80 kg/m  GENERAL:  Troy Parsons is a 63 y.o. male appearing their stated age, alert and oriented x 3, in no apparent distress.  SKIN: no rashes or lesions, skin clean, dry, intact MSK:  Shoulder: Right shoulder without any gross deformity.  Full range of motion with positive  painful arc.  Tender palpation over the bicipital groove and greater tuberosity.  No tenderness over the [facility] joint.  Mildly positive empty can, mildly positive Neer, negative Hawkins.  Rotator cuff strength 5 -/5 throughout.  Normal grip strength. Left shoulder full range of motion without pain or weakness Neurovascular intact distally  Radiology: MSK ultrasound right shoulder Date: 04/23/2023 Indication: Right shoulder pain Findings: -Bicep tendon noted in the bicipital groove without subluxation.  He does have hypoechoic change within the tendon, also with some associated partial tearing.  No full-thickness tearing is appreciated.  Well-visualized in long and short axis views.  Small amount of fluid surrounding distal portion of biceps tendon. -Normal-appearing pectoralis major attachment to the proximal humerus -Subscapularis with hypoechoic change.  Does have articular sided partial tear at the insertional footplate.  Some surrounding cortical irregularity is noted.  Well-visualized in short and long axis views -No dynamic internal impingement on the coracoid is noted -AC joint with mild degenerative changes.  Positive geyser sign -Subacromial space with small amount of increased fluid in the subacromial bursa.  Mild amount of dynamic subacromial impingement is noted -Supraspinatus with hypoechoic change.  Does have some intrasubstance tearing and small articular sided partial tear.  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 Biceps tendinopathy, right 2. Partial tear of right subscapularis tendon, subsequent encounter 3. Tear of right supraspinatus tendon  Ongoing right shoulder pain which is approximately 70% improved status post subacromial injection 6 weeks ago.  MSK ultrasound today showing biceps tendinopathy with partial long head of the biceps tear, partial subscapularis tear, partial tear of the supraspinatus  Plan: 1.  MSK ultrasound completed in the office today with findings as noted above and reviewed with patient 2.  Diagnosis and treatment options reviewed.  He would be excellent candidate for topical nitroglycerin therapy to help with his healing.  He has no history of migraine headaches or other headaches, no history of adhesive allergy.  He does take intermittent Viagra for erectile dysfunction, but does not take regular nitrates. -Rx topical nitroglycerin apply quarter patch to the right shoulder daily, leave in place for 24 hours.  Explained how to take medication and common side effects.  Did advise about potential risk of headache.  If he does experience headache can take Tylenol or ibuprofen as needed.  If any severe headache should remove  the patch.  Did advise that if he does plan to be sexually active that he should remove the patch for that day.  Did caution with potential risk of dizziness or hypotension if using patch and Viagra at the same time.  Expressed understanding and agreement. 3.  He will continue with home exercise program as he is doing.  Advised to rest from push-ups and other lifting activities over the next 2 to 4 weeks, then gradually advance as tolerated 4.  He will follow-up with me in 6 weeks for reevaluation, we will plan repeat ultrasound at that time to assess his degree of healing.  He can follow-up sooner as needed.  Patient encouraged to call with any questions or concerns. 5. Patient expressed understanding & agreement  with above.  Encounter Diagnoses  Name Primary?   Biceps tendinopathy, right Yes   Partial tear of right subscapularis tendon, subsequent encounter    Tear of right supraspinatus tendon     Orders Placed This Encounter  Procedures   [place] LIMITED JOINT SPACE STRUCTURES UP RIGHT

## 2023-04-23 NOTE — Patient Instructions (Signed)

## 2023-05-06 ENCOUNTER — Ambulatory Visit (INDEPENDENT_AMBULATORY_CARE_PROVIDER_SITE_OTHER): Admitting: Diagnostic Neuroimaging

## 2023-05-06 ENCOUNTER — Encounter: Payer: Self-pay | Admitting: Diagnostic Neuroimaging

## 2023-05-06 VITALS — BP 150/86 | HR 66 | Ht 69.0 in | Wt 204.0 lb

## 2023-05-06 DIAGNOSIS — R519 Headache, unspecified: Secondary | ICD-10-CM | POA: Diagnosis not present

## 2023-05-06 NOTE — Progress Notes (Signed)
 [facility]  PATIENT: Troy Parsons DOB: 01/19/1961  REFERRING CLINICIAN: Corwin Levins, MD HISTORY FROM: patient  REASON FOR VISIT: new consult   HISTORICAL  CHIEF COMPLAINT:  Chief Complaint  Patient presents with   Facial Pain    Rm 7 alone  Pt is well, reports he has been having radiating L sided facial pain since Dec 2024. Got placed on Tegretol about 2 weeks which has helped. He also reports a moving sensation in his scalp/behind the ear.     HISTORY OF PRESENT ILLNESS:   64 year old male here for evaluation of left facial pain.  Symptoms started in December 2024.  Initially this was mild left maxillary pulsing and dull achy pain.  This progressed to left lower facial pain and sensation.  He went to dentist for evaluation but no specific dental pathology was found.  Possibility of trigeminal neuralgia was raised.  Patient was started on carbamazepine 200 mg twice a day.  This helped reduce symptoms.   REVIEW OF SYSTEMS: Full 14 system review of systems performed and negative with exception of: as per HPI.  ALLERGIES: Allergies  Allergen Reactions   Lipitor [Atorvastatin] Other (See Comments)    Increased liver enxymes    HOME MEDICATIONS: Outpatient Medications Prior to Visit  Medication Sig Dispense Refill   allopurinol (ZYLOPRIM) 300 MG tablet TAKE 1 TABLET(300 MG) BY MOUTH DAILY 90 tablet 3   ALPRAZolam (XANAX) 0.5 MG tablet Take 1 tablet (0.5 mg total) by mouth 2 (two) times daily as needed. 60 tablet 0   anastrozole (ARIMIDEX) 1 MG tablet Take 1 tablet by mouth once a week.     Ascorbic Acid (VITAMIN C) 500 MG CAPS Take 1 capsule by mouth daily at 6 (six) AM.     aspirin EC 81 MG tablet Take 1 tablet (81 mg total) by mouth daily. Swallow whole. 30 tablet 12   carbamazepine (TEGRETOL-XR) 200 MG 12 hr tablet Take 1 tablet (200 mg total) by mouth 2 (two) times daily. 60 tablet 5   Cholecalciferol (VITAMIN D) 50 MCG (2000 UT) tablet Take  2,000 Units by mouth daily.     Continuous Blood Gluc Receiver (FREESTYLE LIBRE 14 DAY READER) DEVI Apply 1 Device topically 4 (four) times daily as needed. E11.9 1 Device 0   Continuous Glucose Sensor (FREESTYLE LIBRE 3 PLUS SENSOR) MISC PLACE 1 SENSOR ON THE SKIN EVERY 14 DAYS 6 each 3   ibuprofen (ADVIL) 800 MG tablet Take 1 tablet (800 mg total) by mouth 2 (two) times daily as needed. 60 tablet 2   irbesartan (AVAPRO) 300 MG tablet Take 1 tablet (300 mg total) by mouth daily. 90 tablet 3   levothyroxine (SYNTHROID) 25 MCG tablet TAKE 1 TABLET BY MOUTH EVERY DAY 90 tablet 3   mefloquine (LARIAM) 250 MG tablet Take 1 tablet (250 mg total) by mouth every 7 (seven) days. Start 1 week prior to travel 6 tablet 0   metFORMIN (GLUCOPHAGE-XR) 500 MG 24 hr tablet TAKE 4 TABLETS BY MOUTH IN THE MORNING (Patient taking differently: TAKE 2 TABLETS BY MOUTH IN THE MORNING) 360 tablet 3   nitroGLYCERIN (NITRODUR - DOSED IN MG/24 HR) 0.2 mg/hr patch Use 1/4 patch daily to the affected area. 30 patch 1   omeprazole (PRILOSEC) 20 MG capsule TAKE 1 CAPSULE(20 MG) BY MOUTH TWICE DAILY 180 capsule 3   Semaglutide, 2 MG/DOSE, 8 MG/3ML SOPN Inject 2 mg as directed once a week. 9 mL 3   simvastatin (  ZOCOR) 40 MG tablet TAKE 1 TABLET(40 MG) BY MOUTH AT BEDTIME 90 tablet 3   tadalafil (CIALIS) 20 MG tablet Take 1 tablet (20 mg total) by mouth daily as needed for erectile dysfunction. 10 tablet 11   terbinafine (LAMISIL) 250 MG tablet Take 1 tablet (250 mg total) by mouth daily. 90 tablet 0   Testosterone Cypionate 200 MG/ML SOLN Inject 1 Dose as directed once a week.     valACYclovir (VALTREX) 1000 MG tablet Take 1,000 mg by mouth 2 (two) times daily.     Zinc 100 MG TABS Take 1 tablet by mouth daily at 6 (six) AM.     zolpidem (AMBIEN) 10 MG tablet TAKE 1 TABLET(10 MG) BY MOUTH AT BEDTIME AS NEEDED FOR SLEEP 90 tablet 1   sildenafil (VIAGRA) 100 MG tablet TakeTAKE 1/2 TO 1 TABLET(50 TO 100 MG) BY MOUTH DAILY AS NEEDED  FOR ERECTILE DYSFUNCTION (Patient not taking: Reported on 05/06/2023) 5 tablet 11   No facility-administered medications prior to visit.    PAST MEDICAL HISTORY: Past Medical History:  Diagnosis Date   Abnormal liver function    Depression    on meds   Esophageal reflux    on meds   Fatty liver    Gout    Hemorrhoids    Hiatal hernia    Hyperlipemia    on meds   Hypertension    on meds   Seasonal allergies    Type II or unspecified type diabetes mellitus without mention of complication, not stated as uncontrolled    on meds    PAST SURGICAL HISTORY: Past Surgical History:  Procedure Laterality Date   APPENDECTOMY     NASAL SEPTUM SURGERY  2010   UMBILICAL HERNIA REPAIR  2009    FAMILY HISTORY: Family History  Problem Relation Age of Onset   Diabetes Mother        most members of Maternal   Diabetes Paternal Grandfather    Colon cancer Other        neg. hx.   Heart disease Other    Hyperlipidemia Other    Hypertension Other    Stroke Other    Cancer Other    Esophageal cancer Neg Hx    Stomach cancer Neg Hx    Rectal cancer Neg Hx     SOCIAL HISTORY: Social History   Socioeconomic History   Marital status: Married    Spouse name: Not on file   Number of children: Not on file   Years of education: Not on file   Highest education level: Bachelor's degree (e.g., BA, AB, BS)  Occupational History   Occupation: rep for speakers bureau's    Comment: [employer]  Tobacco Use   Smoking status: Never   Smokeless tobacco: Never   Tobacco comments:    Domestic Patent examiner   Vaping status: Never Used  Substance and Sexual Activity   Alcohol use: Yes    Alcohol/week: 3.0 - 4.0 standard drinks of alcohol    Types: 3 - 4 Standard drinks or equivalent per week   Drug use: No   Sexual activity: Not on file  Other Topics Concern   Not on file  Social History Narrative   Daily caffeine   Social Drivers of Health   Financial Resource  Strain: Low Risk  (04/06/2023)   Overall Financial Resource Strain (CARDIA)    Difficulty of Paying Living Expenses: Not hard at all  Food Insecurity: No Food Insecurity (04/06/2023)  Hunger Vital Sign    Worried About Running Out of Food in the Last Year: Never true    Ran Out of Food in the Last Year: Never true  Transportation Needs: No Transportation Needs (04/06/2023)   PRAPARE - Administrator, Civil Service (Medical): No    Lack of Transportation (Non-Medical): No  Physical Activity: Sufficiently Active (04/06/2023)   Exercise Vital Sign    Days of Exercise per Week: 5 days    Minutes of Exercise per Session: 60 min  Stress: No Stress Concern Present (04/06/2023)   [employer] of Occupational Health - Occupational Stress Questionnaire    Feeling of Stress : Only a little  Social Connections: Moderately Integrated (04/06/2023)   Social Connection and Isolation Panel [NHANES]    Frequency of Communication with Friends and Family: More than three times a week    Frequency of Social Gatherings with Friends and Family: Three times a week    Attends Religious Services: 1 to 4 times per year    Active Member of Clubs or Organizations: No    Attends Engineer, structural: Not on file    Marital Status: Married  Catering manager Violence: Not on file     PHYSICAL EXAM  GENERAL EXAM/CONSTITUTIONAL: Vitals:  Vitals:   05/06/23 0853  BP: (!) 150/86  Pulse: 66  Weight: 204 lb (92.5 kg)  Height: 5\' 9"  (1.753 m)   Body mass index is 30.13 kg/m. Wt Readings from Last 3 Encounters:  05/06/23 204 lb (92.5 kg)  04/23/23 195 lb (88.5 kg)  04/10/23 197 lb (89.4 kg)   Patient is in no distress; well developed, nourished and groomed; neck is supple  CARDIOVASCULAR: Examination of carotid arteries is normal; no carotid bruits Regular rate and rhythm, no murmurs Examination of peripheral vascular system by observation and palpation is  normal  EYES: Ophthalmoscopic exam of optic discs and posterior segments is normal; no papilledema or hemorrhages No results found.  MUSCULOSKELETAL: Gait, strength, tone, movements noted in Neurologic exam below  NEUROLOGIC: MENTAL STATUS:      No data to display         awake, alert, oriented to person, place and time recent and remote memory intact normal attention and concentration language fluent, comprehension intact, naming intact fund of knowledge appropriate  CRANIAL NERVE:  2nd - no papilledema on fundoscopic exam 2nd, 3rd, 4th, 6th - pupils equal and reactive to light, visual fields full to confrontation, extraocular muscles intact, no nystagmus 5th - facial sensation symmetric 7th - facial strength symmetric 8th - hearing intact 9th - palate elevates symmetrically, uvula midline 11th - shoulder shrug symmetric 12th - tongue protrusion midline  MOTOR:  normal bulk and tone, full strength in the BUE, BLE  SENSORY:  normal and symmetric to light touch, temperature, vibration  COORDINATION:  finger-nose-finger, fine finger movements normal  REFLEXES:  deep tendon reflexes present and symmetric  GAIT/STATION:  narrow based gait     DIAGNOSTIC DATA (LABS, IMAGING, TESTING) - I reviewed patient records, labs, notes, testing and imaging myself where available.  Lab Results  Component Value Date   WBC 5.2 06/09/2022   HGB 15.2 06/09/2022   HCT 44.7 06/09/2022   MCV 91.6 06/09/2022   PLT 222.0 06/09/2022      Component Value Date/Time   NA 136 11/20/2022 0925   K 5.0 11/20/2022 0925   CL 100 11/20/2022 0925   CO2 27 11/20/2022 0925   GLUCOSE 102 (H) 11/20/2022  0925   BUN 20 11/20/2022 0925   CREATININE 0.91 11/20/2022 0925   CALCIUM 10.5 11/20/2022 0925   PROT 6.9 11/20/2022 0925   ALBUMIN 4.9 11/20/2022 0925   AST 32 11/20/2022 0925   ALT 36 11/20/2022 0925   ALKPHOS 37 (L) 11/20/2022 0925   BILITOT 0.6 11/20/2022 0925   GFRNONAA 110.42  12/27/2008 0818   GFRAA 134 12/08/2007 0000   Lab Results  Component Value Date   CHOL 131 11/20/2022   HDL 41.50 11/20/2022   LDLCALC 45 11/20/2022   LDLDIRECT 57.0 11/23/2019   TRIG 221.0 (H) 11/20/2022   CHOLHDL 3 11/20/2022   Lab Results  Component Value Date   HGBA1C 6.2 11/20/2022   Lab Results  Component Value Date   VITAMINB12 1,148 (H) 06/09/2022   Lab Results  Component Value Date   TSH 2.95 11/20/2022    1025/22 MRI brain [I reviewed images myself and agree with interpretation. -VRP]  - normal    ASSESSMENT AND PLAN  63 y.o. year old male here with:  Dx:  1. Left facial pain     PLAN:  ATYPICAL LEFT FACIAL PAIN (could be variant of trigeminal neuralgia) - check MRI brain / CN5 w/wo (rule out vascular, inflamm, mass) - continue carbamazepine 200 mg twice a day; may reduce to 200 mg daily for 1 to 2 weeks then stop to see if still necessary  Orders Placed This Encounter  Procedures   MR BRAIN W WO CONTRAST   MR FACE/TRIGEMINAL WO/W CM   Return for pending test results, pending if symptoms worsen or fail to improve.    Suanne Marker, MD 05/06/2023, 9:36 AM Certified in Neurology, Neurophysiology and Neuroimaging  [facility] [address], [address] [place], [place] [address] [phone]

## 2023-05-06 NOTE — Patient Instructions (Signed)
  ATYPICAL LEFT FACIAL PAIN  - MRI brain / CN5 w/wo (rule out vascular, inflamm, mass) - continue carbamazepine 200 mg twice a day; may reduce to 200 mg daily for 1 to 2 weeks then stop to see if still necessary

## 2023-05-15 ENCOUNTER — Other Ambulatory Visit

## 2023-05-15 DIAGNOSIS — Z125 Encounter for screening for malignant neoplasm of prostate: Secondary | ICD-10-CM | POA: Diagnosis not present

## 2023-05-15 DIAGNOSIS — E559 Vitamin D deficiency, unspecified: Secondary | ICD-10-CM | POA: Diagnosis not present

## 2023-05-15 DIAGNOSIS — R7989 Other specified abnormal findings of blood chemistry: Secondary | ICD-10-CM | POA: Diagnosis not present

## 2023-05-15 DIAGNOSIS — E1165 Type 2 diabetes mellitus with hyperglycemia: Secondary | ICD-10-CM

## 2023-05-15 LAB — LIPID PANEL
Cholesterol: 144 mg/dL (ref 0–200)
HDL: 38.6 mg/dL — ABNORMAL LOW (ref >39.00)
LDL Cholesterol: 73 mg/dL (ref 0–99)
NonHDL: 105.22
Total CHOL/HDL Ratio: 4
Triglycerides: 160 mg/dL — ABNORMAL HIGH (ref 0.0–149.0)
VLDL: 32 mg/dL (ref 0.0–40.0)

## 2023-05-15 LAB — CBC WITH DIFFERENTIAL/PLATELET
Basophils Absolute: 0 10*3/uL (ref 0.0–0.1)
Basophils Relative: 0.5 % (ref 0.0–3.0)
Eosinophils Absolute: 0.2 10*3/uL (ref 0.0–0.7)
Eosinophils Relative: 2.1 % (ref 0.0–5.0)
HCT: 48.9 % (ref 39.0–52.0)
Hemoglobin: 16.4 g/dL (ref 13.0–17.0)
Lymphocytes Relative: 23.5 % (ref 12.0–46.0)
Lymphs Abs: 1.7 10*3/uL (ref 0.7–4.0)
MCHC: 33.5 g/dL (ref 30.0–36.0)
MCV: 92.4 fl (ref 78.0–100.0)
Monocytes Absolute: 0.7 10*3/uL (ref 0.1–1.0)
Monocytes Relative: 9.2 % (ref 3.0–12.0)
Neutro Abs: 4.8 10*3/uL (ref 1.4–7.7)
Neutrophils Relative %: 64.7 % (ref 43.0–77.0)
Platelets: 214 10*3/uL (ref 150.0–400.0)
RBC: 5.29 Mil/uL (ref 4.22–5.81)
RDW: 14.2 % (ref 11.5–15.5)
WBC: 7.4 10*3/uL (ref 4.0–10.5)

## 2023-05-15 LAB — URINALYSIS, ROUTINE W REFLEX MICROSCOPIC
Bilirubin Urine: NEGATIVE
Hgb urine dipstick: NEGATIVE
Ketones, ur: NEGATIVE
Leukocytes,Ua: NEGATIVE
Nitrite: NEGATIVE
RBC / HPF: NONE SEEN (ref 0–?)
Specific Gravity, Urine: 1.01 (ref 1.000–1.030)
Total Protein, Urine: NEGATIVE
Urine Glucose: NEGATIVE
Urobilinogen, UA: 0.2 (ref 0.0–1.0)
pH: 6.5 (ref 5.0–8.0)

## 2023-05-15 LAB — HEPATIC FUNCTION PANEL
ALT: 23 U/L (ref 0–53)
AST: 21 U/L (ref 0–37)
Albumin: 4.8 g/dL (ref 3.5–5.2)
Alkaline Phosphatase: 42 U/L (ref 39–117)
Bilirubin, Direct: 0.1 mg/dL (ref 0.0–0.3)
Total Bilirubin: 0.4 mg/dL (ref 0.2–1.2)
Total Protein: 6.8 g/dL (ref 6.0–8.3)

## 2023-05-15 LAB — VITAMIN D 25 HYDROXY (VIT D DEFICIENCY, FRACTURES): VITD: 52.3 ng/mL (ref 30.00–100.00)

## 2023-05-15 LAB — BASIC METABOLIC PANEL WITH GFR
BUN: 18 mg/dL (ref 6–23)
CO2: 29 meq/L (ref 19–32)
Calcium: 9.7 mg/dL (ref 8.4–10.5)
Chloride: 98 meq/L (ref 96–112)
Creatinine, Ser: 0.88 mg/dL (ref 0.40–1.50)
GFR: 93.29 mL/min (ref >60.00)
Glucose, Bld: 99 mg/dL (ref 70–99)
Potassium: 4.5 meq/L (ref 3.5–5.1)
Sodium: 137 meq/L (ref 135–145)

## 2023-05-15 LAB — HEMOGLOBIN A1C: Hgb A1c MFr Bld: 6.3 % (ref 4.6–6.5)

## 2023-05-15 LAB — VITAMIN B12: Vitamin B-12: 1081 pg/mL — ABNORMAL HIGH (ref 211–911)

## 2023-05-15 LAB — MICROALBUMIN / CREATININE URINE RATIO
Creatinine,U: 84.1 mg/dL
Microalb Creat Ratio: UNDETERMINED mg/g (ref 0.0–30.0)
Microalb, Ur: <0.7 mg/dL

## 2023-05-15 LAB — PSA: PSA: 1.2 ng/mL (ref 0.10–4.00)

## 2023-05-15 LAB — TSH: TSH: 3.38 u[IU]/mL (ref 0.35–5.50)

## 2023-05-19 ENCOUNTER — Ambulatory Visit

## 2023-05-19 DIAGNOSIS — R519 Headache, unspecified: Secondary | ICD-10-CM

## 2023-05-19 MED ORDER — GADOBENATE DIMEGLUMINE 529 MG/ML IV SOLN
20.0000 mL | Freq: Once | INTRAVENOUS | Status: AC | PRN
  Administered 2023-05-19: 20 mL via INTRAVENOUS

## 2023-05-21 ENCOUNTER — Ambulatory Visit (INDEPENDENT_AMBULATORY_CARE_PROVIDER_SITE_OTHER): Payer: 59 | Admitting: Internal Medicine

## 2023-05-21 ENCOUNTER — Encounter: Payer: Self-pay | Admitting: Internal Medicine

## 2023-05-21 VITALS — BP 122/80 | HR 64 | Temp 98.0°F | Ht 69.0 in | Wt 198.0 lb

## 2023-05-21 DIAGNOSIS — G4700 Insomnia, unspecified: Secondary | ICD-10-CM

## 2023-05-21 DIAGNOSIS — Z0001 Encounter for general adult medical examination with abnormal findings: Secondary | ICD-10-CM | POA: Diagnosis not present

## 2023-05-21 DIAGNOSIS — I10 Essential (primary) hypertension: Secondary | ICD-10-CM | POA: Diagnosis not present

## 2023-05-21 DIAGNOSIS — Z7985 Long-term (current) use of injectable non-insulin antidiabetic drugs: Secondary | ICD-10-CM | POA: Diagnosis not present

## 2023-05-21 DIAGNOSIS — R7989 Other specified abnormal findings of blood chemistry: Secondary | ICD-10-CM

## 2023-05-21 DIAGNOSIS — E1165 Type 2 diabetes mellitus with hyperglycemia: Secondary | ICD-10-CM | POA: Diagnosis not present

## 2023-05-21 DIAGNOSIS — N529 Male erectile dysfunction, unspecified: Secondary | ICD-10-CM

## 2023-05-21 DIAGNOSIS — E782 Mixed hyperlipidemia: Secondary | ICD-10-CM | POA: Diagnosis not present

## 2023-05-21 DIAGNOSIS — E039 Hypothyroidism, unspecified: Secondary | ICD-10-CM

## 2023-05-21 DIAGNOSIS — E559 Vitamin D deficiency, unspecified: Secondary | ICD-10-CM

## 2023-05-21 MED ORDER — ZOLPIDEM TARTRATE 10 MG PO TABS: ORAL_TABLET | ORAL | 1 refills | Status: DC

## 2023-05-21 MED ORDER — TIRZEPATIDE 2.5 MG/0.5ML ~~LOC~~ SOAJ: 2.5000 mg | SUBCUTANEOUS | 11 refills | Status: DC

## 2023-05-21 MED ORDER — SILDENAFIL CITRATE 100 MG PO TABS: 50.0000 mg | ORAL_TABLET | Freq: Every day | ORAL | 11 refills | Status: DC | PRN

## 2023-05-21 NOTE — Patient Instructions (Signed)
 Please take all new medication as prescribed - the mounjaro 2.5 mg - and call or mychart in 1 month for the next higher dose if you are doing well  Ok to change the cialis to viagra as needed  Please continue all other medications as before, and refills have been done if requested  Mahlon Gammon to wean off the tegeretol as you have planned  Please have the pharmacy call with any other refills you may need.  Please continue your efforts at being more active, low cholesterol diet, and weight control.  You are otherwise up to date with prevention measures today.  Please keep your appointments with your specialists as you may have planned  Please make an Appointment to return in 6 months, or sooner if needed, also with Lab Appointment for testing done 3-5 days before at the FIRST FLOOR Lab (so this is for TWO appointments - please see the scheduling desk as you leave)

## 2023-05-21 NOTE — Progress Notes (Signed)
 Patient ID: Troy Parsons, male   DOB: 05/15/1960, 63 y.o.   MRN: [id number]         Chief Complaint:: wellness exam and dm, hld, htn, low thyroid, low vit d and b12,, ED , insomnia, trigeminal neuralgia       HPI:  Troy Parsons is a 63 y.o. male here for wellness exam; declines pneumonia shot, o/w up to date                        Also Pt denies chest pain, increased sob or doe, wheezing, orthopnea, PND, increased LE swelling, palpitations, dizziness or syncope.   Pt denies polydipsia, polyuria, or new focal neuro s/s.    Pt denies fever, wt loss, night sweats, loss of appetite, or other constitutional symptoms   Unable to lose wt, ok for change ozempic.  Has essentially resolved trigeminal symptoms.  Has ongoing insomnia, needs ambien refill.  Cialis not working now for ED, asks for change.   Wt Readings from Last 3 Encounters:  05/21/23 198 lb (89.8 kg)  05/06/23 204 lb (92.5 kg)  04/23/23 195 lb (88.5 kg)   BP Readings from Last 3 Encounters:  05/21/23 122/80  05/06/23 (!) 150/86  04/23/23 122/84   Immunization History  Administered Date(s) Administered   Influenza Whole 11/22/2007, 12/18/2008   Influenza, Seasonal, Injecte, Preservative Fre 11/20/2022   Influenza,inj,Quad PF,6+ Mos 01/19/2013, 12/13/2017, 10/27/2018, 11/29/2019, 12/05/2020, 12/11/2021   Influenza-Unspecified 11/28/2015, 10/30/2016, 12/11/2017   MMR 06/16/2017   PFIZER(Purple Top)SARS-COV-2 Vaccination 04/06/2019, 05/04/2019, 12/29/2019   Pneumococcal Conjugate-13 03/30/2015   Pneumococcal Polysaccharide-23 04/16/2017   Td 12/29/2008   Tdap 06/07/2020   Zoster Recombinant(Shingrix) 08/27/2020, 11/19/2020   Health Maintenance Due  Topic Date Due   Pneumococcal Vaccine 56-5 Years old (3 of 3 - PCV20 or PCV21) 04/17/2022      Past Medical History:  Diagnosis Date   Abnormal liver function    Depression    on meds   Esophageal reflux    on meds   Fatty liver    Gout    Hemorrhoids    Hiatal  hernia    Hyperlipemia    on meds   Hypertension    on meds   Seasonal allergies    Type II or unspecified type diabetes mellitus without mention of complication, not stated as uncontrolled    on meds   Past Surgical History:  Procedure Laterality Date   APPENDECTOMY     NASAL SEPTUM SURGERY  2010   UMBILICAL HERNIA REPAIR  2009    reports that he has never smoked. He has never used smokeless tobacco. He reports current alcohol use of about 3.0 - 4.0 standard drinks of alcohol per week. He reports that he does not use drugs. family history includes Cancer in an other family member; Colon cancer in an other family member; Diabetes in his mother and paternal grandfather; Heart disease in an other family member; Hyperlipidemia in an other family member; Hypertension in an other family member; Stroke in an other family member. Allergies  Allergen Reactions   Lipitor [Atorvastatin] Other (See Comments)    Increased liver enxymes   Current Outpatient Medications on File Prior to Visit  Medication Sig Dispense Refill   allopurinol (ZYLOPRIM) 300 MG tablet TAKE 1 TABLET(300 MG) BY MOUTH DAILY 90 tablet 3   ALPRAZolam (XANAX) 0.5 MG tablet Take 1 tablet (0.5 mg total) by mouth 2 (two) times daily as needed. 60 tablet  0   anastrozole (ARIMIDEX) 1 MG tablet Take 1 tablet by mouth once a week.     Ascorbic Acid (VITAMIN C) 500 MG CAPS Take 1 capsule by mouth daily at 6 (six) AM.     aspirin EC 81 MG tablet Take 1 tablet (81 mg total) by mouth daily. Swallow whole. 30 tablet 12   Cholecalciferol (VITAMIN D) 50 MCG (2000 UT) tablet Take 2,000 Units by mouth daily.     Continuous Blood Gluc Receiver (FREESTYLE LIBRE 14 DAY READER) DEVI Apply 1 Device topically 4 (four) times daily as needed. E11.9 1 Device 0   Continuous Glucose Sensor (FREESTYLE LIBRE 3 PLUS SENSOR) MISC PLACE 1 SENSOR ON THE SKIN EVERY 14 DAYS 6 each 3   ibuprofen (ADVIL) 800 MG tablet Take 1 tablet (800 mg total) by mouth 2 (two)  times daily as needed. 60 tablet 2   irbesartan (AVAPRO) 300 MG tablet Take 1 tablet (300 mg total) by mouth daily. 90 tablet 3   levothyroxine (SYNTHROID) 25 MCG tablet TAKE 1 TABLET BY MOUTH EVERY DAY 90 tablet 3   mefloquine (LARIAM) 250 MG tablet Take 1 tablet (250 mg total) by mouth every 7 (seven) days. Start 1 week prior to travel 6 tablet 0   metFORMIN (GLUCOPHAGE-XR) 500 MG 24 hr tablet TAKE 4 TABLETS BY MOUTH IN THE MORNING (Patient taking differently: TAKE 2 TABLETS BY MOUTH IN THE MORNING) 360 tablet 3   nitroGLYCERIN (NITRODUR - DOSED IN MG/24 HR) 0.2 mg/hr patch Use 1/4 patch daily to the affected area. 30 patch 1   omeprazole (PRILOSEC) 20 MG capsule TAKE 1 CAPSULE(20 MG) BY MOUTH TWICE DAILY 180 capsule 3   simvastatin (ZOCOR) 40 MG tablet TAKE 1 TABLET(40 MG) BY MOUTH AT BEDTIME 90 tablet 3   terbinafine (LAMISIL) 250 MG tablet Take 1 tablet (250 mg total) by mouth daily. 90 tablet 0   Testosterone Cypionate 200 MG/ML SOLN Inject 1 Dose as directed once a week.     valACYclovir (VALTREX) 1000 MG tablet Take 1,000 mg by mouth 2 (two) times daily.     Zinc 100 MG TABS Take 1 tablet by mouth daily at 6 (six) AM.     No current facility-administered medications on file prior to visit.        ROS:  All others reviewed and negative.  Objective        PE:  BP 122/80 (BP Location: Right Arm, Patient Position: Sitting, Cuff Size: Normal)   Pulse 64   Temp 98 F (36.7 C) (Oral)   Ht 5\' 9"  (1.753 m)   Wt 198 lb (89.8 kg)   SpO2 98%   BMI 29.24 kg/m                 Constitutional: Pt appears in NAD               HENT: Head: NCAT.                Right Ear: External ear normal.                 Left Ear: External ear normal.                Eyes: . Pupils are equal, round, and reactive to light. Conjunctivae and EOM are normal               Nose: without d/c or deformity  Neck: Neck supple. Gross normal ROM               Cardiovascular: Normal rate and regular  rhythm.                 Pulmonary/Chest: Effort normal and breath sounds without rales or wheezing.                Abd:  Soft, NT, ND, + BS, no organomegaly               Neurological: Pt is alert. At baseline orientation, motor grossly intact               Skin: Skin is warm. No rashes, no other new lesions, LE edema - none               Psychiatric: Pt behavior is normal without agitation   Micro: none  Cardiac tracings I have personally interpreted today:  none  Pertinent Radiological findings (summarize): none   Lab Results  Component Value Date   WBC 7.4 05/15/2023   HGB 16.4 05/15/2023   HCT 48.9 05/15/2023   PLT 214.0 05/15/2023   GLUCOSE 99 05/15/2023   CHOL 144 05/15/2023   TRIG 160.0 (H) 05/15/2023   HDL 38.60 (L) 05/15/2023   LDLDIRECT 57.0 11/23/2019   LDLCALC 73 05/15/2023   ALT 23 05/15/2023   AST 21 05/15/2023   NA 137 05/15/2023   K 4.5 05/15/2023   CL 98 05/15/2023   CREATININE 0.88 05/15/2023   BUN 18 05/15/2023   CO2 29 05/15/2023   TSH 3.38 05/15/2023   PSA 1.20 05/15/2023   INR 1.0 03/29/2010   HGBA1C 6.3 05/15/2023   MICROALBUR <0.7 05/15/2023   Assessment/Plan:  Troy Parsons is a 63 y.o. White or Caucasian [1] male with  has a past medical history of Abnormal liver function, Depression, Esophageal reflux, Fatty liver, Gout, Hemorrhoids, Hiatal hernia, Hyperlipemia, Hypertension, Seasonal allergies, and Type II or unspecified type diabetes mellitus without mention of complication, not stated as uncontrolled.  Encounter for well adult exam with abnormal findings Age and sex appropriate education and counseling updated with regular exercise and diet Referrals for preventative services - none needed Immunizations addressed - declines pneumovax Smoking counseling  - none needed Evidence for depression or other mood disorder - none significant Most recent labs reviewed. I have personally reviewed and have noted: 1) the patient's medical and social  history 2) The patient's current medications and supplements 3) The patient's height, weight, and BMI have been recorded in the chart   Type 2 diabetes mellitus (HCC) Lab Results  Component Value Date   HGBA1C 6.3 05/15/2023   With uncontrolled obeisty, pt to continue current medical treatment metfomrin ER 500 mg - 4 every day, but change ozempic to mounjaro with intent to titrate monthly   Mixed hyperlipidemia Lab Results  Component Value Date   LDLCALC 73 05/15/2023   uncontrolled, pt to continue current statin  - zocor 40 mg every day and lower chol diet, declines other change fo r now   Insomnia  Chronic stable, to continue ambien at bedtime [rn  Hypertension BP Readings from Last 3 Encounters:  05/21/23 122/80  05/06/23 (!) 150/86  04/23/23 122/84   Stable, pt to continue medical treatment avapro 300 qd   Hypothyroidism Lab Results  Component Value Date   TSH 3.38 05/15/2023   Stable, pt to continue levothyroxine 25 mcg qd   Vitamin D deficiency Last vitamin D  Lab Results  Component Value Date   VD25OH 52.30 05/15/2023   Stable, cont oral replacement   Low vitamin B12 level Lab Results  Component Value Date   VITAMINB12 1,081 (H) 05/15/2023   Stable, cont oral replacement - b12 1000 mcg qd   Erectile dysfunction Ok for chanage cilais to viagra 100 mg prn,  Followup: Return in about 6 months (around 11/20/2023).  Rosalia Colonel, MD 05/23/2023 3:16 PM [facility]

## 2023-05-22 ENCOUNTER — Encounter: Payer: Self-pay | Admitting: Diagnostic Neuroimaging

## 2023-05-23 ENCOUNTER — Encounter: Payer: Self-pay | Admitting: Internal Medicine

## 2023-05-23 DIAGNOSIS — N529 Male erectile dysfunction, unspecified: Secondary | ICD-10-CM | POA: Insufficient documentation

## 2023-05-23 NOTE — Assessment & Plan Note (Signed)
 Ok for [employer] to viagra 100 mg prn,

## 2023-05-23 NOTE — Assessment & Plan Note (Signed)
 Lab Results  Component Value Date   LDLCALC 73 05/15/2023   uncontrolled, pt to continue current statin  - zocor 40 mg every day and lower chol diet, declines other change fo r now

## 2023-05-23 NOTE — Assessment & Plan Note (Signed)
Age and sex appropriate education and counseling updated with regular exercise and diet Referrals for preventative services - none needed Immunizations addressed - declines pneumovax Smoking counseling  - none needed Evidence for depression or other mood disorder - none significant Most recent labs reviewed. I have personally reviewed and have noted: 1) the patient's medical and social history 2) The patient's current medications and supplements 3) The patient's height, weight, and BMI have been recorded in the chart 

## 2023-05-23 NOTE — Assessment & Plan Note (Signed)
 Chronic stable, to continue ambien at bedtime [rn

## 2023-05-23 NOTE — Assessment & Plan Note (Signed)
 Lab Results  Component Value Date   HGBA1C 6.3 05/15/2023   With uncontrolled obeisty, pt to continue current medical treatment metfomrin ER 500 mg - 4 every day, but change ozempic to mounjaro with intent to titrate monthly

## 2023-05-23 NOTE — Assessment & Plan Note (Signed)
 BP Readings from Last 3 Encounters:  05/21/23 122/80  05/06/23 (!) 150/86  04/23/23 122/84   Stable, pt to continue medical treatment avapro 300 qd

## 2023-05-23 NOTE — Assessment & Plan Note (Signed)
 Last vitamin D Lab Results  Component Value Date   VD25OH 52.30 05/15/2023   Stable, cont oral replacement

## 2023-05-23 NOTE — Assessment & Plan Note (Signed)
 Lab Results  Component Value Date   TSH 3.38 05/15/2023   Stable, pt to continue levothyroxine 25 mcg qd

## 2023-05-23 NOTE — Assessment & Plan Note (Signed)
 Lab Results  Component Value Date   VITAMINB12 1,081 (H) 05/15/2023   Stable, cont oral replacement - b12 1000 mcg qd

## 2023-06-01 ENCOUNTER — Ambulatory Visit: Admitting: Family Medicine

## 2023-06-09 ENCOUNTER — Ambulatory Visit: Admitting: Family Medicine

## 2023-06-10 ENCOUNTER — Other Ambulatory Visit: Payer: Self-pay

## 2023-06-10 ENCOUNTER — Encounter: Payer: Self-pay | Admitting: Internal Medicine

## 2023-06-10 MED ORDER — SIMVASTATIN 40 MG PO TABS: ORAL_TABLET | ORAL | 3 refills | Status: AC

## 2023-06-10 MED ORDER — OMEPRAZOLE 20 MG PO CPDR: DELAYED_RELEASE_CAPSULE | ORAL | 3 refills | Status: AC

## 2023-06-12 ENCOUNTER — Ambulatory Visit (INDEPENDENT_AMBULATORY_CARE_PROVIDER_SITE_OTHER): Admitting: Sports Medicine

## 2023-06-12 VITALS — BP 132/84 | Ht 69.0 in | Wt 190.0 lb

## 2023-06-12 DIAGNOSIS — M75101 Unspecified rotator cuff tear or rupture of right shoulder, not specified as traumatic: Secondary | ICD-10-CM | POA: Diagnosis not present

## 2023-06-12 MED ORDER — METHYLPREDNISOLONE ACETATE 80 MG/ML IJ SUSP
80.0000 mg | Freq: Once | INTRAMUSCULAR | Status: AC
  Administered 2023-06-12: 80 mg via INTRA_ARTICULAR

## 2023-06-12 NOTE — Progress Notes (Signed)
 PCP: Roslyn Coombe, MD  SUBJECTIVE:   HPI:  Patient is a 63 y.o. male here for f/u on right shoulder pain.  He reports pain in the right shoulder over the past 4 to 6 weeks.  He had gotten roughly 6 weeks of benefit in pain relief from the subacromial injection he received back in February.  He has known right long head biceps tendinopathy with partial-thickness tearing as well as partial-thickness articular surface tearing of the subscapularis and partial-thickness articular and intrasubstance supraspinatus tearing noted on ultrasound back in March.  He was started on nitroglycerin  patches and has been doing home exercises 5 to 7 days a week since his last visit.  He states that he was unable to tolerate the nitroglycerin  patches due to increasing pain in his shoulder with use.  He is hoping to get a repeat injection today due to his discomfort.  He endorses some fresh in with his progress and is eager to return back to weightlifting.   Pertinent ROS were reviewed with the patient and found to be negative unless otherwise specified above in HPI.   PERTINENT  PMH / PSH / FH / SH:  Past Medical, Surgical, Social, and Family History Reviewed & Updated in the EMR.  Pertinent findings include:  Hypertension Type 2 diabetes, well-controlled most recent A1c was 6.34 weeks ago on review of recent labs.  Allergies  Allergen Reactions   Lipitor [Atorvastatin ] Other (See Comments)    Increased liver enxymes   OBJECTIVE:  BP 132/84   Ht 5\' 9"  (1.753 m)   Wt 190 lb (86.2 kg)   BMI 28.06 kg/m   PHYSICAL EXAM:  GEN: Alert and Oriented, NAD, comfortable in exam room RESP: Unlabored respirations, symmetric chest rise PSY: normal mood, congruent affect   RIGHT SHOULDER MSK EXAM: No swelling, ecchymoses.  No gross deformity. TTP over posterolateral shoulder, biceps groove, and mildly over AC joint. FROM. Strength 4+/5 on empty can testing (+pain), 5/5 IR and ER, 5/5 elbow flex/ext +pain with  ER and IR + Hawkins, negative Neers. +Speeds Negative Yergasons. 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 He has not appreciated much interval improvement over the past 3 months with HEP and was unable to tolerate PT. Not presently interested in surgical intervention and would like to continue conservative approach. Requesting repeat cortisone injection.  Plan: -Subacromial injection performed as below. After care measures reviewed. Recommend monitoring of blood sugar following injection of corticosteroid in light of his well-controlled, T2DM. -Discontinue NG patches due to intolerance. -Modifications to HEP provided to also include some biceps tendon rehab -Continue activity as tolerated with 5# weight restriction on right shoulder -F/u in 4-6 weeks to check in. If failing to get long lasting benefit consider MRI and referral to Dr. Yvonne Hering for consideration of right subacromial decompression, biceps tenodesis, distal clavicle excision. Patient's questions were answered and they are in agreement with this plan.  Right Subacromial Injection Procedure: After informed written consent timeout was performed, patient was in seated position on exam table.  Right shoulder was prepped with alcohol swab x2. Ethyl chloride spray used for topical anesthetic. Utilizing the posterior approach and a 25g needle, the right subacromial bursa was injected with 4:1 lidocaine:depomedrol (80mg /mL).  Following the injection a bandage was applied to the area. Patient tolerated procedure well without immediate complications. The  patient was counseled as to the expected post-injection course, including the possibility of worsening of pain with steroid flare. Instructed as to concerning symptoms and advised to  contact the office if these should arise.   Lin Rend, MD PGY-4, Sports Medicine Fellow [facility]

## 2023-06-17 DIAGNOSIS — M25511 Pain in right shoulder: Secondary | ICD-10-CM | POA: Insufficient documentation

## 2023-07-11 ENCOUNTER — Encounter: Payer: Self-pay | Admitting: Internal Medicine

## 2023-07-13 ENCOUNTER — Other Ambulatory Visit: Payer: Self-pay

## 2023-07-13 MED ORDER — ALLOPURINOL 300 MG PO TABS: ORAL_TABLET | ORAL | 3 refills | Status: AC

## 2023-07-15 ENCOUNTER — Ambulatory Visit: Admitting: Sports Medicine

## 2023-09-02 ENCOUNTER — Encounter: Payer: Self-pay | Admitting: Internal Medicine

## 2023-09-02 MED ORDER — ALPRAZOLAM 0.5 MG PO TABS: 0.5000 mg | ORAL_TABLET | Freq: Two times a day (BID) | ORAL | 0 refills | Status: AC | PRN

## 2023-09-02 NOTE — Telephone Encounter (Signed)
 Ok this is done

## 2023-09-22 ENCOUNTER — Other Ambulatory Visit: Payer: Self-pay | Admitting: Internal Medicine

## 2023-09-23 ENCOUNTER — Other Ambulatory Visit (HOSPITAL_COMMUNITY): Payer: Self-pay

## 2023-09-26 ENCOUNTER — Encounter: Payer: Self-pay | Admitting: Internal Medicine

## 2023-09-28 ENCOUNTER — Telehealth: Payer: Self-pay

## 2023-09-28 ENCOUNTER — Other Ambulatory Visit (HOSPITAL_COMMUNITY): Payer: Self-pay

## 2023-09-28 MED ORDER — TIRZEPATIDE 5 MG/0.5ML ~~LOC~~ SOAJ
5.0000 mg | SUBCUTANEOUS | 3 refills | Status: DC
Start: 2023-09-28 — End: 2023-11-11

## 2023-09-28 MED ORDER — IBUPROFEN 800 MG PO TABS: 800.0000 mg | ORAL_TABLET | Freq: Two times a day (BID) | ORAL | 2 refills | Status: DC | PRN

## 2023-09-28 NOTE — Telephone Encounter (Signed)
 Pharmacy Patient Advocate Encounter   Received notification from CoverMyMeds that prior authorization for Mounjaro  5MG /0.5ML auto-injectors  is required/requested.   Insurance verification completed.   The patient is insured through [employer] .   Per test claim: PA required; PA submitted to above mentioned insurance via Latent Key/confirmation #/EOC [id number] Status is pending

## 2023-09-29 ENCOUNTER — Other Ambulatory Visit (HOSPITAL_COMMUNITY): Payer: Self-pay

## 2023-09-29 NOTE — Telephone Encounter (Signed)
 Notified pt via [employer]

## 2023-09-29 NOTE — Telephone Encounter (Signed)
 Pharmacy Patient Advocate Encounter  Received notification from [facility] that Prior Authorization forMounjaro 5MG /0.5ML auto-injectors has been APPROVED from 09/28/2023 to 09/27/2024   PA #/Case ID/Reference #: [id number]

## 2023-10-01 ENCOUNTER — Other Ambulatory Visit (HOSPITAL_COMMUNITY): Payer: Self-pay

## 2023-10-15 ENCOUNTER — Other Ambulatory Visit: Payer: Self-pay | Admitting: Internal Medicine

## 2023-10-16 NOTE — Telephone Encounter (Signed)
 Please to contact pt  Does he really want to restart this, or is this simply a routine request from the pharmacy, thanks

## 2023-10-19 NOTE — Telephone Encounter (Signed)
 Attempted to call patient but had to leave a voice message for call back.

## 2023-10-20 ENCOUNTER — Telehealth: Payer: Self-pay | Admitting: Internal Medicine

## 2023-10-20 NOTE — Telephone Encounter (Signed)
 Copied from CRM [id number]. Topic: Clinical - Medication Refill >> Oct 20, 2023  3:40 PM Henretta I wrote: Medication: carbamazepine  (TEGRETOL  XR) 200 MG \ Has the patient contacted their pharmacy? Yes, pharmacy called on his behave.  (Agent: If no, request that the patient contact the pharmacy for the refill. If patient does not wish to contact the pharmacy document the reason why and proceed with request.) (Agent: If yes, when and what did the pharmacy advise?)  This is the patient's preferred pharmacy:  [id number] [address], [place] - [address] AT [facility] [address] [place] [address] Phone: [phone] Fax: [phone]  Is this the correct pharmacy for this prescription? Yes If no, delete pharmacy and type the correct one.   Has the prescription been filled recently? No  Is the patient out of the medication? Yes  Has the patient been seen for an appointment in the last year OR does the patient have an upcoming appointment? Yes  Can we respond through MyChart? Yes  Agent: Please be advised that Rx refills may take up to 3 business days. We ask that you follow-up with your pharmacy.

## 2023-10-20 NOTE — Telephone Encounter (Signed)
 Requesting medication not on profile, Tegretol  XR, 200mg 

## 2023-10-20 NOTE — Telephone Encounter (Signed)
 LM for pt to let us  know reason for refill as this was discontinued

## 2023-10-22 NOTE — Telephone Encounter (Signed)
 Pharmacy called back for f/u. I relayed that nurse is waiting for a cb from the pt.

## 2023-11-04 ENCOUNTER — Other Ambulatory Visit (INDEPENDENT_AMBULATORY_CARE_PROVIDER_SITE_OTHER)

## 2023-11-04 DIAGNOSIS — E559 Vitamin D deficiency, unspecified: Secondary | ICD-10-CM | POA: Diagnosis not present

## 2023-11-04 DIAGNOSIS — E1165 Type 2 diabetes mellitus with hyperglycemia: Secondary | ICD-10-CM | POA: Diagnosis not present

## 2023-11-04 DIAGNOSIS — R7989 Other specified abnormal findings of blood chemistry: Secondary | ICD-10-CM | POA: Diagnosis not present

## 2023-11-04 LAB — HEPATIC FUNCTION PANEL
ALT: 48 U/L (ref 0–53)
AST: 35 U/L (ref 0–37)
Albumin: 4.5 g/dL (ref 3.5–5.2)
Alkaline Phosphatase: 42 U/L (ref 39–117)
Bilirubin, Direct: 0.1 mg/dL (ref 0.0–0.3)
Total Bilirubin: 0.5 mg/dL (ref 0.2–1.2)
Total Protein: 6.7 g/dL (ref 6.0–8.3)

## 2023-11-04 LAB — BASIC METABOLIC PANEL WITH GFR
BUN: 13 mg/dL (ref 6–23)
CO2: 29 meq/L (ref 19–32)
Calcium: 9.5 mg/dL (ref 8.4–10.5)
Chloride: 101 meq/L (ref 96–112)
Creatinine, Ser: 0.94 mg/dL (ref 0.40–1.50)
GFR: 87.65 mL/min (ref >60.00)
Glucose, Bld: 116 mg/dL — ABNORMAL HIGH (ref 70–99)
Potassium: 3.9 meq/L (ref 3.5–5.1)
Sodium: 138 meq/L (ref 135–145)

## 2023-11-04 LAB — LIPID PANEL
Cholesterol: 132 mg/dL (ref 0–200)
HDL: 43.2 mg/dL (ref >39.00)
LDL Cholesterol: 51 mg/dL (ref 0–99)
NonHDL: 88.32
Total CHOL/HDL Ratio: 3
Triglycerides: 186 mg/dL — ABNORMAL HIGH (ref 0.0–149.0)
VLDL: 37.2 mg/dL (ref 0.0–40.0)

## 2023-11-04 LAB — VITAMIN D 25 HYDROXY (VIT D DEFICIENCY, FRACTURES): VITD: 41.04 ng/mL (ref 30.00–100.00)

## 2023-11-04 LAB — HEMOGLOBIN A1C: Hgb A1c MFr Bld: 6.7 % — ABNORMAL HIGH (ref 4.6–6.5)

## 2023-11-04 LAB — VITAMIN B12: Vitamin B-12: 1054 pg/mL — ABNORMAL HIGH (ref 211–911)

## 2023-11-11 ENCOUNTER — Ambulatory Visit: Admitting: Internal Medicine

## 2023-11-11 ENCOUNTER — Encounter: Payer: Self-pay | Admitting: Internal Medicine

## 2023-11-11 VITALS — BP 132/80 | HR 55 | Temp 98.3°F | Ht 69.0 in | Wt 203.0 lb

## 2023-11-11 DIAGNOSIS — Z23 Encounter for immunization: Secondary | ICD-10-CM | POA: Diagnosis not present

## 2023-11-11 DIAGNOSIS — E1165 Type 2 diabetes mellitus with hyperglycemia: Secondary | ICD-10-CM

## 2023-11-11 DIAGNOSIS — I10 Essential (primary) hypertension: Secondary | ICD-10-CM

## 2023-11-11 DIAGNOSIS — Z7984 Long term (current) use of oral hypoglycemic drugs: Secondary | ICD-10-CM | POA: Diagnosis not present

## 2023-11-11 DIAGNOSIS — Z7985 Long-term (current) use of injectable non-insulin antidiabetic drugs: Secondary | ICD-10-CM

## 2023-11-11 DIAGNOSIS — E782 Mixed hyperlipidemia: Secondary | ICD-10-CM

## 2023-11-11 DIAGNOSIS — E559 Vitamin D deficiency, unspecified: Secondary | ICD-10-CM

## 2023-11-11 DIAGNOSIS — I2510 Atherosclerotic heart disease of native coronary artery without angina pectoris: Secondary | ICD-10-CM

## 2023-11-11 MED ORDER — TIRZEPATIDE 7.5 MG/0.5ML ~~LOC~~ SOAJ: 7.5000 mg | SUBCUTANEOUS | 3 refills | Status: DC

## 2023-11-11 NOTE — Patient Instructions (Signed)
 You had the flu shot today  Ok to increase the mounjaro  to 7.5 mg weekly, and let us  know monthly if you need the higher dosing  Please continue all other medications as before, and refills have been done if requested.  Please have the pharmacy call with any other refills you may need.  Please keep your appointments with your specialists as you may have planned  Please make an Appointment to return in 6 months, or sooner if needed, also with Lab Appointment for testing done 3-5 days before at the FIRST FLOOR Lab (so this is for TWO appointments - please see the scheduling desk as you leave)

## 2023-11-11 NOTE — Progress Notes (Unsigned)
 Patient ID: Troy Parsons, male   DOB: 05/30/1960, 63 y.o.   MRN: [id number]        Chief Complaint: follow up HTN, HLD, DM, low vit d, CAD       HPI:  Troy Parsons is a 63 y.o. male here overall doing ok.  Pt denies chest pain, increased sob or doe, wheezing, orthopnea, PND, increased LE swelling, palpitations, dizziness or syncope.   Pt denies polydipsia, polyuria, or new focal neuro s/s.    Due for flu shot.  Has gained several lbs after change to mounjaro  now at 5 mg.  Seen by ortho with right bicep tendonitis, now with PT, likely for steroid injection in 2 wks.  Pt will consider having Card CT score.   Wt Readings from Last 3 Encounters:  11/11/23 203 lb (92.1 kg)  06/12/23 190 lb (86.2 kg)  05/21/23 198 lb (89.8 kg)   BP Readings from Last 3 Encounters:  11/11/23 132/80  06/12/23 132/84  05/21/23 122/80         Past Medical History:  Diagnosis Date   Abnormal liver function    Depression    on meds   Esophageal reflux    on meds   Fatty liver    Gout    Hemorrhoids    Hiatal hernia    Hyperlipemia    on meds   Hypertension    on meds   Seasonal allergies    Type II or unspecified type diabetes mellitus without mention of complication, not stated as uncontrolled    on meds   Past Surgical History:  Procedure Laterality Date   APPENDECTOMY     NASAL SEPTUM SURGERY  2010   UMBILICAL HERNIA REPAIR  2009    reports that he has never smoked. He has never used smokeless tobacco. He reports current alcohol use of about 3.0 - 4.0 standard drinks of alcohol per week. He reports that he does not use drugs. family history includes Cancer in an other family member; Colon cancer in an other family member; Diabetes in his mother and paternal grandfather; Heart disease in an other family member; Hyperlipidemia in an other family member; Hypertension in an other family member; Stroke in an other family member. Allergies  Allergen Reactions   Lipitor [Atorvastatin ] Other (See  Comments)    Increased liver enxymes   Current Outpatient Medications on File Prior to Visit  Medication Sig Dispense Refill   allopurinol  (ZYLOPRIM ) 300 MG tablet TAKE 1 TABLET(300 MG) BY MOUTH DAILY 90 tablet 3   ALPRAZolam  (XANAX ) 0.5 MG tablet Take 1 tablet (0.5 mg total) by mouth 2 (two) times daily as needed. 60 tablet 0   anastrozole (ARIMIDEX) 1 MG tablet Take 1 tablet by mouth once a week.     Ascorbic Acid (VITAMIN C) 500 MG CAPS Take 1 capsule by mouth daily at 6 (six) AM.     aspirin  EC 81 MG tablet Take 1 tablet (81 mg total) by mouth daily. Swallow whole. 30 tablet 12   Cholecalciferol (VITAMIN D ) 50 MCG (2000 UT) tablet Take 2,000 Units by mouth daily.     Continuous Blood Gluc Receiver (FREESTYLE LIBRE 14 DAY READER) DEVI Apply 1 Device topically 4 (four) times daily as needed. E11.9 1 Device 0   Continuous Glucose Sensor (FREESTYLE LIBRE 3 PLUS SENSOR) MISC PLACE 1 SENSOR ON THE SKIN EVERY 14 DAYS 6 each 3   ibuprofen  (ADVIL ) 800 MG tablet Take 1 tablet (800 mg total) by  mouth 2 (two) times daily as needed. 60 tablet 2   irbesartan  (AVAPRO ) 300 MG tablet Take 1 tablet (300 mg total) by mouth daily. 90 tablet 3   levothyroxine  (SYNTHROID ) 25 MCG tablet TAKE 1 TABLET BY MOUTH EVERY DAY 90 tablet 3   mefloquine  (LARIAM ) 250 MG tablet Take 1 tablet (250 mg total) by mouth every 7 (seven) days. Start 1 week prior to travel 6 tablet 0   metFORMIN  (GLUCOPHAGE -XR) 500 MG 24 hr tablet TAKE 4 TABLETS BY MOUTH IN THE MORNING 360 tablet 3   nitroGLYCERIN  (NITRODUR - DOSED IN MG/24 HR) 0.2 mg/hr patch Use 1/4 patch daily to the affected area. 30 patch 1   omeprazole  (PRILOSEC) 20 MG capsule TAKE 1 CAPSULE(20 MG) BY MOUTH TWICE DAILY 180 capsule 3   sildenafil  (VIAGRA ) 100 MG tablet Take 0.5-1 tablets (50-100 mg total) by mouth daily as needed for erectile dysfunction. 10 tablet 11   simvastatin  (ZOCOR ) 40 MG tablet TAKE 1 TABLET(40 MG) BY MOUTH AT BEDTIME 90 tablet 3   tadalafil  (CIALIS ) 20  MG tablet Take 20 mg by mouth daily.     terbinafine  (LAMISIL ) 250 MG tablet Take 1 tablet (250 mg total) by mouth daily. 90 tablet 0   Testosterone Cypionate 200 MG/ML SOLN Inject 1 Dose as directed once a week.     valACYclovir  (VALTREX ) 1000 MG tablet Take 1,000 mg by mouth 2 (two) times daily.     Zinc 100 MG TABS Take 1 tablet by mouth daily at 6 (six) AM.     zolpidem  (AMBIEN ) 10 MG tablet TAKE 1 TABLET(10 MG) BY MOUTH AT BEDTIME AS NEEDED FOR SLEEP 90 tablet 1   No current facility-administered medications on file prior to visit.        ROS:  All others reviewed and negative.  Objective        PE:  BP 132/80 (BP Location: Right Arm, Patient Position: Sitting, Cuff Size: Normal)   Pulse (!) 55   Temp 98.3 F (36.8 C) (Oral)   Ht 5' 9 (1.753 m)   Wt 203 lb (92.1 kg)   SpO2 95%   BMI 29.98 kg/m                 Constitutional: Pt appears in NAD               HENT: Head: NCAT.                Right Ear: External ear normal.                 Left Ear: External ear normal.                Eyes: . Pupils are equal, round, and reactive to light. Conjunctivae and EOM are normal               Nose: without d/c or deformity               Neck: Neck supple. Gross normal ROM               Cardiovascular: Normal rate and regular rhythm.                 Pulmonary/Chest: Effort normal and breath sounds without rales or wheezing.                Abd:  Soft, NT, ND, + BS, no organomegaly  Neurological: Pt is alert. At baseline orientation, motor grossly intact               Skin: Skin is warm. No rashes, no other new lesions, LE edema - none               Psychiatric: Pt behavior is normal without agitation   Micro: none  Cardiac tracings I have personally interpreted today:  none  Pertinent Radiological findings (summarize): none   Lab Results  Component Value Date   WBC 7.4 05/15/2023   HGB 16.4 05/15/2023   HCT 48.9 05/15/2023   PLT 214.0 05/15/2023   GLUCOSE 116 (H)  11/04/2023   CHOL 132 11/04/2023   TRIG 186.0 (H) 11/04/2023   HDL 43.20 11/04/2023   LDLDIRECT 57.0 11/23/2019   LDLCALC 51 11/04/2023   ALT 48 11/04/2023   AST 35 11/04/2023   NA 138 11/04/2023   K 3.9 11/04/2023   CL 101 11/04/2023   CREATININE 0.94 11/04/2023   BUN 13 11/04/2023   CO2 29 11/04/2023   TSH 3.38 05/15/2023   PSA 1.20 05/15/2023   INR 1.0 03/29/2010   HGBA1C 6.7 (H) 11/04/2023   MICROALBUR <0.7 05/15/2023   Assessment/Plan:  Troy Parsons is a 63 y.o. White or Caucasian [1] male with  has a past medical history of Abnormal liver function, Depression, Esophageal reflux, Fatty liver, Gout, Hemorrhoids, Hiatal hernia, Hyperlipemia, Hypertension, Seasonal allergies, and Type II or unspecified type diabetes mellitus without mention of complication, not stated as uncontrolled.  Coronary artery calcification seen on CT scan Pt encouraged for cardiac CT score but declines for now  Vitamin D  deficiency Last vitamin D  Lab Results  Component Value Date   VD25OH 41.04 11/04/2023   Stable, cont oral replacement   Type 2 diabetes mellitus (HCC) Lab Results  Component Value Date   HGBA1C 6.7 (H) 11/04/2023   Uncontrolled with obesity, pt to continue current medical treatment metformin  ER 500 mg - 4 every day, but for increased mounjaro  7.5 mg weekly    Hypertension BP Readings from Last 3 Encounters:  11/11/23 132/80  06/12/23 132/84  05/21/23 122/80   Stable, pt to continue medical treatment avapro  300 mg qd   Mixed hyperlipidemia Lab Results  Component Value Date   LDLCALC 51 11/04/2023   Stable, pt to continue current statin zocor  40 mg qd  Followup: Return in about 6 months (around 05/11/2024).  Troy Rush, MD 11/12/2023 1:24 PM [facility]

## 2023-11-12 ENCOUNTER — Encounter: Payer: Self-pay | Admitting: Internal Medicine

## 2023-11-12 NOTE — Assessment & Plan Note (Signed)
 Lab Results  Component Value Date   LDLCALC 51 11/04/2023   Stable, pt to continue current statin zocor  40 mg qd

## 2023-11-12 NOTE — Assessment & Plan Note (Signed)
 Pt encouraged for cardiac CT score but declines for now

## 2023-11-12 NOTE — Assessment & Plan Note (Signed)
 BP Readings from Last 3 Encounters:  11/11/23 132/80  06/12/23 132/84  05/21/23 122/80   Stable, pt to continue medical treatment avapro  300 mg qd

## 2023-11-12 NOTE — Assessment & Plan Note (Signed)
 Lab Results  Component Value Date   HGBA1C 6.7 (H) 11/04/2023   Uncontrolled with obesity, pt to continue current medical treatment metformin  ER 500 mg - 4 every day, but for increased mounjaro  7.5 mg weekly

## 2023-11-12 NOTE — Assessment & Plan Note (Signed)
 Last vitamin D  Lab Results  Component Value Date   VD25OH 41.04 11/04/2023   Stable, cont oral replacement

## 2023-11-20 ENCOUNTER — Ambulatory Visit: Admitting: Internal Medicine

## 2023-11-23 ENCOUNTER — Encounter: Payer: Self-pay | Admitting: Internal Medicine

## 2023-11-23 DIAGNOSIS — T859XXA Unspecified complication of internal prosthetic device, implant and graft, initial encounter: Secondary | ICD-10-CM

## 2023-11-23 DIAGNOSIS — R109 Unspecified abdominal pain: Secondary | ICD-10-CM

## 2023-11-25 ENCOUNTER — Encounter: Admitting: Internal Medicine

## 2023-12-15 ENCOUNTER — Other Ambulatory Visit: Payer: Self-pay | Admitting: General Surgery

## 2023-12-15 DIAGNOSIS — R1032 Left lower quadrant pain: Secondary | ICD-10-CM

## 2023-12-22 ENCOUNTER — Ambulatory Visit
Admission: RE | Admit: 2023-12-22 | Discharge: 2023-12-22 | Disposition: A | Discharge status: Home / Self Care | Source: Ambulatory Visit | Attending: General Surgery | Admitting: General Surgery

## 2023-12-22 DIAGNOSIS — R1032 Left lower quadrant pain: Secondary | ICD-10-CM

## 2023-12-22 MED ORDER — IOPAMIDOL (ISOVUE-300) INJECTION 61%
100.0000 mL | Freq: Once | INTRAVENOUS | Status: AC | PRN
  Administered 2023-12-22: 100 mL via INTRAVENOUS

## 2023-12-23 ENCOUNTER — Ambulatory Visit: Payer: Self-pay | Admitting: General Surgery

## 2023-12-28 ENCOUNTER — Encounter: Payer: Self-pay | Admitting: Internal Medicine

## 2023-12-28 ENCOUNTER — Other Ambulatory Visit: Payer: Self-pay | Admitting: Internal Medicine

## 2023-12-29 ENCOUNTER — Other Ambulatory Visit: Payer: Self-pay

## 2024-01-24 ENCOUNTER — Encounter: Payer: Self-pay | Admitting: Internal Medicine

## 2024-01-25 MED ORDER — TIRZEPATIDE 10 MG/0.5ML ~~LOC~~ SOAJ: 10.0000 mg | SUBCUTANEOUS | 3 refills | Status: AC

## 2024-02-02 ENCOUNTER — Encounter: Payer: Self-pay | Admitting: Internal Medicine

## 2024-02-02 MED ORDER — ZOLPIDEM TARTRATE 10 MG PO TABS: ORAL_TABLET | ORAL | 1 refills | Status: AC

## 2024-02-02 MED ORDER — LEVOTHYROXINE SODIUM 25 MCG PO TABS: 25.0000 ug | ORAL_TABLET | Freq: Every day | ORAL | 3 refills | Status: AC

## 2024-02-13 ENCOUNTER — Encounter: Payer: Self-pay | Admitting: Internal Medicine

## 2024-02-15 ENCOUNTER — Other Ambulatory Visit: Payer: Self-pay

## 2024-02-15 MED ORDER — FREESTYLE LIBRE 3 PLUS SENSOR MISC: 3 refills | Status: AC

## 2024-02-29 ENCOUNTER — Encounter: Payer: Self-pay | Admitting: Internal Medicine

## 2024-03-01 MED ORDER — SILDENAFIL CITRATE 100 MG PO TABS: 50.0000 mg | ORAL_TABLET | Freq: Every day | ORAL | 11 refills | Status: AC | PRN

## 2024-03-04 ENCOUNTER — Encounter: Payer: Self-pay | Admitting: Internal Medicine

## 2024-03-04 MED ORDER — IBUPROFEN 800 MG PO TABS: 800.0000 mg | ORAL_TABLET | Freq: Two times a day (BID) | ORAL | 2 refills | Status: AC | PRN

## 2024-04-05 ENCOUNTER — Encounter: Admitting: Family Medicine

## 2024-05-11 ENCOUNTER — Ambulatory Visit: Admitting: Internal Medicine
